# Patient Record
Sex: Male | Born: 1950 | Race: Asian | Hispanic: No | Marital: Married | Smoking: Current some day smoker
Health system: Southern US, Community
[De-identification: ages and names within clinical notes are randomized; demographics above are authoritative.]

## PROBLEM LIST (undated history)

## (undated) DIAGNOSIS — I10 Essential (primary) hypertension: Secondary | ICD-10-CM

## (undated) DIAGNOSIS — M5416 Radiculopathy, lumbar region: Secondary | ICD-10-CM

## (undated) DIAGNOSIS — M4800 Spinal stenosis, site unspecified: Secondary | ICD-10-CM

## (undated) DIAGNOSIS — R269 Unspecified abnormalities of gait and mobility: Secondary | ICD-10-CM

## (undated) DIAGNOSIS — E1140 Type 2 diabetes mellitus with diabetic neuropathy, unspecified: Secondary | ICD-10-CM

## (undated) DIAGNOSIS — E119 Type 2 diabetes mellitus without complications: Secondary | ICD-10-CM

## (undated) HISTORY — DX: Essential (primary) hypertension: I10

## (undated) HISTORY — DX: Type 2 diabetes mellitus with diabetic neuropathy, unspecified: E11.40

## (undated) HISTORY — DX: Spinal stenosis, site unspecified: M48.00

## (undated) HISTORY — DX: Type 2 diabetes mellitus without complications: E11.9

## (undated) HISTORY — DX: Unspecified abnormalities of gait and mobility: R26.9

## (undated) HISTORY — DX: Radiculopathy, lumbar region: M54.16

## (undated) HISTORY — PX: NO PAST SURGERIES: SHX2092

---

## 2012-11-29 ENCOUNTER — Ambulatory Visit: Payer: Self-pay

## 2012-12-06 ENCOUNTER — Ambulatory Visit: Payer: No Typology Code available for payment source | Attending: Family Medicine | Admitting: Internal Medicine

## 2012-12-06 ENCOUNTER — Ambulatory Visit: Payer: Self-pay | Attending: Family Medicine

## 2012-12-06 VITALS — BP 125/81 | HR 69 | Temp 98.0°F | Resp 17 | Wt 236.4 lb

## 2012-12-06 DIAGNOSIS — E1110 Type 2 diabetes mellitus with ketoacidosis without coma: Secondary | ICD-10-CM

## 2012-12-06 LAB — CBC WITH DIFFERENTIAL/PLATELET
Basophils Absolute: 0 10*3/uL (ref 0.0–0.1)
Basophils Relative: 0 % (ref 0–1)
Eosinophils Absolute: 0.3 10*3/uL (ref 0.0–0.7)
Eosinophils Relative: 5 % (ref 0–5)
HCT: 39.6 % (ref 39.0–52.0)
Hemoglobin: 13.5 g/dL (ref 13.0–17.0)
Lymphocytes Relative: 26 % (ref 12–46)
Lymphs Abs: 1.6 10*3/uL (ref 0.7–4.0)
MCH: 29.9 pg (ref 26.0–34.0)
MCHC: 34.1 g/dL (ref 30.0–36.0)
MCV: 87.6 fL (ref 78.0–100.0)
Monocytes Absolute: 0.4 10*3/uL (ref 0.1–1.0)
Monocytes Relative: 7 % (ref 3–12)
Neutro Abs: 3.9 10*3/uL (ref 1.7–7.7)
Neutrophils Relative %: 62 % (ref 43–77)
Platelets: 243 10*3/uL (ref 150–400)
RBC: 4.52 MIL/uL (ref 4.22–5.81)
RDW: 14.7 % (ref 11.5–15.5)
WBC: 6.2 10*3/uL (ref 4.0–10.5)

## 2012-12-06 LAB — LIPID PANEL
Cholesterol: 128 mg/dL (ref 0–200)
HDL: 37 mg/dL — ABNORMAL LOW (ref >39)
LDL Cholesterol: 68 mg/dL (ref 0–99)
Total CHOL/HDL Ratio: 3.5 Ratio
Triglycerides: 115 mg/dL (ref <150)
VLDL: 23 mg/dL (ref 0–40)

## 2012-12-06 LAB — POCT GLYCOSYLATED HEMOGLOBIN (HGB A1C): Hemoglobin A1C: 5.9

## 2012-12-06 LAB — HEMOGLOBIN A1C
Hgb A1c MFr Bld: 6.1 % — ABNORMAL HIGH (ref <5.7)
Mean Plasma Glucose: 128 mg/dL — ABNORMAL HIGH (ref <117)

## 2012-12-06 LAB — GLUCOSE, POCT (MANUAL RESULT ENTRY): POC Glucose: 81 mg/dl (ref 70–99)

## 2012-12-06 MED ORDER — LISINOPRIL 10 MG PO TABS: 10.0000 mg | ORAL_TABLET | Freq: Every day | ORAL | Status: DC

## 2012-12-06 MED ORDER — METFORMIN HCL 1000 MG PO TABS: 1000.0000 mg | ORAL_TABLET | Freq: Two times a day (BID) | ORAL | Status: DC

## 2012-12-06 MED ORDER — GLIPIZIDE ER 10 MG PO TB24: 10.0000 mg | ORAL_TABLET | Freq: Two times a day (BID) | ORAL | Status: DC

## 2012-12-06 NOTE — Progress Notes (Unsigned)
Patient has history of HTN and DM Needs medication refill

## 2012-12-06 NOTE — Progress Notes (Unsigned)
Patient ID: Michael Ashley, male   DOB: 1950/07/28, 62 y.o.   MRN: [id number]  CC:  HPI: 62 year old male who is here for establishing care, history of hypertension and diabetes, he needs medication refill. He states that he has been seeing a neurosurgeon in [facility], and was diagnosed with lumbar spinal stenosis. He has pain in his back when he walks, which is improved when he is bending forward. He denies any chest pain any shortness of breath. He wants to try physical therapy, also requesting referral to see a neurosurgeon in [place]  No Known Allergies Past Medical History  Diagnosis Date  . Diabetes mellitus without complication   . Hypertension    No current outpatient prescriptions on file prior to visit.   No current facility-administered medications on file prior to visit.   History reviewed. No pertinent family history. History   Social History  . Marital Status: Married    Spouse Name: N/A    Number of Children: N/A  . Years of Education: N/A   Occupational History  . Not on file.   Social History Main Topics  . Smoking status: Not on file  . Smokeless tobacco: Not on file  . Alcohol Use: Not on file  . Drug Use: Not on file  . Sexually Active: Not on file   Other Topics Concern  . Not on file   Social History Narrative  . No narrative on file    Review of Systems  Constitutional: Negative for fever, chills, diaphoresis, activity change, appetite change and fatigue.  HENT: Negative for ear pain, nosebleeds, congestion, facial swelling, rhinorrhea, neck pain, neck stiffness and ear discharge.   Eyes: Negative for pain, discharge, redness, itching and visual disturbance.  Respiratory: Negative for cough, choking, chest tightness, shortness of breath, wheezing and stridor.   Cardiovascular: Negative for chest pain, palpitations and leg swelling.  Gastrointestinal: Negative for abdominal distention.  Genitourinary: Negative for dysuria, urgency, frequency,  hematuria, flank pain, decreased urine volume, difficulty urinating and dyspareunia.  Musculoskeletal: Negative for back pain, joint swelling, arthralgias and gait problem.  Neurological: Negative for dizziness, tremors, seizures, syncope, facial asymmetry, speech difficulty, weakness, light-headedness, numbness and headaches.  Hematological: Negative for adenopathy. Does not bruise/bleed easily.  Psychiatric/Behavioral: Negative for hallucinations, behavioral problems, confusion, dysphoric mood, decreased concentration and agitation.    Objective:   Filed Vitals:   12/06/12 1047  BP: 125/81  Pulse: 69  Temp: 98 F (36.7 C)  Resp: 17    Physical Exam  Constitutional: Appears well-developed and well-nourished. No distress.  HENT: Normocephalic. External right and left ear normal. Oropharynx is clear and moist.  Eyes: Conjunctivae and EOM are normal. PERRLA, no scleral icterus.  Neck: Normal ROM. Neck supple. No JVD. No tracheal deviation. No thyromegaly.  CVS: RRR, S1/S2 +, no murmurs, no gallops, no carotid bruit.  Pulmonary: Effort and breath sounds normal, no stridor, rhonchi, wheezes, rales.  Abdominal: Soft. BS +,  no distension, tenderness, rebound or guarding.  Musculoskeletal: Normal range of motion. No edema and no tenderness.  Lymphadenopathy: No lymphadenopathy noted, cervical, inguinal. Neuro: Alert. Normal reflexes, muscle tone coordination. No cranial nerve deficit. Skin: Skin is warm and dry. No rash noted. Not diaphoretic. No erythema. No pallor.  Psychiatric: Normal mood and affect. Behavior, judgment, thought content normal.   No results found for this basename: WBC, HGB, HCT, MCV, PLT   No results found for this basename: CREATININE, BUN, NA, K, CL, CO2    Lab Results  Component  Value Date   HGBA1C 5.9% 12/06/2012   Lipid Panel  No results found for this basename: chol, trig, hdl, cholhdl, vldl, ldlcalc       Assessment and plan:   There are no active  problems to display for this patient.      Lumbar spinal stenosis MRI done in [place] Will obtain records Neurosurgery referral Outpatient physical therapy referral  Hypertension Continue lisinopril  Diabetes continue metformin and Glucotrol

## 2012-12-09 LAB — AMBIG ABBREV CMP14 DEFAULT

## 2012-12-10 LAB — COMPREHENSIVE METABOLIC PANEL
ALT: 36 IU/L (ref 0–44)
AST: 38 IU/L (ref 0–40)
Albumin/Globulin Ratio: 1.2 (ref 1.1–2.5)
Albumin: 4.1 g/dL (ref 3.6–4.8)
Alkaline Phosphatase: 64 IU/L (ref 39–117)
BUN/Creatinine Ratio: 22 (ref 10–22)
BUN: 20 mg/dL (ref 8–27)
CO2: 24 mmol/L (ref 18–29)
Calcium: 9.3 mg/dL (ref 8.6–10.2)
Chloride: 103 mmol/L (ref 97–108)
Creatinine, Ser: 0.89 mg/dL (ref 0.76–1.27)
GFR calc Af Amer: 104 mL/min/{1.73_m2} (ref >59)
GFR calc non Af Amer: 90 mL/min/{1.73_m2} (ref >59)
Globulin, Total: 3.4 g/dL (ref 1.5–4.5)
Glucose: 80 mg/dL (ref 65–99)
Potassium: 4.1 mmol/L (ref 3.5–5.2)
Sodium: 140 mmol/L (ref 134–144)
Total Bilirubin: 0.4 mg/dL (ref 0.0–1.2)
Total Protein: 7.5 g/dL (ref 6.0–8.5)

## 2012-12-10 LAB — SPECIMEN STATUS REPORT

## 2012-12-13 ENCOUNTER — Ambulatory Visit: Payer: No Typology Code available for payment source | Attending: Internal Medicine | Admitting: Physical Therapy

## 2012-12-13 DIAGNOSIS — R269 Unspecified abnormalities of gait and mobility: Secondary | ICD-10-CM | POA: Insufficient documentation

## 2012-12-13 DIAGNOSIS — IMO0001 Reserved for inherently not codable concepts without codable children: Secondary | ICD-10-CM | POA: Insufficient documentation

## 2012-12-15 ENCOUNTER — Ambulatory Visit: Payer: No Typology Code available for payment source

## 2012-12-20 ENCOUNTER — Ambulatory Visit: Payer: No Typology Code available for payment source | Attending: Internal Medicine | Admitting: Internal Medicine

## 2012-12-20 VITALS — BP 117/73 | HR 75 | Temp 98.0°F | Resp 16 | Wt 235.2 lb

## 2012-12-20 DIAGNOSIS — E1310 Other specified diabetes mellitus with ketoacidosis without coma: Secondary | ICD-10-CM

## 2012-12-20 DIAGNOSIS — I10 Essential (primary) hypertension: Secondary | ICD-10-CM | POA: Insufficient documentation

## 2012-12-20 DIAGNOSIS — E1110 Type 2 diabetes mellitus with ketoacidosis without coma: Secondary | ICD-10-CM | POA: Insufficient documentation

## 2012-12-20 LAB — GLUCOSE, POCT (MANUAL RESULT ENTRY): POC Glucose: 116 mg/dl — AB (ref 70–99)

## 2012-12-20 NOTE — Progress Notes (Signed)
Patient here for follow up on his Diabetes Blood sugar today is 116

## 2012-12-20 NOTE — Progress Notes (Signed)
Patient ID: Michael Ashley, male   DOB: 11-10-1950, 62 y.o.   MRN: [id number]  CC: Followup  HPI: Patient is a 62 years of pleasant man who came today for followup. He was instructed to call for blood sugar check, on metformin, recent hemoglobin A1c is 6.1. Patient has no complaint today. His back pain is better, he has a neurosurgery appointment in 2 weeks. 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 He does not smoke cigarette, he does not drink alcohol. Minimal exercise due to pain. No Known Allergies Past Medical History  Diagnosis Date  . Diabetes mellitus without complication   . Hypertension    Current Outpatient Prescriptions on File Prior to Visit  Medication Sig Dispense Refill  . glipiZIDE (GLUCOTROL XL) 10 MG 24 hr tablet Take 1 tablet (10 mg total) by mouth 2 (two) times daily.  60 tablet  3  . lisinopril (PRINIVIL,ZESTRIL) 10 MG tablet Take 1 tablet (10 mg total) by mouth daily.  60 tablet  3  . metFORMIN (GLUCOPHAGE) 1000 MG tablet Take 1 tablet (1,000 mg total) by mouth 2 (two) times daily with a meal.  60 tablet  3   No current facility-administered medications on file prior to visit.   History reviewed. No pertinent family history. History   Social History  . Marital Status: Married    Spouse Name: N/A    Number of Children: N/A  . Years of Education: N/A   Occupational History  . Not on file.   Social History Main Topics  . Smoking status: Never Smoker   . Smokeless tobacco: Not on file  . Alcohol Use: Not on file  . Drug Use: Not on file  . Sexual Activity: Not on file   Other Topics Concern  . Not on file   Social History Narrative  . No narrative on file    Review of Systems: Constitutional: Negative for fever, chills, diaphoresis, activity change, appetite change and fatigue. HENT: Negative for ear pain, nosebleeds, congestion, facial swelling, rhinorrhea, neck pain, neck stiffness and ear discharge.  Eyes: Negative for pain, discharge, redness,  itching and visual disturbance. Respiratory: Negative for cough, choking, chest tightness, shortness of breath, wheezing and stridor.  Cardiovascular: Negative for chest pain, palpitations and leg swelling. Gastrointestinal: Negative for abdominal distention. Genitourinary: Negative for dysuria, urgency, frequency, hematuria, flank pain, decreased urine volume, difficulty urinating and dyspareunia.  Musculoskeletal: Negative for back pain, joint swelling, arthralgias and gait problem. Neurological: Negative for dizziness, tremors, seizures, syncope, facial asymmetry, speech difficulty, weakness, light-headedness, numbness and headaches.  Hematological: Negative for adenopathy. Does not bruise/bleed easily. Psychiatric/Behavioral: Negative for hallucinations, behavioral problems, confusion, dysphoric mood, decreased concentration and agitation.    Objective:   Filed Vitals:   12/20/12 1030  BP: 117/73  Pulse: 75  Temp: 98 F (36.7 C)  Resp: 16    Physical Exam: Constitutional: Patient appears well-developed and well-nourished. No distress. HENT: Normocephalic, atraumatic, External right and left ear normal. Oropharynx is clear and moist.  Eyes: Conjunctivae and EOM are normal. PERRLA, no scleral icterus. Neck: Normal ROM. Neck supple. No JVD. No tracheal deviation. No thyromegaly. CVS: RRR, S1/S2 +, no murmurs, no gallops, no carotid bruit.  Pulmonary: Effort and breath sounds normal, no stridor, rhonchi, wheezes, rales.  Abdominal: Soft. BS +,  no distension, tenderness, rebound or guarding.  Musculoskeletal: Normal range of motion. No edema and no tenderness.  Lymphadenopathy: No lymphadenopathy noted, cervical, inguinal or axillary Neuro: Alert. Normal reflexes, muscle tone  coordination. No cranial nerve deficit. Skin: Skin is warm and dry. No rash noted. Not diaphoretic. No erythema. No pallor. Psychiatric: Normal mood and affect. Behavior, judgment, thought content  normal.  Lab Results  Component Value Date   WBC 6.2 12/06/2012   HGB 13.5 12/06/2012   HCT 39.6 12/06/2012   MCV 87.6 12/06/2012   PLT 243 12/06/2012   Lab Results  Component Value Date   CREATININE 0.89 12/06/2012   BUN 20 12/06/2012   NA 140 12/06/2012   K 4.1 12/06/2012   CL 103 12/06/2012   CO2 24 12/06/2012    Lab Results  Component Value Date   HGBA1C 6.1* 12/06/2012   Lipid Panel     Component Value Date/Time   CHOL 128 12/06/2012 1119   TRIG 115 12/06/2012 1119   HDL 37* 12/06/2012 1119   CHOLHDL 3.5 12/06/2012 1119   VLDL 23 12/06/2012 1119   LDLCALC 68 12/06/2012 1119       Assessment and plan:   Patient Active Problem List   Diagnosis Date Noted  . DM (diabetes mellitus) type 2, uncontrolled, with ketoacidosis 12/20/2012  . Essential hypertension, benign 12/20/2012   Patient doing well, encouraged to continue her present regimen of medication Glipizide XL10 mg by mouth daily Lisinopril 10 mg tablet by mouth daily Metformin 1000 mg tablet by mouth twice a day     Patient was counseled on nutrition and exercise  Michael Ashley was given clear instructions to go to ER or return to the clinic if symptoms don't improve, worsen or new problems develop.  Michael Ashley verbalized understanding.  Michael Ashley was told to call to get lab results if hasn't heard anything in the next week.    Jeanann Lewandowsky, MD [facility] [place], [place] [phone]   12/20/2012, 10:59 AM

## 2012-12-22 ENCOUNTER — Ambulatory Visit: Payer: No Typology Code available for payment source

## 2012-12-27 ENCOUNTER — Ambulatory Visit: Payer: No Typology Code available for payment source | Admitting: Rehabilitation

## 2012-12-29 ENCOUNTER — Ambulatory Visit: Payer: No Typology Code available for payment source | Admitting: Rehabilitation

## 2013-01-03 ENCOUNTER — Ambulatory Visit: Payer: No Typology Code available for payment source | Attending: Internal Medicine

## 2013-01-03 DIAGNOSIS — IMO0001 Reserved for inherently not codable concepts without codable children: Secondary | ICD-10-CM | POA: Insufficient documentation

## 2013-01-03 DIAGNOSIS — R269 Unspecified abnormalities of gait and mobility: Secondary | ICD-10-CM | POA: Insufficient documentation

## 2013-01-05 ENCOUNTER — Ambulatory Visit: Payer: No Typology Code available for payment source

## 2013-01-10 ENCOUNTER — Ambulatory Visit: Payer: No Typology Code available for payment source

## 2013-01-12 ENCOUNTER — Ambulatory Visit: Payer: No Typology Code available for payment source | Admitting: Rehabilitation

## 2013-01-19 ENCOUNTER — Ambulatory Visit: Payer: No Typology Code available for payment source

## 2013-03-21 ENCOUNTER — Ambulatory Visit: Payer: No Typology Code available for payment source | Attending: Internal Medicine | Admitting: Internal Medicine

## 2013-03-21 ENCOUNTER — Encounter: Payer: Self-pay | Admitting: Internal Medicine

## 2013-03-21 VITALS — BP 114/76 | HR 80 | Temp 98.3°F | Ht 70.0 in | Wt 240.4 lb

## 2013-03-21 DIAGNOSIS — E1110 Type 2 diabetes mellitus with ketoacidosis without coma: Secondary | ICD-10-CM

## 2013-03-21 DIAGNOSIS — E1310 Other specified diabetes mellitus with ketoacidosis without coma: Secondary | ICD-10-CM

## 2013-03-21 DIAGNOSIS — I10 Essential (primary) hypertension: Secondary | ICD-10-CM

## 2013-03-21 LAB — POCT GLYCOSYLATED HEMOGLOBIN (HGB A1C): Hemoglobin A1C: 6.5

## 2013-03-21 NOTE — Progress Notes (Signed)
Pt is here for office visit on a follow up from his last visit inquiring foot therapy. Pt wants to apply for a handicap sticker.

## 2013-03-21 NOTE — Progress Notes (Signed)
Patient ID: Michael Ashley, male   DOB: 1951/02/03, 62 y.o.   MRN: [id number] Patient Demographics  Michael Ashley, is a 62 y.o. male  [name]  [name]  DOB - 1950/10/23  Chief Complaint  Patient presents with  . office visit    follow up from last visit about foot therapy        Subjective:   Michael Ashley is a 62 y.o. male here today for a follow up visit. Patient has no complaints. Like to get a handicap sticker for packing. He finished physical therapy for his low back pain, doing much better. He walks is still present with [employer], [employer]. He does not smoke cigarette. He does not drink alcohol Patient has No headache, No chest pain, No abdominal pain - No Nausea, No new weakness tingling or numbness, No Cough - SOB.  ALLERGIES: No Known Allergies  PAST MEDICAL HISTORY: Past Medical History  Diagnosis Date  . Diabetes mellitus without complication   . Hypertension     MEDICATIONS AT HOME: Prior to Admission medications   Medication Sig Start Date End Date Taking? Authorizing Provider  glipiZIDE (GLUCOTROL XL) 10 MG 24 hr tablet Take 1 tablet (10 mg total) by mouth 2 (two) times daily. 12/06/12  Yes Richarda Overlie, MD  lisinopril (PRINIVIL,ZESTRIL) 10 MG tablet Take 1 tablet (10 mg total) by mouth daily. 12/06/12  Yes Richarda Overlie, MD  metFORMIN (GLUCOPHAGE) 1000 MG tablet Take 1 tablet (1,000 mg total) by mouth 2 (two) times daily with a meal. 12/06/12  Yes Richarda Overlie, MD     Objective:   Filed Vitals:   03/21/13 0958  BP: 114/76  Pulse: 80  Temp: 98.3 F (36.8 C)  TempSrc: Oral  Height: 5\' 10"  (1.778 m)  Weight: 240 lb 6.4 oz (109.045 kg)  SpO2: 96%    Exam General appearance : Awake, alert, not in any distress. Speech Clear. Not toxic looking, obese HEENT: Atraumatic and Normocephalic, pupils equally reactive to light and accomodation Neck: supple, no JVD. No cervical lymphadenopathy.  Chest:Good air entry bilaterally, no added sounds  CVS: S1 S2  regular, no murmurs.  Abdomen: Bowel sounds present, Non tender and not distended with no gaurding, rigidity or rebound. Extremities: B/L Lower Ext shows no edema, both legs are warm to touch Neurology: Awake alert, and oriented X 3, CN II-XII intact, Non focal Skin:No Rash Wounds:N/A   Data Review   CBC No results found for this basename: WBC, HGB, HCT, PLT, MCV, MCH, MCHC, RDW, NEUTRABS, LYMPHSABS, MONOABS, EOSABS, BASOSABS, BANDABS, BANDSABD,  in the last 168 hours  Chemistries   No results found for this basename: NA, K, CL, CO2, GLUCOSE, BUN, CREATININE, GFRCGP, CALCIUM, MG, AST, ALT, ALKPHOS, BILITOT,  in the last 168 hours ------------------------------------------------------------------------------------------------------------------ No results found for this basename: HGBA1C,  in the last 72 hours ------------------------------------------------------------------------------------------------------------------ No results found for this basename: CHOL, HDL, LDLCALC, TRIG, CHOLHDL, LDLDIRECT,  in the last 72 hours ------------------------------------------------------------------------------------------------------------------ No results found for this basename: TSH, T4TOTAL, FREET3, T3FREE, THYROIDAB,  in the last 72 hours ------------------------------------------------------------------------------------------------------------------ No results found for this basename: VITAMINB12, FOLATE, FERRITIN, TIBC, IRON, RETICCTPCT,  in the last 72 hours  Coagulation profile  No results found for this basename: INR, PROTIME,  in the last 168 hours    Assessment & Plan   Patient Active Problem List   Diagnosis Date Noted  . DM (diabetes mellitus) type 2, uncontrolled, with ketoacidosis 12/20/2012  . Essential hypertension, benign 12/20/2012   Hemoglobin A1c today  is 6.5 slightly higher than 3 months ago Patient claims blood sugar at home ranges between 90 and  130  Plan: Continue glipizide 10 mg tablet by mouth daily Continue metformin 1000 mg tablet by mouth twice a day  Current lisinopril 10 mg tablet by mouth daily  Patient again counseled extensively about nutrition and exercise  Health Maintenance -Colonoscopy: Patient declined today, saying he will try next appointment -Vaccinations:  -Influenza already given  Follow up in 3-6 months or when necessary  The patient was given clear instructions to go to ER or return to medical center if symptoms don't improve, worsen or new problems develop. The patient verbalized understanding. The patient was told to call to get lab results if they haven't heard anything in the next week.    Jeanann Lewandowsky, MD, MHA, FACP, FAAP [facility] [place], [place] [phone]   03/21/2013, 10:23 AM

## 2013-03-21 NOTE — Patient Instructions (Signed)
DASH Diet The DASH diet stands for "Dietary Approaches to Stop Hypertension." It is a healthy eating plan that has been shown to reduce high blood pressure (hypertension) in as little as 14 days, while also possibly providing other significant health benefits. These other health benefits include reducing the risk of breast cancer after menopause and reducing the risk of type 2 diabetes, heart disease, colon cancer, and stroke. Health benefits also include weight loss and slowing kidney failure in patients with chronic kidney disease.  DIET GUIDELINES  Limit salt (sodium). Your diet should contain less than 1500 mg of sodium daily.  Limit refined or processed carbohydrates. Your diet should include mostly whole grains. Desserts and added sugars should be used sparingly.  Include small amounts of heart-healthy fats. These types of fats include nuts, oils, and tub margarine. Limit saturated and trans fats. These fats have been shown to be harmful in the body. CHOOSING FOODS  The following food groups are based on a 2000 calorie diet. See your Registered Dietitian for individual calorie needs. Grains and Grain Products (6 to 8 servings daily)  Eat More Often: Whole-wheat bread, brown rice, whole-grain or wheat pasta, quinoa, popcorn without added fat or salt (air popped).  Eat Less Often: White bread, white pasta, white rice, cornbread. Vegetables (4 to 5 servings daily)  Eat More Often: Fresh, frozen, and canned vegetables. Vegetables may be raw, steamed, roasted, or grilled with a minimal amount of fat.  Eat Less Often/Avoid: Creamed or fried vegetables. Vegetables in a cheese sauce. Fruit (4 to 5 servings daily)  Eat More Often: All fresh, canned (in natural juice), or frozen fruits. Dried fruits without added sugar. One hundred percent fruit juice ( cup [237 mL] daily).  Eat Less Often: Dried fruits with added sugar. Canned fruit in light or heavy syrup. Lean Meats, Fish, and Poultry (2  servings or less daily. One serving is 3 to 4 oz [85-114 g]).  Eat More Often: Ninety percent or leaner ground beef, tenderloin, sirloin. Round cuts of beef, chicken breast, turkey breast. All fish. Grill, bake, or broil your meat. Nothing should be fried.  Eat Less Often/Avoid: Fatty cuts of meat, turkey, or chicken leg, thigh, or wing. Fried cuts of meat or fish. Dairy (2 to 3 servings)  Eat More Often: Low-fat or fat-free milk, low-fat plain or light yogurt, reduced-fat or part-skim cheese.  Eat Less Often/Avoid: Milk (whole, 2%).Whole milk yogurt. Full-fat cheeses. Nuts, Seeds, and Legumes (4 to 5 servings per week)  Eat More Often: All without added salt.  Eat Less Often/Avoid: Salted nuts and seeds, canned beans with added salt. Fats and Sweets (limited)  Eat More Often: Vegetable oils, tub margarines without trans fats, sugar-free gelatin. Mayonnaise and salad dressings.  Eat Less Often/Avoid: Coconut oils, palm oils, butter, stick margarine, cream, half and half, cookies, candy, pie. FOR MORE INFORMATION The Dash Diet Eating Plan: www.dashdiet.org Document Released: 04/09/2011 Document Revised: 07/13/2011 Document Reviewed: 04/09/2011 ExitCare Patient Information 2014 [employer]. Diabetes and Exercise Exercising regularly is important. It is not just about losing weight. It has many health benefits, such as:  Improving your overall fitness, flexibility, and endurance.  Increasing your bone density.  Helping with weight control.  Decreasing your body fat.  Increasing your muscle strength.  Reducing stress and tension.  Improving your overall health. People with diabetes who exercise gain additional benefits because exercise:  Reduces appetite.  Improves the body's use of blood sugar (glucose).  Helps lower or control blood glucose.    Decreases blood pressure.  Helps control blood lipids (such as cholesterol and triglycerides).  Improves the body's use  of the hormone insulin by:  Increasing the body's insulin sensitivity.  Reducing the body's insulin needs.  Decreases the risk for heart disease because exercising:  Lowers cholesterol and triglycerides levels.  Increases the levels of good cholesterol (such as high-density lipoproteins [HDL]) in the body.  Lowers blood glucose levels. YOUR ACTIVITY PLAN  Choose an activity that you enjoy and set realistic goals. Your health care provider or diabetes educator can help you make an activity plan that works for you. You can break activities into 2 or 3 sessions throughout the day. Doing so is as good as one long session. Exercise ideas include:  Taking the dog for a walk.  Taking the stairs instead of the elevator.  Dancing to your favorite song.  Doing your favorite exercise with a friend. RECOMMENDATIONS FOR EXERCISING WITH TYPE 1 OR TYPE 2 DIABETES   Check your blood glucose before exercising. If blood glucose levels are greater than 240 mg/dL, check for urine ketones. Do not exercise if ketones are present.  Avoid injecting insulin into areas of the body that are going to be exercised. For example, avoid injecting insulin into:  The arms when playing tennis.  The legs when jogging.  Keep a record of:  Food intake before and after you exercise.  Expected peak times of insulin action.  Blood glucose levels before and after you exercise.  The type and amount of exercise you have done.  Review your records with your health care provider. Your health care provider will help you to develop guidelines for adjusting food intake and insulin amounts before and after exercising.  If you take insulin or oral hypoglycemic agents, watch for signs and symptoms of hypoglycemia. They include:  Dizziness.  Shaking.  Sweating.  Chills.  Confusion.  Drink plenty of water while you exercise to prevent dehydration or heat stroke. Body water is lost during exercise and must be  replaced.  Talk to your health care provider before starting an exercise program to make sure it is safe for you. Remember, almost any type of activity is better than none. Document Released: 07/11/2003 Document Revised: 12/21/2012 Document Reviewed: 09/27/2012 ExitCare Patient Information 2014 [employer].  

## 2013-03-22 ENCOUNTER — Ambulatory Visit: Payer: No Typology Code available for payment source

## 2013-05-25 ENCOUNTER — Ambulatory Visit: Payer: No Typology Code available for payment source | Attending: Internal Medicine

## 2013-08-22 ENCOUNTER — Ambulatory Visit: Payer: Self-pay | Admitting: Physician Assistant

## 2013-08-22 ENCOUNTER — Telehealth: Payer: Self-pay | Admitting: *Deleted

## 2013-08-22 ENCOUNTER — Other Ambulatory Visit (INDEPENDENT_AMBULATORY_CARE_PROVIDER_SITE_OTHER): Payer: No Typology Code available for payment source

## 2013-08-22 ENCOUNTER — Ambulatory Visit (INDEPENDENT_AMBULATORY_CARE_PROVIDER_SITE_OTHER): Payer: No Typology Code available for payment source | Admitting: Internal Medicine

## 2013-08-22 ENCOUNTER — Encounter: Payer: Self-pay | Admitting: Internal Medicine

## 2013-08-22 VITALS — BP 128/70 | HR 70 | Temp 97.3°F | Resp 16 | Ht 70.0 in | Wt 246.0 lb

## 2013-08-22 DIAGNOSIS — I10 Essential (primary) hypertension: Secondary | ICD-10-CM

## 2013-08-22 DIAGNOSIS — M48062 Spinal stenosis, lumbar region with neurogenic claudication: Secondary | ICD-10-CM | POA: Insufficient documentation

## 2013-08-22 DIAGNOSIS — E1165 Type 2 diabetes mellitus with hyperglycemia: Principal | ICD-10-CM

## 2013-08-22 DIAGNOSIS — E785 Hyperlipidemia, unspecified: Secondary | ICD-10-CM

## 2013-08-22 DIAGNOSIS — E118 Type 2 diabetes mellitus with unspecified complications: Secondary | ICD-10-CM | POA: Insufficient documentation

## 2013-08-22 DIAGNOSIS — M48061 Spinal stenosis, lumbar region without neurogenic claudication: Secondary | ICD-10-CM

## 2013-08-22 DIAGNOSIS — IMO0001 Reserved for inherently not codable concepts without codable children: Secondary | ICD-10-CM

## 2013-08-22 DIAGNOSIS — Z23 Encounter for immunization: Secondary | ICD-10-CM

## 2013-08-22 DIAGNOSIS — G4700 Insomnia, unspecified: Secondary | ICD-10-CM

## 2013-08-22 LAB — BASIC METABOLIC PANEL
BUN: 17 mg/dL (ref 6–23)
CO2: 28 mEq/L (ref 19–32)
Calcium: 9.5 mg/dL (ref 8.4–10.5)
Chloride: 102 mEq/L (ref 96–112)
Creatinine, Ser: 1.1 mg/dL (ref 0.4–1.5)
GFR: 75.4 mL/min (ref >60.00)
Glucose, Bld: 96 mg/dL (ref 70–99)
Potassium: 4.2 mEq/L (ref 3.5–5.1)
Sodium: 139 mEq/L (ref 135–145)

## 2013-08-22 LAB — URINALYSIS, ROUTINE W REFLEX MICROSCOPIC
Bilirubin Urine: NEGATIVE
Hgb urine dipstick: NEGATIVE
Ketones, ur: NEGATIVE
Leukocytes, UA: NEGATIVE
Nitrite: NEGATIVE
Specific Gravity, Urine: 1.025 (ref 1.000–1.030)
Total Protein, Urine: NEGATIVE
Urine Glucose: NEGATIVE
Urobilinogen, UA: 0.2 (ref 0.0–1.0)
pH: 5 (ref 5.0–8.0)

## 2013-08-22 LAB — TSH: TSH: 2.25 u[IU]/mL (ref 0.35–5.50)

## 2013-08-22 LAB — HEMOGLOBIN A1C: Hgb A1c MFr Bld: 7.1 % — ABNORMAL HIGH (ref 4.6–6.5)

## 2013-08-22 NOTE — Telephone Encounter (Signed)
Which pharmacy?

## 2013-08-22 NOTE — Progress Notes (Signed)
Subjective:    Patient ID: Michael Ashley, male    DOB: [birth year], 63 y.o.   MRN: [id number]  HPI Comments: New to me. He has a congenital lumbar spinal cord disorder that is causing him to have weakness and incoordination in his legs and feet. He saw a neurosurgeon in [place] nearly a year ago and was told that he should have surgery to repair this. He asks me to see him to someone for a second opinion.  Diabetes He presents for his follow-up diabetic visit. He has type 2 diabetes mellitus. His disease course has been stable. There are no hypoglycemic associated symptoms. Associated symptoms include weakness (in his legs and feet). Pertinent negatives for diabetes include no blurred vision, no chest pain, no fatigue, no foot paresthesias, no foot ulcerations, no polydipsia, no polyphagia, no polyuria, no visual change and no weight loss. There are no hypoglycemic complications. There are no diabetic complications. Current diabetic treatment includes oral agent (dual therapy). He is compliant with treatment all of the time. He is following a generally healthy diet. Meal planning includes avoidance of concentrated sweets. He participates in exercise intermittently. There is no change in his home blood glucose trend. An ACE inhibitor/angiotensin II receptor blocker is being taken. He does not see a podiatrist.Eye exam is not current.      Review of Systems  Constitutional: Negative for fever, chills, weight loss, diaphoresis, appetite change and fatigue.  HENT: Negative.   Eyes: Negative.  Negative for blurred vision.  Respiratory: Negative.  Negative for cough, choking, chest tightness, shortness of breath and stridor.   Cardiovascular: Negative.  Negative for chest pain, palpitations and leg swelling.  Gastrointestinal: Negative.  Negative for nausea, vomiting, abdominal pain, diarrhea, constipation and blood in stool.  Endocrine: Negative.  Negative for polydipsia, polyphagia and polyuria.    Genitourinary: Negative.   Musculoskeletal: Negative.  Negative for arthralgias, back pain, gait problem, joint swelling, myalgias, neck pain and neck stiffness.  Skin: Negative.   Allergic/Immunologic: Negative.   Neurological: Positive for weakness (in his legs and feet).  Hematological: Negative.  Negative for adenopathy. Does not bruise/bleed easily.  Psychiatric/Behavioral: Negative.        Objective:   Physical Exam  Vitals reviewed. Constitutional: He is oriented to person, place, and time. He appears well-developed and well-nourished. No distress.  HENT:  Head: Normocephalic and atraumatic.  Mouth/Throat: Oropharynx is clear and moist. No oropharyngeal exudate.  Eyes: Conjunctivae are normal. Right eye exhibits no discharge. Left eye exhibits no discharge. No scleral icterus.  Neck: Normal range of motion. Neck supple. No JVD present. No tracheal deviation present. No thyromegaly present.  Cardiovascular: Normal rate, regular rhythm, normal heart sounds and intact distal pulses.  Exam reveals no gallop and no friction rub.   No murmur heard. Pulmonary/Chest: Effort normal and breath sounds normal. No stridor. No respiratory distress. He has no wheezes. He has no rales. He exhibits no tenderness.  Abdominal: Soft. Bowel sounds are normal. He exhibits no distension and no mass. There is no tenderness. There is no rebound and no guarding.  Musculoskeletal: Normal range of motion. He exhibits no edema and no tenderness.  Lymphadenopathy:    He has no cervical adenopathy.  Neurological: He is alert and oriented to person, place, and time. He displays atrophy. He displays no tremor and normal reflexes. No cranial nerve deficit or sensory deficit. He exhibits abnormal muscle tone. He displays a negative Romberg sign. He displays no seizure activity. Gait abnormal. Coordination  normal.  Reflex Scores:      Tricep reflexes are 1+ on the right side and 1+ on the left side.      Bicep  reflexes are 1+ on the right side and 1+ on the left side.      Brachioradialis reflexes are 1+ on the right side and 1+ on the left side.      Patellar reflexes are 1+ on the right side and 1+ on the left side.      Achilles reflexes are 1+ on the right side and 1+ on the left side. He walks in his heels. There is mild weakness in both legs and feet.  Skin: Skin is warm and dry. No rash noted. He is not diaphoretic. No erythema. No pallor.     Lab Results  Component Value Date   WBC 6.2 12/06/2012   HGB 13.5 12/06/2012   HCT 39.6 12/06/2012   PLT 243 12/06/2012   GLUCOSE 80 12/06/2012   CHOL 128 12/06/2012   TRIG 115 12/06/2012   HDL 37* 12/06/2012   LDLCALC 68 12/06/2012   ALT 36 12/06/2012   AST 38 12/06/2012   NA 140 12/06/2012   K 4.1 12/06/2012   CL 103 12/06/2012   CREATININE 0.89 12/06/2012   BUN 20 12/06/2012   CO2 24 12/06/2012   HGBA1C 6.5 03/21/2013       Assessment & Plan:

## 2013-08-22 NOTE — Telephone Encounter (Signed)
Pt called states he was to believe he was going to have a Rx for a sleep aide after OV.  Please advise

## 2013-08-22 NOTE — Patient Instructions (Signed)
Type 2 Diabetes Mellitus, Adult Type 2 diabetes mellitus, often simply referred to as type 2 diabetes, is a long-lasting (chronic) disease. In type 2 diabetes, the pancreas does not make enough insulin (a hormone), the cells are less responsive to the insulin that is made (insulin resistance), or both. Normally, insulin moves sugars from food into the tissue cells. The tissue cells use the sugars for energy. The lack of insulin or the lack of normal response to insulin causes excess sugars to build up in the blood instead of going into the tissue cells. As a result, high blood sugar (hyperglycemia) develops. The effect of high sugar (glucose) levels can cause many complications. Type 2 diabetes was also previously called adult-onset diabetes but it can occur at any age.  RISK FACTORS  A person is predisposed to developing type 2 diabetes if someone in the family has the disease and also has one or more of the following primary risk factors:  Overweight.  An inactive lifestyle.  A history of consistently eating high-calorie foods. Maintaining a normal weight and regular physical activity can reduce the chance of developing type 2 diabetes. SYMPTOMS  A person with type 2 diabetes may not show symptoms initially. The symptoms of type 2 diabetes appear slowly. The symptoms include:  Increased thirst (polydipsia).  Increased urination (polyuria).  Increased urination during the night (nocturia).  Weight loss. This weight loss may be rapid.  Frequent, recurring infections.  Tiredness (fatigue).  Weakness.  Vision changes, such as blurred vision.  Fruity smell to your breath.  Abdominal pain.  Nausea or vomiting.  Cuts or bruises which are slow to heal.  Tingling or numbness in the hands or feet. DIAGNOSIS Type 2 diabetes is frequently not diagnosed until complications of diabetes are present. Type 2 diabetes is diagnosed when symptoms or complications are present and when blood  glucose levels are increased. Your blood glucose level may be checked by one or more of the following blood tests:  A fasting blood glucose test. You will not be allowed to eat for at least 8 hours before a blood sample is taken.  A random blood glucose test. Your blood glucose is checked at any time of the day regardless of when you ate.  A hemoglobin A1c blood glucose test. A hemoglobin A1c test provides information about blood glucose control over the previous 3 months.  An oral glucose tolerance test (OGTT). Your blood glucose is measured after you have not eaten (fasted) for 2 hours and then after you drink a glucose-containing beverage. TREATMENT   You may need to take insulin or diabetes medicine daily to keep blood glucose levels in the desired range.  You will need to match insulin dosing with exercise and healthy food choices. The treatment goal is to maintain the before meal blood sugar (preprandial glucose) level at 70 130 mg/dL. HOME CARE INSTRUCTIONS   Have your hemoglobin A1c level checked twice a year.  Perform daily blood glucose monitoring as directed by your caregiver.  Monitor urine ketones when you are ill and as directed by your caregiver.  Take your diabetes medicine or insulin as directed by your caregiver to maintain your blood glucose levels in the desired range.  Never run out of diabetes medicine or insulin. It is needed every day.  Adjust insulin based on your intake of carbohydrates. Carbohydrates can raise blood glucose levels but need to be included in your diet. Carbohydrates provide vitamins, minerals, and fiber which are an essential part of   a healthy diet. Carbohydrates are found in fruits, vegetables, whole grains, dairy products, legumes, and foods containing added sugars.    Eat healthy foods. Alternate 3 meals with 3 snacks.  Lose weight if overweight.  Carry a medical alert card or wear your medical alert jewelry.  Carry a 15 gram  carbohydrate snack with you at all times to treat low blood glucose (hypoglycemia). Some examples of 15 gram carbohydrate snacks include:  Glucose tablets, 3 or 4   Glucose gel, 15 gram tube  Raisins, 2 tablespoons (24 grams)  Jelly beans, 6  Animal crackers, 8  Regular pop, 4 ounces (120 mL)  Gummy treats, 9  Recognize hypoglycemia. Hypoglycemia occurs with blood glucose levels of 70 mg/dL and below. The risk for hypoglycemia increases when fasting or skipping meals, during or after intense exercise, and during sleep. Hypoglycemia symptoms can include:  Tremors or shakes.  Decreased ability to concentrate.  Sweating.  Increased heart rate.  Headache.  Dry mouth.  Hunger.  Irritability.  Anxiety.  Restless sleep.  Altered speech or coordination.  Confusion.  Treat hypoglycemia promptly. If you are alert and able to safely swallow, follow the 15:15 rule:  Take 15 20 grams of rapid-acting glucose or carbohydrate. Rapid-acting options include glucose gel, glucose tablets, or 4 ounces (120 mL) of fruit juice, regular soda, or low fat milk.  Check your blood glucose level 15 minutes after taking the glucose.  Take 15 20 grams more of glucose if the repeat blood glucose level is still 70 mg/dL or below.  Eat a meal or snack within 1 hour once blood glucose levels return to normal.    Be alert to polyuria and polydipsia which are early signs of hyperglycemia. An early awareness of hyperglycemia allows for prompt treatment. Treat hyperglycemia as directed by your caregiver.  Engage in at least 150 minutes of moderate-intensity physical activity a week, spread over at least 3 days of the week or as directed by your caregiver. In addition, you should engage in resistance exercise at least 2 times a week or as directed by your caregiver.  Adjust your medicine and food intake as needed if you start a new exercise or sport.  Follow your sick day plan at any time you  are unable to eat or drink as usual.  Avoid tobacco use.  Limit alcohol intake to no more than 1 drink per day for nonpregnant women and 2 drinks per day for men. You should drink alcohol only when you are also eating food. Talk with your caregiver whether alcohol is safe for you. Tell your caregiver if you drink alcohol several times a week.  Follow up with your caregiver regularly.  Schedule an eye exam soon after the diagnosis of type 2 diabetes and then annually.  Perform daily skin and foot care. Examine your skin and feet daily for cuts, bruises, redness, nail problems, bleeding, blisters, or sores. A foot exam by a caregiver should be done annually.  Brush your teeth and gums at least twice a day and floss at least once a day. Follow up with your dentist regularly.  Share your diabetes management plan with your workplace or school.  Stay up-to-date with immunizations.  Learn to manage stress.  Obtain ongoing diabetes education and support as needed.  Participate in, or seek rehabilitation as needed to maintain or improve independence and quality of life. Request a physical or occupational therapy referral if you are having foot or hand numbness or difficulties with grooming,   dressing, eating, or physical activity. SEEK MEDICAL CARE IF:   You are unable to eat food or drink fluids for more than 6 hours.  You have nausea and vomiting for more than 6 hours.  Your blood glucose level is over 240 mg/dL.  There is a change in mental status.  You develop an additional serious illness.  You have diarrhea for more than 6 hours.  You have been sick or have had a fever for a couple of days and are not getting better.  You have pain during any physical activity.  SEEK IMMEDIATE MEDICAL CARE IF:  You have difficulty breathing.  You have moderate to large ketone levels. MAKE SURE YOU:  Understand these instructions.  Will watch your condition.  Will get help right away if  you are not doing well or get worse. Document Released: 04/20/2005 Document Revised: 01/13/2012 Document Reviewed: 11/17/2011 [employer] Patient Information 2014 [employer].  

## 2013-08-22 NOTE — Progress Notes (Signed)
Pre visit review using our clinic review tool, if applicable. No additional management support is needed unless otherwise documented below in the visit note. 

## 2013-08-22 NOTE — Assessment & Plan Note (Signed)
He wants to get a second opinion

## 2013-08-23 ENCOUNTER — Telehealth: Payer: Self-pay | Admitting: Internal Medicine

## 2013-08-23 DIAGNOSIS — G4700 Insomnia, unspecified: Secondary | ICD-10-CM | POA: Insufficient documentation

## 2013-08-23 MED ORDER — DOXEPIN HCL 10 MG PO CAPS: 10.0000 mg | ORAL_CAPSULE | Freq: Every day | ORAL | Status: DC

## 2013-08-23 NOTE — Assessment & Plan Note (Signed)
His BP is well controlled I will check his lytes and renal function 

## 2013-08-23 NOTE — Addendum Note (Signed)
Addended by: Etta GrandchildJONES, Giulia Hickey L on: 08/23/2013 04:06 PM   Modules accepted: Orders

## 2013-08-23 NOTE — Telephone Encounter (Signed)
Relevant patient education assigned to patient using Emmi. ° °

## 2013-08-23 NOTE — Addendum Note (Signed)
Addended by: Etta GrandchildJONES, Valeree Leidy L on: 08/23/2013 04:05 PM   Modules accepted: Orders

## 2013-08-23 NOTE — Assessment & Plan Note (Signed)
He has achieved his LDL goal 

## 2013-08-23 NOTE — Assessment & Plan Note (Signed)
I will check his A1C today and will advise further

## 2013-08-23 NOTE — Telephone Encounter (Signed)
Spoke with pt he says he uses [facility].

## 2013-09-12 ENCOUNTER — Other Ambulatory Visit: Payer: Self-pay | Admitting: Neurosurgery

## 2013-09-12 DIAGNOSIS — M5136 Other intervertebral disc degeneration, lumbar region: Secondary | ICD-10-CM

## 2013-09-14 ENCOUNTER — Other Ambulatory Visit (INDEPENDENT_AMBULATORY_CARE_PROVIDER_SITE_OTHER): Payer: No Typology Code available for payment source

## 2013-09-14 ENCOUNTER — Ambulatory Visit (INDEPENDENT_AMBULATORY_CARE_PROVIDER_SITE_OTHER): Payer: No Typology Code available for payment source | Admitting: Family Medicine

## 2013-09-14 VITALS — BP 120/72 | HR 74

## 2013-09-14 DIAGNOSIS — M65311 Trigger thumb, right thumb: Secondary | ICD-10-CM | POA: Insufficient documentation

## 2013-09-14 DIAGNOSIS — M79609 Pain in unspecified limb: Secondary | ICD-10-CM

## 2013-09-14 DIAGNOSIS — M6530 Trigger finger, unspecified finger: Secondary | ICD-10-CM

## 2013-09-14 DIAGNOSIS — M79644 Pain in right finger(s): Secondary | ICD-10-CM

## 2013-09-14 NOTE — Assessment & Plan Note (Signed)
Patient was given an injection today. Patient was put in a splint to wear for the next 72 hours and nightly for 2 weeks. We discussed icing protocol as well as massage. Patient was given a topical anti-inflammatory to try as well for any breakthrough pain. Patient will follow up in 2 weeks for further evaluation. Patient may need a repeat injection would like to wait 6 weeks in between injections.

## 2013-09-14 NOTE — Patient Instructions (Signed)
Very nice to meet you Injection today to help Wear brace day and night for next 3 days then nightly for next 2 weeks Ice 20 minutes 1-2 times a day can help.  Message it during the day.  If pain put topical on 2 times daily Come back in 2 weeks.

## 2013-09-16 ENCOUNTER — Encounter: Payer: Self-pay | Admitting: Family Medicine

## 2013-09-16 NOTE — Progress Notes (Signed)
  Tawana ScaleZach Suraya Vidrine D.O. [facility] [address] [place], [place] [address] Phone: [phone] Subjective:    I'm seeing this patient by the request  of:  Sanda Lingerhomas Jones, MD   CC: right thumb pain  BJY:NWGNFAOZHYHPI:Subjective Michael Ashley is a 63 y.o. male coming in with complaint of right thumb pain. Patient states he has had this for multiple months and seems to be getting worse. Patient states that it seems to get stuck in certain positions and does cause a lot of pain to straighten out. Patient sometimes has to use his other hand to straighten him out. Patient does not do any repetitive movement but does work on a mouse for long extended period of time. Patient denies any numbness or any weakness. Denies any injury to the area. Patient describes it as more of a sharp pain. Denies any discoloration of the skin. Denies any nighttime awakening the severity of 7/10.    Past medical history, social, surgical and family history all reviewed in electronic medical record.   Review of Systems: No headache, visual changes, nausea, vomiting, diarrhea, constipation, dizziness, abdominal pain, skin rash, fevers, chills, night sweats, weight loss, swollen lymph nodes, body aches, joint swelling, muscle aches, chest pain, shortness of breath, mood changes.   Objective Blood pressure 120/72, pulse 74, SpO2 96.00%.  General: No apparent distress alert and oriented x3 mood and affect normal, dressed appropriately.  HEENT: Pupils equal, extraocular movements intact  Respiratory: Patient's speak in full sentences and does not appear short of breath  Cardiovascular: No lower extremity edema, non tender, no erythema  Skin: Warm dry intact with no signs of infection or rash on extremities or on axial skeleton.  Abdomen: Soft nontender  Neuro: Cranial nerves II through XII are intact, neurovascularly intact in all extremities with 2+ DTRs and 2+ pulses.  Lymph: No lymphadenopathy of posterior or anterior cervical chain  or axillae bilaterally.  Gait normal with good balance and coordination.  MSK:  Non tender with full range of motion and good stability and symmetric strength and tone of shoulders, elbows, wrist, hip, knee and ankles bilaterally.  Hand exam shows the patient does have a nodule within the tendon sheath of the abductor pollicis longus tendon going over the A1 pulley. This is moderately tender to palpation. Neurovascularly intact distally with good capillary refill.   Limited musculoskeletal ultrasound was performed and interpreted by Judi SaaZachary M Orvis Stann Limited ultrasound shows the patient does have a nodule within the tendon sheath corresponds well with trigger finger. Impression: Trigger finger of the A1 pulley on the abductor pollicis longus tendon.  Procedure note After verbal consent patient was prepped with alcohol swabs and with a 25-gauge 5 inch needle under ultrasound guidance was injected with her 0.5 cc of 0.5% Marcaine and 0.5 cc of Kenalog 40 mg/dL. Patient tolerated the procedure well without any significant difficulty. Patient still had triggering the pain had resolved. Patient was splinted and post injection instructions were given.   Impression and Recommendations:     This case required medical decision making of moderate complexity.

## 2013-09-28 ENCOUNTER — Ambulatory Visit: Payer: No Typology Code available for payment source | Admitting: Family Medicine

## 2013-10-03 ENCOUNTER — Ambulatory Visit (INDEPENDENT_AMBULATORY_CARE_PROVIDER_SITE_OTHER): Payer: No Typology Code available for payment source | Admitting: Family Medicine

## 2013-10-03 ENCOUNTER — Encounter: Payer: Self-pay | Admitting: Family Medicine

## 2013-10-03 VITALS — BP 108/64 | HR 68 | Ht 69.5 in | Wt 242.0 lb

## 2013-10-03 DIAGNOSIS — M65311 Trigger thumb, right thumb: Secondary | ICD-10-CM

## 2013-10-03 DIAGNOSIS — M6530 Trigger finger, unspecified finger: Secondary | ICD-10-CM

## 2013-10-03 NOTE — Patient Instructions (Signed)
Use brace when you need it Message during the day can help See me when you need me.

## 2013-10-03 NOTE — Assessment & Plan Note (Signed)
Patient is doing extremely better after injection could in 3 weeks ago. Patient will do manual massage we discussed bracing as needed as well as icing. Patient has long is doing well can followup on an as-needed basis.

## 2013-10-03 NOTE — Progress Notes (Signed)
  [name] [address] [place], [place] [address] Phone: [phone] Subjective:     CC: right thumb pain follow up.   HYQ:MVHQIONGEX Michael Ashley is a 63 y.o. male coming in with complaint of right thumb pain. Patient was seen previously and did have a trigger thumb. Patient does have injection within the A1 pulley. Patient states since that time he has not had any pain. Patient states he still triggers somewhat but he is able to move it on its own. States that everyday it seems to be getting better. Does not affect any of his daily activities. He is resting comfortably. Wears the brace when he can.    Past medical history, social, surgical and family history all reviewed in electronic medical record.   Review of Systems: No headache, visual changes, nausea, vomiting, diarrhea, constipation, dizziness, abdominal pain, skin rash, fevers, chills, night sweats, weight loss, swollen lymph nodes, body aches, joint swelling, muscle aches, chest pain, shortness of breath, mood changes.   Objective Blood pressure 108/64, pulse 68, height 5' 9.5" (1.765 m), weight 242 lb (109.77 kg), SpO2 98.00%.  General: No apparent distress alert and oriented x3 mood and affect normal, dressed appropriately.  HEENT: Pupils equal, extraocular movements intact  Respiratory: Patient's speak in full sentences and does not appear short of breath  Cardiovascular: No lower extremity edema, non tender, no erythema  Skin: Warm dry intact with no signs of infection or rash on extremities or on axial skeleton.  Abdomen: Soft nontender  Neuro: Cranial nerves II through XII are intact, neurovascularly intact in all extremities with 2+ DTRs and 2+ pulses.  Lymph: No lymphadenopathy of posterior or anterior cervical chain or axillae bilaterally.  Gait normal with good balance and coordination.  MSK:  Non tender with full range of motion and good stability and symmetric strength and tone of  shoulders, elbows, wrist, hip, knee and ankles bilaterally.  Hand exam shows the patient does have a nodule within the tendon sheath of the abductor pollicis longus tendon significantly smaller than previous exam and nontender on exam.     Impression and Recommendations:     This case required medical decision making of moderate complexity.

## 2013-10-05 ENCOUNTER — Other Ambulatory Visit: Payer: Self-pay

## 2013-10-05 DIAGNOSIS — G4700 Insomnia, unspecified: Secondary | ICD-10-CM

## 2013-10-05 MED ORDER — LISINOPRIL 10 MG PO TABS: 10.0000 mg | ORAL_TABLET | Freq: Every day | ORAL | Status: DC

## 2013-10-05 MED ORDER — METFORMIN HCL 1000 MG PO TABS: 1000.0000 mg | ORAL_TABLET | Freq: Two times a day (BID) | ORAL | Status: DC

## 2013-10-05 MED ORDER — DOXEPIN HCL 10 MG PO CAPS: 10.0000 mg | ORAL_CAPSULE | Freq: Every day | ORAL | Status: DC

## 2013-10-05 MED ORDER — GLIPIZIDE ER 10 MG PO TB24: 10.0000 mg | ORAL_TABLET | Freq: Two times a day (BID) | ORAL | Status: DC

## 2013-10-05 NOTE — Telephone Encounter (Signed)
Corrected rx.

## 2013-11-13 ENCOUNTER — Other Ambulatory Visit: Payer: No Typology Code available for payment source

## 2013-11-14 ENCOUNTER — Ambulatory Visit
Admission: RE | Admit: 2013-11-14 | Discharge: 2013-11-14 | Disposition: A | Discharge status: Home / Self Care | Payer: No Typology Code available for payment source | Source: Ambulatory Visit | Attending: Neurosurgery | Admitting: Neurosurgery

## 2013-11-14 VITALS — BP 105/67 | HR 64

## 2013-11-14 DIAGNOSIS — M48061 Spinal stenosis, lumbar region without neurogenic claudication: Secondary | ICD-10-CM

## 2013-11-14 DIAGNOSIS — M5136 Other intervertebral disc degeneration, lumbar region: Secondary | ICD-10-CM

## 2013-11-14 MED ORDER — DIAZEPAM 5 MG PO TABS
10.0000 mg | ORAL_TABLET | Freq: Once | ORAL | Status: AC
  Administered 2013-11-14: 10 mg via ORAL

## 2013-11-14 MED ORDER — IOHEXOL 180 MG/ML  SOLN
15.0000 mL | Freq: Once | INTRAMUSCULAR | Status: AC | PRN
  Administered 2013-11-14: 15 mL via INTRATHECAL

## 2013-11-14 NOTE — Discharge Instructions (Signed)

## 2014-04-13 ENCOUNTER — Telehealth: Payer: Self-pay | Admitting: Internal Medicine

## 2014-04-13 MED ORDER — METFORMIN HCL 1000 MG PO TABS: 1000.0000 mg | ORAL_TABLET | Freq: Two times a day (BID) | ORAL | Status: DC

## 2014-04-13 NOTE — Telephone Encounter (Signed)
Pt called needs medication sent to  [facility], Metformin. [id number] (pt gave this #, not sure if needed).

## 2014-07-12 ENCOUNTER — Ambulatory Visit (INDEPENDENT_AMBULATORY_CARE_PROVIDER_SITE_OTHER): Payer: No Typology Code available for payment source | Admitting: Internal Medicine

## 2014-07-12 ENCOUNTER — Encounter: Payer: Self-pay | Admitting: Internal Medicine

## 2014-07-12 ENCOUNTER — Other Ambulatory Visit (INDEPENDENT_AMBULATORY_CARE_PROVIDER_SITE_OTHER): Payer: No Typology Code available for payment source

## 2014-07-12 VITALS — BP 120/78 | HR 70 | Temp 98.3°F | Resp 16 | Ht 69.5 in | Wt 239.0 lb

## 2014-07-12 DIAGNOSIS — Z0000 Encounter for general adult medical examination without abnormal findings: Secondary | ICD-10-CM

## 2014-07-12 DIAGNOSIS — N400 Enlarged prostate without lower urinary tract symptoms: Secondary | ICD-10-CM

## 2014-07-12 DIAGNOSIS — I10 Essential (primary) hypertension: Secondary | ICD-10-CM

## 2014-07-12 DIAGNOSIS — M48061 Spinal stenosis, lumbar region without neurogenic claudication: Secondary | ICD-10-CM

## 2014-07-12 DIAGNOSIS — E785 Hyperlipidemia, unspecified: Secondary | ICD-10-CM

## 2014-07-12 DIAGNOSIS — E118 Type 2 diabetes mellitus with unspecified complications: Secondary | ICD-10-CM

## 2014-07-12 DIAGNOSIS — Z1159 Encounter for screening for other viral diseases: Secondary | ICD-10-CM | POA: Insufficient documentation

## 2014-07-12 DIAGNOSIS — Z1211 Encounter for screening for malignant neoplasm of colon: Secondary | ICD-10-CM

## 2014-07-12 LAB — CBC WITH DIFFERENTIAL/PLATELET
Basophils Absolute: 0 10*3/uL (ref 0.0–0.1)
Basophils Relative: 0.4 % (ref 0.0–3.0)
Eosinophils Absolute: 0.4 10*3/uL (ref 0.0–0.7)
Eosinophils Relative: 5.4 % — ABNORMAL HIGH (ref 0.0–5.0)
HCT: 42.4 % (ref 39.0–52.0)
Hemoglobin: 14.6 g/dL (ref 13.0–17.0)
Lymphocytes Relative: 22.1 % (ref 12.0–46.0)
Lymphs Abs: 1.6 10*3/uL (ref 0.7–4.0)
MCHC: 34.4 g/dL (ref 30.0–36.0)
MCV: 88.4 fl (ref 78.0–100.0)
Monocytes Absolute: 0.5 10*3/uL (ref 0.1–1.0)
Monocytes Relative: 6.6 % (ref 3.0–12.0)
Neutro Abs: 4.7 10*3/uL (ref 1.4–7.7)
Neutrophils Relative %: 65.5 % (ref 43.0–77.0)
Platelets: 259 10*3/uL (ref 150.0–400.0)
RBC: 4.79 Mil/uL (ref 4.22–5.81)
RDW: 13.9 % (ref 11.5–15.5)
WBC: 7.1 10*3/uL (ref 4.0–10.5)

## 2014-07-12 LAB — COMPREHENSIVE METABOLIC PANEL
ALT: 22 U/L (ref 0–53)
AST: 21 U/L (ref 0–37)
Albumin: 4.4 g/dL (ref 3.5–5.2)
Alkaline Phosphatase: 75 U/L (ref 39–117)
BUN: 18 mg/dL (ref 6–23)
CO2: 27 mEq/L (ref 19–32)
Calcium: 9.6 mg/dL (ref 8.4–10.5)
Chloride: 103 mEq/L (ref 96–112)
Creatinine, Ser: 1.04 mg/dL (ref 0.40–1.50)
GFR: 76.03 mL/min (ref >60.00)
Glucose, Bld: 124 mg/dL — ABNORMAL HIGH (ref 70–99)
Potassium: 4.1 mEq/L (ref 3.5–5.1)
Sodium: 136 mEq/L (ref 135–145)
Total Bilirubin: 0.6 mg/dL (ref 0.2–1.2)
Total Protein: 7.8 g/dL (ref 6.0–8.3)

## 2014-07-12 LAB — LIPID PANEL
Cholesterol: 145 mg/dL (ref 0–200)
HDL: 39.1 mg/dL (ref >39.00)
LDL Cholesterol: 83 mg/dL (ref 0–99)
NonHDL: 105.9
Total CHOL/HDL Ratio: 4
Triglycerides: 116 mg/dL (ref 0.0–149.0)
VLDL: 23.2 mg/dL (ref 0.0–40.0)

## 2014-07-12 LAB — MICROALBUMIN / CREATININE URINE RATIO
Creatinine,U: 78.7 mg/dL
Microalb Creat Ratio: 1.1 mg/g (ref 0.0–30.0)
Microalb, Ur: 0.9 mg/dL (ref 0.0–1.9)

## 2014-07-12 LAB — URINALYSIS, ROUTINE W REFLEX MICROSCOPIC
Bilirubin Urine: NEGATIVE
Hgb urine dipstick: NEGATIVE
Ketones, ur: NEGATIVE
Leukocytes, UA: NEGATIVE
Nitrite: NEGATIVE
RBC / HPF: NONE SEEN (ref 0–?)
Specific Gravity, Urine: 1.025 (ref 1.000–1.030)
Total Protein, Urine: NEGATIVE
Urine Glucose: NEGATIVE
Urobilinogen, UA: 0.2 (ref 0.0–1.0)
WBC, UA: NONE SEEN (ref 0–?)
pH: 5.5 (ref 5.0–8.0)

## 2014-07-12 LAB — HEMOGLOBIN A1C: Hgb A1c MFr Bld: 7.4 % — ABNORMAL HIGH (ref 4.6–6.5)

## 2014-07-12 LAB — PSA: PSA: 1.21 ng/mL (ref 0.10–4.00)

## 2014-07-12 LAB — TSH: TSH: 3.03 u[IU]/mL (ref 0.35–4.50)

## 2014-07-12 MED ORDER — GLIPIZIDE ER 10 MG PO TB24: 10.0000 mg | ORAL_TABLET | Freq: Two times a day (BID) | ORAL | Status: DC

## 2014-07-12 MED ORDER — LISINOPRIL 10 MG PO TABS: 10.0000 mg | ORAL_TABLET | Freq: Every day | ORAL | Status: DC

## 2014-07-12 MED ORDER — METFORMIN HCL 1000 MG PO TABS: 1000.0000 mg | ORAL_TABLET | Freq: Two times a day (BID) | ORAL | Status: DC

## 2014-07-12 NOTE — Assessment & Plan Note (Signed)
He has no s.s that need to be treated Will check a PSA to screen for cancer

## 2014-07-12 NOTE — Progress Notes (Signed)
Pre visit review using our clinic review tool, if applicable. No additional management support is needed unless otherwise documented below in the visit note. 

## 2014-07-12 NOTE — Assessment & Plan Note (Signed)
I have asked him to f/up with Dr. Jeral FruitBotero about this

## 2014-07-12 NOTE — Assessment & Plan Note (Signed)
His BP is well controlled Will monitor his lytes and renal function 

## 2014-07-12 NOTE — Patient Instructions (Signed)

## 2014-07-12 NOTE — Assessment & Plan Note (Signed)
He was referred for an eye exam Will recheck his A1C and will monitor his renal function

## 2014-07-12 NOTE — Assessment & Plan Note (Signed)

## 2014-07-12 NOTE — Progress Notes (Signed)
Subjective:    Patient ID: Michael Ashley, male    DOB: 06/08/1950, 64 y.o.   MRN: [id number]  Diabetes He presents for his follow-up diabetic visit. He has type 2 diabetes mellitus. His disease course has been stable. There are no hypoglycemic associated symptoms. Pertinent negatives for hypoglycemia include no dizziness. There are no diabetic associated symptoms. Pertinent negatives for diabetes include no blurred vision, no chest pain, no fatigue, no foot paresthesias, no foot ulcerations, no polydipsia, no polyphagia, no polyuria, no visual change and no weight loss. There are no hypoglycemic complications. Symptoms are stable. There are no diabetic complications. Current diabetic treatment includes oral agent (dual therapy). His weight is stable. He is following a generally unhealthy diet. When asked about meal planning, he reported none. He has not had a previous visit with a dietitian. He participates in exercise intermittently. There is no change in his home blood glucose trend. An ACE inhibitor/angiotensin II receptor blocker is being taken. He does not see a podiatrist.Eye exam is not current.      Review of Systems  Constitutional: Negative.  Negative for fever, weight loss, diaphoresis, appetite change and fatigue.  HENT: Negative.   Eyes: Negative.  Negative for blurred vision.  Respiratory: Negative.  Negative for cough, choking, chest tightness, shortness of breath and stridor.   Cardiovascular: Negative.  Negative for chest pain, palpitations and leg swelling.  Gastrointestinal: Negative.  Negative for nausea, vomiting, abdominal pain, diarrhea, constipation and blood in stool.  Endocrine: Negative.  Negative for polydipsia, polyphagia and polyuria.  Genitourinary: Negative.   Musculoskeletal: Positive for back pain. Negative for myalgias, joint swelling, arthralgias, gait problem, neck pain and neck stiffness.  Skin: Negative.  Negative for rash.  Allergic/Immunologic: Negative.     Neurological: Negative.  Negative for dizziness.  Hematological: Negative.  Negative for adenopathy. Does not bruise/bleed easily.  Psychiatric/Behavioral: Negative.        Objective:   Physical Exam  Constitutional: He is oriented to person, place, and time. He appears well-developed and well-nourished. No distress.  HENT:  Head: Normocephalic and atraumatic.  Mouth/Throat: Oropharynx is clear and moist. No oropharyngeal exudate.  Eyes: Conjunctivae are normal. Right eye exhibits no discharge. Left eye exhibits no discharge. No scleral icterus.  Neck: Normal range of motion. Neck supple. No JVD present. No tracheal deviation present. No thyromegaly present.  Cardiovascular: Normal rate, regular rhythm, normal heart sounds and intact distal pulses.  Exam reveals no gallop and no friction rub.   No murmur heard. Pulmonary/Chest: Effort normal and breath sounds normal. No stridor. No respiratory distress. He has no wheezes. He has no rales. He exhibits no tenderness.  Abdominal: Soft. Bowel sounds are normal. He exhibits no distension and no mass. There is no tenderness. There is no rebound and no guarding. Hernia confirmed negative in the right inguinal area and confirmed negative in the left inguinal area.  Genitourinary: Rectum normal, testes normal and penis normal. Rectal exam shows no external hemorrhoid, no internal hemorrhoid, no fissure, no mass, no tenderness and anal tone normal. Guaiac negative stool. Prostate is enlarged (1+ smooth symm BPH). Prostate is not tender. Right testis shows no mass, no swelling and no tenderness. Right testis is descended. Left testis shows no mass, no swelling and no tenderness. Left testis is descended. Circumcised. No penile erythema or penile tenderness. No discharge found.  Musculoskeletal: Normal range of motion. He exhibits no edema or tenderness.  Lymphadenopathy:    He has no cervical adenopathy.  Right: No inguinal adenopathy present.        Left: No inguinal adenopathy present.  Neurological: He is oriented to person, place, and time.  Skin: Skin is warm and dry. No rash noted. He is not diaphoretic. No erythema. No pallor.  Psychiatric: He has a normal mood and affect. His behavior is normal. Judgment and thought content normal.  Vitals reviewed.    Lab Results  Component Value Date   WBC 6.2 12/06/2012   HGB 13.5 12/06/2012   HCT 39.6 12/06/2012   PLT 243 12/06/2012   GLUCOSE 96 08/22/2013   CHOL 128 12/06/2012   TRIG 115 12/06/2012   HDL 37* 12/06/2012   LDLCALC 68 12/06/2012   ALT 36 12/06/2012   AST 38 12/06/2012   NA 139 08/22/2013   K 4.2 08/22/2013   CL 102 08/22/2013   CREATININE 1.1 08/22/2013   BUN 17 08/22/2013   CO2 28 08/22/2013   TSH 2.25 08/22/2013   HGBA1C 7.1* 08/22/2013       Assessment & Plan:

## 2014-08-14 ENCOUNTER — Encounter: Payer: Self-pay | Admitting: Internal Medicine

## 2014-08-16 ENCOUNTER — Encounter: Payer: Self-pay | Admitting: Internal Medicine

## 2014-09-08 IMAGING — CT CT L SPINE W/ CM
4 of 10 series · 13 of 33 positions shown, 14 images · non-contrast
Comparison: MRI lumbar spine 10/18/2012.

CLINICAL DATA: Lower extremity weakness. Difficulty and extending
at the ankle joints.
TECHNIQUE: Contiguous axial images were obtained through the Lumbar spine after
the intrathecal infusion of infusion. Coronal and sagittal
reconstructions were obtained of the axial image sets.

[Series 2: l spine bone · axial · 0.27mm/px · z∈[-274,-186]mm · 2 of 105 slices shown, 3 images]
[im 35/105  soft-tissue]
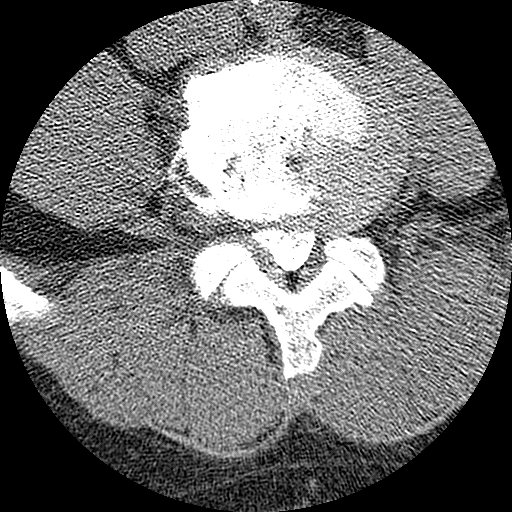
[im 35/105  bone]
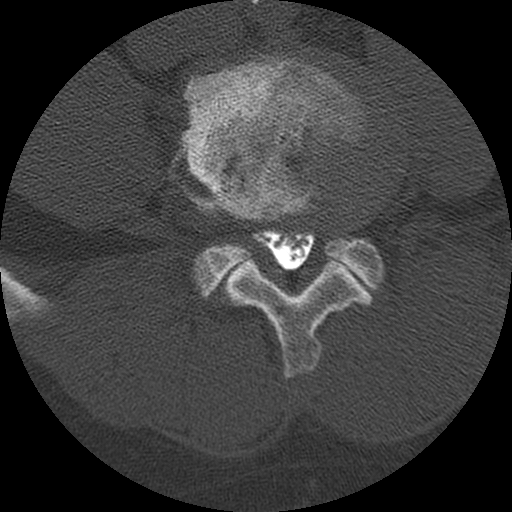
[im 70/105  bone]
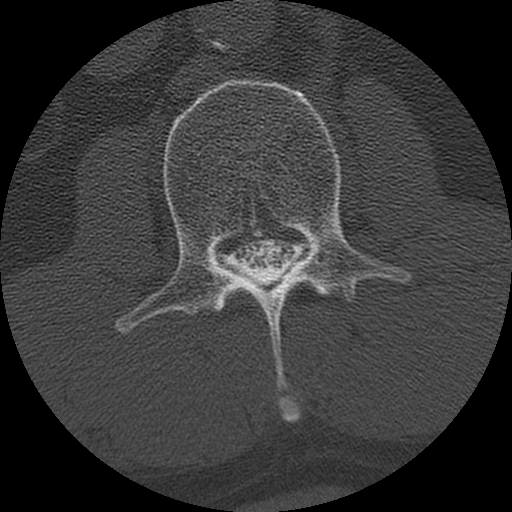

[Series 3: l spine soft · axial · 0.27mm/px · z∈[-294,-164]mm · 3 of 105 slices shown]
[im 27/105  soft-tissue]
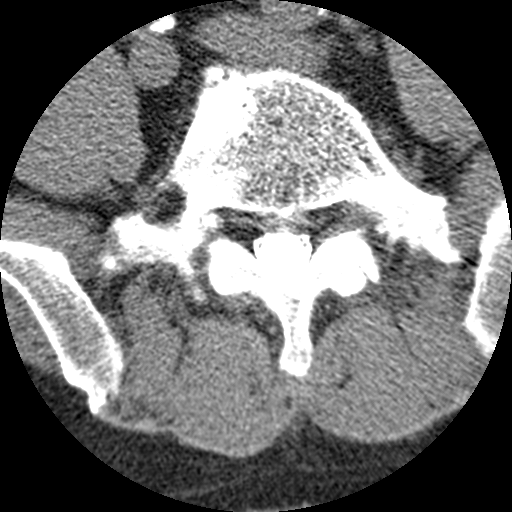
[im 53/105  soft-tissue]
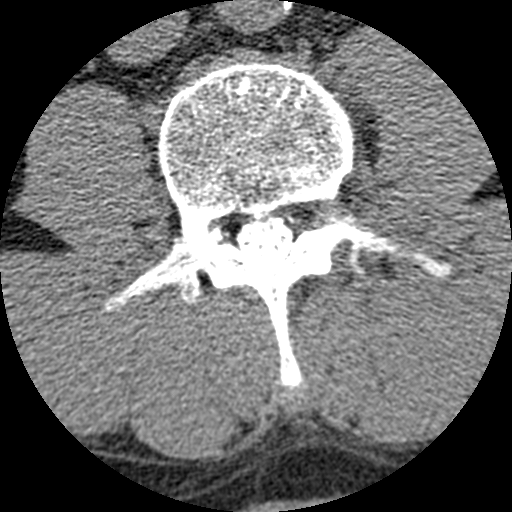
[im 79/105  soft-tissue]
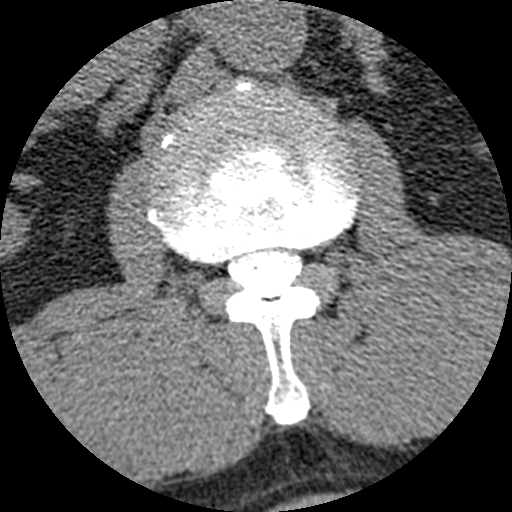

[Series 104: cor/lower · coronal · 0.31mm/px · 3 of 50 slices shown]
[im 10/50  bone]
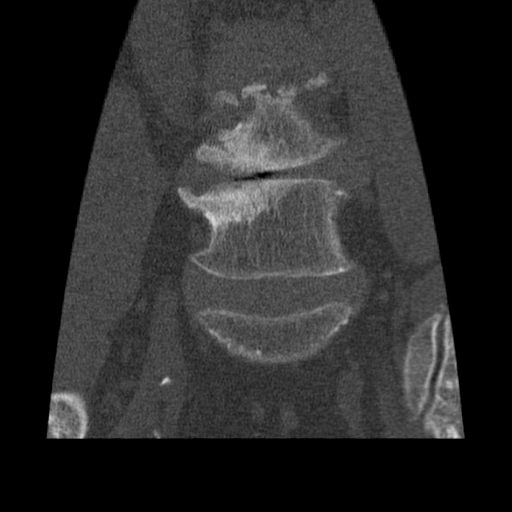
[im 20/50  bone]
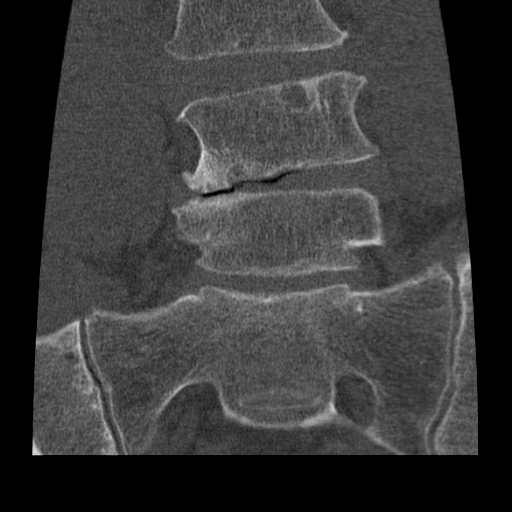
[im 30/50  bone]
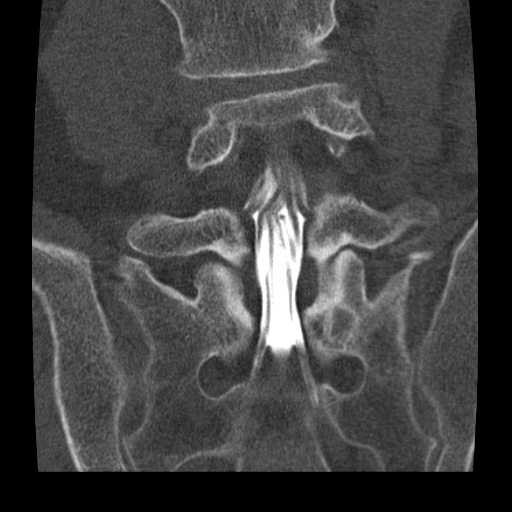

[Series 105: sag · sagittal · 0.52mm/px · 5 of 57 slices shown]
[im 10/57  bone]
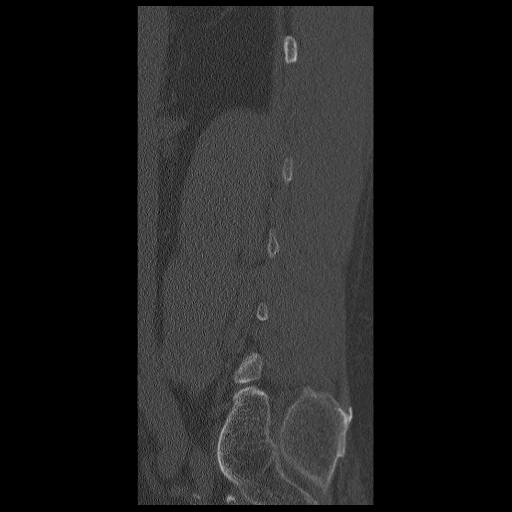
[im 19/57  bone]
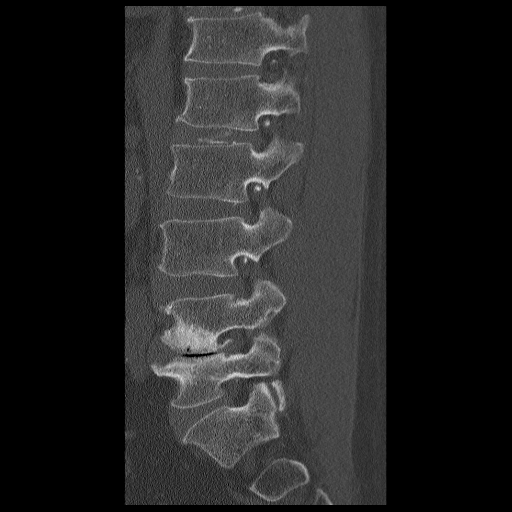
[im 29/57  bone]
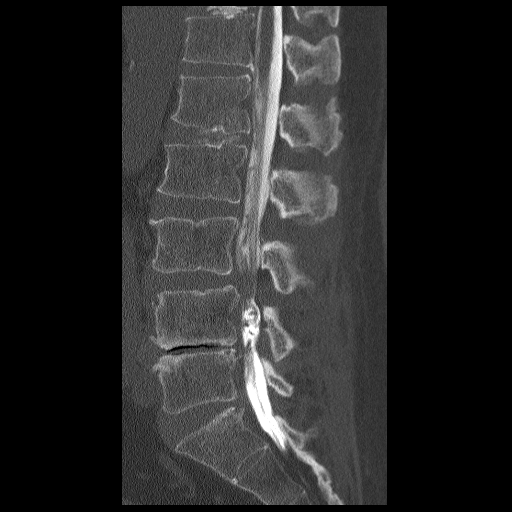
[im 38/57  bone]
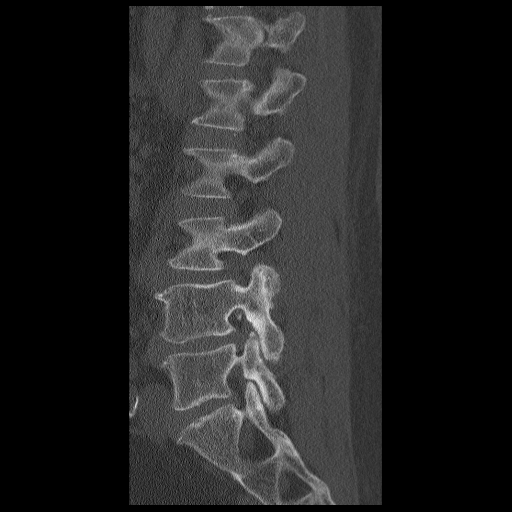
[im 47/57  bone]
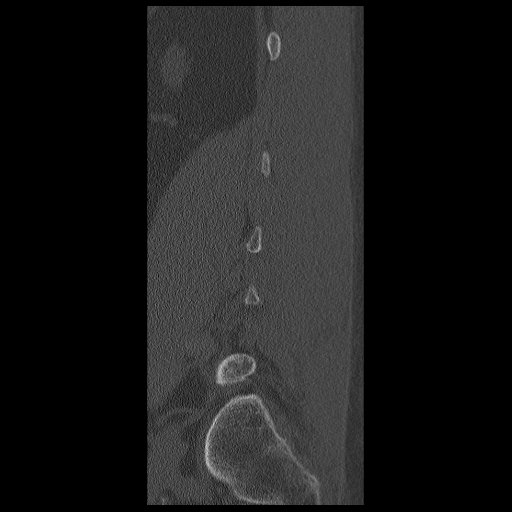

[13 of 33 positions shown; findings below may reference images not displayed]

EXAM:
LUMBAR MYELOGRAM

FLUOROSCOPY TIME:  1 min 45 seconds

PROCEDURE:
After thorough discussion of risks and benefits of the procedure
including bleeding, infection, injury to nerves, blood vessels,
adjacent structures as well as headache and CSF leak, written and
oral informed consent was obtained. Consent was obtained by Dr.
Heynner Sando. Time out form was completed.

Patient was positioned prone on the fluoroscopy table. Local
anesthesia was provided with 1% lidocaine without epinephrine after
prepped and draped in the usual sterile fashion. Puncture was
performed at L2-3 using a 3 1/2 inch 22-gauge spinal needle via
right paramedian approach. Using a single pass through the dura, the
needle was placed within the thecal sac, with return of clear CSF.
15 mL of Kmnipaque-[id number] was injected into the thecal sac, with normal
opacification of the nerve roots and cauda equina consistent with
free flow within the subarachnoid space.

I personally performed the lumbar puncture and administered the
intrathecal contrast. I also personally supervised acquisition of
the myelogram images.
FINDINGS: LUMBAR MYELOGRAM FINDINGS:

Five non rib-bearing lumbar type vertebral bodies are present.
Moderate central canal stenosis is present at L3-4 with lateral
recess narrowing bilaterally, right greater than left. There is mild
leftward curvature of the lumbar spine at L4-5. Mild lateral recess
narrowing is present at L4-5 is well with a broad-based disc
protrusion. Mild disc bulging is present at L5-S1 without
significant stenosis.

Mild disc bulging is present at L2-3 with minimal left lateral
recess narrowing.

Endplate marrow changes are present on the right at L4-5.

The flexion images demonstrate exacerbation of slight
anterolisthesis at L3-4. This is reduced in extension.

CT LUMBAR MYELOGRAM FINDINGS:

The lumbar spine is imaged from T11-12 through S3. The conus
medullaris terminates at L1, within normal limits. There is partial
calcification of the L1-2 disc. A vacuum disc is present at L4-5
with chronic endplate sclerotic changes on the right. Focal leftward
curvature is present in the lumbar spine at L4-5.

Limited imaging of the abdomen demonstrates atherosclerotic
calcifications in the aorta without aneurysm.

L1-2: A mild broad-based disc bulge is present. There is no
significant focal protrusion or stenosis.

L2-3: A broad-based disc bulge is present. Asymmetric left-sided
facet hypertrophy and ligamentum flavum thickening results in
minimal left lateral recess narrowing. The foramina are patent.

L3-4: Moderate facet degenerative changes are present. Ligamentum
flavum thickening is noted. There is slight anterolisthesis with
uncovering of a broad-based disc protrusion. This results in
moderate lateral recess narrowing bilaterally. Moderate left and
mild right foraminal stenosis is present.

L4-5: A leftward disc protrusion is associated with the vocal
scoliosis. Facet hypertrophy and ligamentum flavum thickening is
present. Mild to moderate left lateral recess and bilateral
foraminal stenosis is present.

L5-S1: A shallow central disc protrusion is present without
significant stenosis.
IMPRESSION: LUMBAR MYELOGRAM IMPRESSION:

1. Moderate central canal stenosis at L3-4 with slight
anterolisthesis exaggerated by flexion.
2. Mild lateral recess narrowing at L4-5 secondary to a broad-based
disc protrusion
3. Mild left lateral recess narrowing at L2-3.

CT LUMBAR MYELOGRAM IMPRESSION:

1. Minimal left lateral recess narrowing at L2-3 secondary to a
broad-based disc bulge and left-sided and ligamentum flavum
thickening.
2. Moderate lateral recess narrowing bilaterally with moderate left
and mild right foraminal stenosis at L3-4.
3. Mild to moderate left lateral recess and bilateral foraminal
stenosis at L4-5.
4. Shallow central disc protrusion without significant stenosis at
L5-S1.

## 2014-11-15 ENCOUNTER — Telehealth: Payer: Self-pay | Admitting: Internal Medicine

## 2014-11-15 DIAGNOSIS — I10 Essential (primary) hypertension: Secondary | ICD-10-CM

## 2014-11-15 DIAGNOSIS — E118 Type 2 diabetes mellitus with unspecified complications: Secondary | ICD-10-CM

## 2014-11-15 MED ORDER — METFORMIN HCL 1000 MG PO TABS: 1000.0000 mg | ORAL_TABLET | Freq: Two times a day (BID) | ORAL | Status: DC

## 2014-11-15 MED ORDER — LISINOPRIL 10 MG PO TABS: 10.0000 mg | ORAL_TABLET | Freq: Every day | ORAL | Status: DC

## 2014-11-15 MED ORDER — GLIPIZIDE ER 10 MG PO TB24
10.0000 mg | ORAL_TABLET | Freq: Two times a day (BID) | ORAL | Status: DC
Start: 2014-11-15 — End: 2015-10-10

## 2014-11-15 NOTE — Telephone Encounter (Signed)
Patient is requesting refill on lisinopril, glipizide and metformin to be sent to [facility].

## 2015-05-06 MED FILL — ?LISINOPRIL 10 MG TABLET: 10 | 30 days supply | Qty: 30 | Fill #4

## 2015-06-06 MED FILL — metFORMIN HCL 1000 MG TABS: 1000 | 30 days supply | Qty: 60 | Fill #3

## 2015-06-10 MED FILL — ?LISINOPRIL 10 MG TABLET: 10 | 30 days supply | Qty: 30 | Fill #5

## 2015-07-01 MED FILL — ?LISINOPRIL 10 MG TABLET: 10 | 30 days supply | Qty: 30 | Fill #6

## 2015-07-01 MED FILL — ?METFORMIN HCL 1,000 MG TAB: 1000 | 30 days supply | Qty: 60 | Fill #4

## 2015-07-18 ENCOUNTER — Other Ambulatory Visit: Payer: Self-pay | Admitting: *Deleted

## 2015-07-18 DIAGNOSIS — E118 Type 2 diabetes mellitus with unspecified complications: Secondary | ICD-10-CM

## 2015-07-18 MED ORDER — METFORMIN HCL 1000 MG PO TABS: 1000.0000 mg | ORAL_TABLET | Freq: Two times a day (BID) | ORAL | Status: DC

## 2015-07-18 NOTE — Telephone Encounter (Signed)
Left msg on triage requesting refill on his metformin. Pt is due for yearly CPX sent 30 day until appt....Raechel Chute/lmb

## 2015-07-30 ENCOUNTER — Other Ambulatory Visit: Payer: Self-pay | Admitting: Internal Medicine

## 2015-07-30 MED FILL — glipiZIDE ER 10 MG TB24: 10 | 30 days supply | Qty: 60 | Fill #3

## 2015-07-30 MED FILL — LISINOPRIL 10 MG TABLET: 10 | 30 days supply | Qty: 30 | Fill #7

## 2015-07-31 MED FILL — metFORMIN HCL 1000 MG TABS: 1000 | 30 days supply | Qty: 60 | Fill #0

## 2015-10-09 MED FILL — LISINOPRIL 10 MG TABLET: 10 | 30 days supply | Qty: 30 | Fill #8

## 2015-10-10 ENCOUNTER — Ambulatory Visit (INDEPENDENT_AMBULATORY_CARE_PROVIDER_SITE_OTHER): Payer: BLUE CROSS/BLUE SHIELD | Admitting: Internal Medicine

## 2015-10-10 ENCOUNTER — Encounter: Payer: Self-pay | Admitting: Internal Medicine

## 2015-10-10 ENCOUNTER — Other Ambulatory Visit (INDEPENDENT_AMBULATORY_CARE_PROVIDER_SITE_OTHER): Payer: BLUE CROSS/BLUE SHIELD

## 2015-10-10 VITALS — BP 110/70 | HR 81 | Temp 97.8°F | Resp 16 | Ht 69.5 in | Wt 240.0 lb

## 2015-10-10 DIAGNOSIS — N400 Enlarged prostate without lower urinary tract symptoms: Secondary | ICD-10-CM

## 2015-10-10 DIAGNOSIS — Z0000 Encounter for general adult medical examination without abnormal findings: Secondary | ICD-10-CM | POA: Diagnosis not present

## 2015-10-10 DIAGNOSIS — E785 Hyperlipidemia, unspecified: Secondary | ICD-10-CM

## 2015-10-10 DIAGNOSIS — I10 Essential (primary) hypertension: Secondary | ICD-10-CM

## 2015-10-10 DIAGNOSIS — Z1159 Encounter for screening for other viral diseases: Secondary | ICD-10-CM

## 2015-10-10 DIAGNOSIS — M4806 Spinal stenosis, lumbar region: Secondary | ICD-10-CM

## 2015-10-10 DIAGNOSIS — E118 Type 2 diabetes mellitus with unspecified complications: Secondary | ICD-10-CM | POA: Diagnosis not present

## 2015-10-10 DIAGNOSIS — M48061 Spinal stenosis, lumbar region without neurogenic claudication: Secondary | ICD-10-CM

## 2015-10-10 LAB — COMPREHENSIVE METABOLIC PANEL
ALT: 27 U/L (ref 0–53)
AST: 26 U/L (ref 0–37)
Albumin: 4.1 g/dL (ref 3.5–5.2)
Alkaline Phosphatase: 73 U/L (ref 39–117)
BUN: 18 mg/dL (ref 6–23)
CO2: 27 mEq/L (ref 19–32)
Calcium: 9.2 mg/dL (ref 8.4–10.5)
Chloride: 104 mEq/L (ref 96–112)
Creatinine, Ser: 1.03 mg/dL (ref 0.40–1.50)
GFR: 76.59 mL/min (ref >60.00)
Glucose, Bld: 146 mg/dL — ABNORMAL HIGH (ref 70–99)
Potassium: 4.1 mEq/L (ref 3.5–5.1)
Sodium: 139 mEq/L (ref 135–145)
Total Bilirubin: 0.6 mg/dL (ref 0.2–1.2)
Total Protein: 7.3 g/dL (ref 6.0–8.3)

## 2015-10-10 LAB — LIPID PANEL
Cholesterol: 152 mg/dL (ref 0–200)
HDL: 34.7 mg/dL — ABNORMAL LOW (ref >39.00)
NonHDL: 116.98
Total CHOL/HDL Ratio: 4
Triglycerides: 207 mg/dL — ABNORMAL HIGH (ref 0.0–149.0)
VLDL: 41.4 mg/dL — ABNORMAL HIGH (ref 0.0–40.0)

## 2015-10-10 LAB — URINALYSIS, ROUTINE W REFLEX MICROSCOPIC
Bilirubin Urine: NEGATIVE
Ketones, ur: NEGATIVE
Leukocytes, UA: NEGATIVE
Nitrite: NEGATIVE
Specific Gravity, Urine: 1.025 (ref 1.000–1.030)
Total Protein, Urine: NEGATIVE
Urine Glucose: >=1000 — AB
Urobilinogen, UA: 0.2 (ref 0.0–1.0)
pH: 5.5 (ref 5.0–8.0)

## 2015-10-10 LAB — CBC WITH DIFFERENTIAL/PLATELET
Basophils Absolute: 0 10*3/uL (ref 0.0–0.1)
Basophils Relative: 0.4 % (ref 0.0–3.0)
Eosinophils Absolute: 0.3 10*3/uL (ref 0.0–0.7)
Eosinophils Relative: 4.2 % (ref 0.0–5.0)
HCT: 41.7 % (ref 39.0–52.0)
Hemoglobin: 14.1 g/dL (ref 13.0–17.0)
Lymphocytes Relative: 21.6 % (ref 12.0–46.0)
Lymphs Abs: 1.3 10*3/uL (ref 0.7–4.0)
MCHC: 33.7 g/dL (ref 30.0–36.0)
MCV: 89 fl (ref 78.0–100.0)
Monocytes Absolute: 0.5 10*3/uL (ref 0.1–1.0)
Monocytes Relative: 8.3 % (ref 3.0–12.0)
Neutro Abs: 4 10*3/uL (ref 1.4–7.7)
Neutrophils Relative %: 65.5 % (ref 43.0–77.0)
Platelets: 203 10*3/uL (ref 150.0–400.0)
RBC: 4.69 Mil/uL (ref 4.22–5.81)
RDW: 13.9 % (ref 11.5–15.5)
WBC: 6.1 10*3/uL (ref 4.0–10.5)

## 2015-10-10 LAB — LDL CHOLESTEROL, DIRECT: Direct LDL: 84 mg/dL

## 2015-10-10 LAB — PSA: PSA: 1.36 ng/mL (ref 0.10–4.00)

## 2015-10-10 LAB — TSH: TSH: 2.18 u[IU]/mL (ref 0.35–4.50)

## 2015-10-10 LAB — FECAL OCCULT BLOOD, GUAIAC: Fecal Occult Blood: NEGATIVE

## 2015-10-10 LAB — MICROALBUMIN / CREATININE URINE RATIO
Creatinine,U: 106.7 mg/dL
Microalb Creat Ratio: 0.7 mg/g (ref 0.0–30.0)
Microalb, Ur: <0.7 mg/dL (ref 0.0–1.9)

## 2015-10-10 LAB — HEMOGLOBIN A1C: Hgb A1c MFr Bld: 7.1 % — ABNORMAL HIGH (ref 4.6–6.5)

## 2015-10-10 MED ORDER — METFORMIN HCL 1000 MG PO TABS: 1000.0000 mg | ORAL_TABLET | Freq: Two times a day (BID) | ORAL | Status: DC

## 2015-10-10 MED ORDER — ASPIRIN EC 81 MG PO TBEC
81.0000 mg | DELAYED_RELEASE_TABLET | Freq: Every day | ORAL | Status: DC
Start: 2015-10-10 — End: 2020-12-18

## 2015-10-10 MED ORDER — LISINOPRIL 10 MG PO TABS: 10.0000 mg | ORAL_TABLET | Freq: Every day | ORAL | Status: DC

## 2015-10-10 MED ORDER — ATORVASTATIN CALCIUM 20 MG PO TABS: 20.0000 mg | ORAL_TABLET | Freq: Every day | ORAL | Status: DC

## 2015-10-10 MED ORDER — GLIPIZIDE ER 10 MG PO TB24: 10.0000 mg | ORAL_TABLET | Freq: Every day | ORAL | Status: DC

## 2015-10-10 MED FILL — ATORVASTATIN 20 MG TABLET: 20 | 30 days supply | Qty: 30 | Fill #0

## 2015-10-10 MED FILL — ?METFORMIN HCL 1,000 MG TAB: 1000 | 30 days supply | Qty: 60 | Fill #0

## 2015-10-10 NOTE — Patient Instructions (Signed)

## 2015-10-10 NOTE — Progress Notes (Signed)
Pre visit review using our clinic review tool, if applicable. No additional management support is needed unless otherwise documented below in the visit note. 

## 2015-10-10 NOTE — Progress Notes (Signed)
Subjective:  Patient ID: Michael Ashley, male    DOB: 08-13-1950  Age: 65 y.o. MRN: [id number]  CC: Annual Exam; Hyperlipidemia; and Diabetes   HPI [place] Koller presents for a CPX.   He needs a follow-up on diabetes, he thinks his blood sugars are well-controlled since he has had no symptoms such as polyuria/polydipsia/polyphagia. He tells me he has never had an eye examination but denies any recent visual disturbance.  He tells me his blood pressure is well-controlled on lisinopril and he denies cough, headache, blurred vision, chest pain, shortness of breath, edema, palpitations, dyspnea on exertion, or fatigue.   Marland Kitchen Past Medical History  Diagnosis Date  . Diabetes mellitus without complication (HCC)   . Hypertension    No past surgical history on file.  reports that he has been smoking.  He does not have any smokeless tobacco history on file. He reports that he does not drink alcohol or use illicit drugs. family history includes Kidney disease in his brother. No Known Allergies  Outpatient Prescriptions Prior to Visit  Medication Sig Dispense Refill  . glipiZIDE (GLUCOTROL XL) 10 MG 24 hr tablet Take 1 tablet (10 mg total) by mouth 2 (two) times daily. 180 tablet 1  . lisinopril (PRINIVIL,ZESTRIL) 10 MG tablet Take 1 tablet (10 mg total) by mouth daily. 90 tablet 3  . metFORMIN (GLUCOPHAGE) 1000 MG tablet Take 1 tablet (1,000 mg total) by mouth 2 (two) times daily with a meal. Overdue for yearly physical w/labs must see md for refills 60 tablet 0  . doxepin (SINEQUAN) 10 MG capsule Take 1 capsule (10 mg total) by mouth at bedtime. 90 capsule 3   No facility-administered medications prior to visit.    ROS Review of Systems  Constitutional: Negative.  Negative for fever, chills, diaphoresis, appetite change and fatigue.  HENT: Negative.  Negative for trouble swallowing.   Eyes: Negative.  Negative for visual disturbance.  Respiratory: Negative.  Negative for cough, choking, chest  tightness, shortness of breath and stridor.   Cardiovascular: Negative.  Negative for chest pain, palpitations and leg swelling.  Gastrointestinal: Negative.  Negative for nausea, vomiting, abdominal pain, diarrhea and constipation.  Endocrine: Negative.  Negative for polydipsia, polyphagia and polyuria.  Genitourinary: Negative.  Negative for dysuria, urgency, frequency, hematuria, flank pain, penile swelling, scrotal swelling, difficulty urinating and penile pain.  Musculoskeletal: Positive for back pain. Negative for myalgias and neck pain.       He complains of worsening low back pain over the last few years. He was previously seen by neurosurgery and was told that he would benefit from a surgical intervention for spinal stenosis. The pain sometimes radiates into his lower extremities and he states that he is having trouble with incoordination in his lower extremities.  Skin: Negative.  Negative for rash.  Allergic/Immunologic: Negative.   Neurological: Negative.  Negative for dizziness, tremors, weakness, light-headedness, numbness and headaches.  Hematological: Negative.  Negative for adenopathy. Does not bruise/bleed easily.  Psychiatric/Behavioral: Negative.     Objective:  BP 110/70 mmHg  Pulse 81  Temp(Src) 97.8 F (36.6 C) (Oral)  Resp 16  Ht 5' 9.5" (1.765 m)  Wt 240 lb (108.863 kg)  BMI 34.95 kg/m2  SpO2 96%  BP Readings from Last 3 Encounters:  10/10/15 110/70  07/12/14 120/78  11/14/13 105/67    Wt Readings from Last 3 Encounters:  10/10/15 240 lb (108.863 kg)  07/12/14 239 lb (108.41 kg)  10/03/13 242 lb (109.77 kg)  Physical Exam  Constitutional: He is oriented to person, place, and time. No distress.  HENT:  Head: Normocephalic and atraumatic.  Mouth/Throat: Oropharynx is clear and moist. No oropharyngeal exudate.  Eyes: Conjunctivae are normal. Right eye exhibits no discharge. Left eye exhibits no discharge. No scleral icterus.  Neck: Normal range of  motion. Neck supple. No JVD present. No tracheal deviation present. No thyromegaly present.  Cardiovascular: Normal rate, regular rhythm, normal heart sounds and intact distal pulses.  Exam reveals no gallop and no friction rub.   No murmur heard. EKG ---  Sinus  Rhythm  WITHIN NORMAL LIMITS  Pulmonary/Chest: Effort normal and breath sounds normal. No stridor. No respiratory distress. He has no wheezes. He has no rales. He exhibits no tenderness.  Abdominal: Soft. Bowel sounds are normal. He exhibits no distension and no mass. There is no tenderness. There is no rebound and no guarding. Hernia confirmed negative in the right inguinal area and confirmed negative in the left inguinal area.  Genitourinary: Rectum normal, testes normal and penis normal. Rectal exam shows no external hemorrhoid, no internal hemorrhoid, no fissure, no mass, no tenderness and anal tone normal. Guaiac negative stool. Prostate is enlarged (1+ smooth symm BPH). Prostate is not tender. Right testis shows no mass, no swelling and no tenderness. Right testis is descended. Left testis shows no mass, no swelling and no tenderness. Left testis is descended. Uncircumcised. No phimosis, paraphimosis, hypospadias, penile erythema or penile tenderness. No discharge found.  Musculoskeletal: Normal range of motion. He exhibits no edema or tenderness.  Lymphadenopathy:    He has no cervical adenopathy.       Right: No inguinal adenopathy present.       Left: No inguinal adenopathy present.  Neurological: He is oriented to person, place, and time.  Skin: Skin is warm and dry. No rash noted. He is not diaphoretic. No erythema. No pallor.  Psychiatric: He has a normal mood and affect. His behavior is normal. Judgment and thought content normal.  Vitals reviewed.   Lab Results  Component Value Date   WBC 6.1 10/10/2015   HGB 14.1 10/10/2015   HCT 41.7 10/10/2015   PLT 203.0 10/10/2015   GLUCOSE 146* 10/10/2015   CHOL 152 10/10/2015    TRIG 207.0* 10/10/2015   HDL 34.70* 10/10/2015   LDLDIRECT 84.0 10/10/2015   LDLCALC 83 07/12/2014   ALT 27 10/10/2015   AST 26 10/10/2015   NA 139 10/10/2015   K 4.1 10/10/2015   CL 104 10/10/2015   CREATININE 1.03 10/10/2015   BUN 18 10/10/2015   CO2 27 10/10/2015   TSH 2.18 10/10/2015   PSA 1.36 10/10/2015   HGBA1C 7.1* 10/10/2015   MICROALBUR <0.7 10/10/2015    Ct Lumbar Spine W Contrast  11/14/2013  CLINICAL DATA:  Lower extremity weakness. Difficulty and extending at the ankle joints. EXAM: LUMBAR MYELOGRAM FLUOROSCOPY TIME:  1 min 45 seconds PROCEDURE: After thorough discussion of risks and benefits of the procedure including bleeding, infection, injury to nerves, blood vessels, adjacent structures as well as headache and CSF leak, written and oral informed consent was obtained. Consent was obtained by Dr. Gennette Pac. Time out form was completed. Patient was positioned prone on the fluoroscopy table. Local anesthesia was provided with 1% lidocaine without epinephrine after prepped and draped in the usual sterile fashion. Puncture was performed at L2-3 using a 3 1/2 inch 22-gauge spinal needle via right paramedian approach. Using a single pass through the dura, the needle was  placed within the thecal sac, with return of clear CSF. 15 mL of Omnipaque-180 was injected into the thecal sac, with normal opacification of the nerve roots and cauda equina consistent with free flow within the subarachnoid space. I personally performed the lumbar puncture and administered the intrathecal contrast. I also personally supervised acquisition of the myelogram images. TECHNIQUE: Contiguous axial images were obtained through the Lumbar spine after the intrathecal infusion of infusion. Coronal and sagittal reconstructions were obtained of the axial image sets. COMPARISON:  MRI lumbar spine 10/18/2012. FINDINGS: LUMBAR MYELOGRAM FINDINGS: Five non rib-bearing lumbar type vertebral bodies are present.  Moderate central canal stenosis is present at L3-4 with lateral recess narrowing bilaterally, right greater than left. There is mild leftward curvature of the lumbar spine at L4-5. Mild lateral recess narrowing is present at L4-5 is well with a broad-based disc protrusion. Mild disc bulging is present at L5-S1 without significant stenosis. Mild disc bulging is present at L2-3 with minimal left lateral recess narrowing. Endplate marrow changes are present on the right at L4-5. The flexion images demonstrate exacerbation of slight anterolisthesis at L3-4. This is reduced in extension. CT LUMBAR MYELOGRAM FINDINGS: The lumbar spine is imaged from T11-12 through S3. The conus medullaris terminates at L1, within normal limits. There is partial calcification of the L1-2 disc. A vacuum disc is present at L4-5 with chronic endplate sclerotic changes on the right. Focal leftward curvature is present in the lumbar spine at L4-5. Limited imaging of the abdomen demonstrates atherosclerotic calcifications in the aorta without aneurysm. L1-2: A mild broad-based disc bulge is present. There is no significant focal protrusion or stenosis. L2-3: A broad-based disc bulge is present. Asymmetric left-sided facet hypertrophy and ligamentum flavum thickening results in minimal left lateral recess narrowing. The foramina are patent. L3-4: Moderate facet degenerative changes are present. Ligamentum flavum thickening is noted. There is slight anterolisthesis with uncovering of a broad-based disc protrusion. This results in moderate lateral recess narrowing bilaterally. Moderate left and mild right foraminal stenosis is present. L4-5: A leftward disc protrusion is associated with the vocal scoliosis. Facet hypertrophy and ligamentum flavum thickening is present. Mild to moderate left lateral recess and bilateral foraminal stenosis is present. L5-S1: A shallow central disc protrusion is present without significant stenosis. IMPRESSION: LUMBAR  MYELOGRAM IMPRESSION: 1. Moderate central canal stenosis at L3-4 with slight anterolisthesis exaggerated by flexion. 2. Mild lateral recess narrowing at L4-5 secondary to a broad-based disc protrusion 3. Mild left lateral recess narrowing at L2-3. CT LUMBAR MYELOGRAM IMPRESSION: 1. Minimal left lateral recess narrowing at L2-3 secondary to a broad-based disc bulge and left-sided and ligamentum flavum thickening. 2. Moderate lateral recess narrowing bilaterally with moderate left and mild right foraminal stenosis at L3-4. 3. Mild to moderate left lateral recess and bilateral foraminal stenosis at L4-5. 4. Shallow central disc protrusion without significant stenosis at L5-S1. Electronically Signed   By: Gennette Pachris  Mattern M.D.   On: 11/14/2013 14:11   Dg Myelography Lumbar Inj Lumbosacral  11/14/2013  CLINICAL DATA:  Lower extremity weakness. Difficulty and extending at the ankle joints. EXAM: LUMBAR MYELOGRAM FLUOROSCOPY TIME:  1 min 45 seconds PROCEDURE: After thorough discussion of risks and benefits of the procedure including bleeding, infection, injury to nerves, blood vessels, adjacent structures as well as headache and CSF leak, written and oral informed consent was obtained. Consent was obtained by Dr. Gennette Pachris Mattern. Time out form was completed. Patient was positioned prone on the fluoroscopy table. Local anesthesia was provided with 1% lidocaine  without epinephrine after prepped and draped in the usual sterile fashion. Puncture was performed at L2-3 using a 3 1/2 inch 22-gauge spinal needle via right paramedian approach. Using a single pass through the dura, the needle was placed within the thecal sac, with return of clear CSF. 15 mL of Omnipaque-180 was injected into the thecal sac, with normal opacification of the nerve roots and cauda equina consistent with free flow within the subarachnoid space. I personally performed the lumbar puncture and administered the intrathecal contrast. I also personally  supervised acquisition of the myelogram images. TECHNIQUE: Contiguous axial images were obtained through the Lumbar spine after the intrathecal infusion of infusion. Coronal and sagittal reconstructions were obtained of the axial image sets. COMPARISON:  MRI lumbar spine 10/18/2012. FINDINGS: LUMBAR MYELOGRAM FINDINGS: Five non rib-bearing lumbar type vertebral bodies are present. Moderate central canal stenosis is present at L3-4 with lateral recess narrowing bilaterally, right greater than left. There is mild leftward curvature of the lumbar spine at L4-5. Mild lateral recess narrowing is present at L4-5 is well with a broad-based disc protrusion. Mild disc bulging is present at L5-S1 without significant stenosis. Mild disc bulging is present at L2-3 with minimal left lateral recess narrowing. Endplate marrow changes are present on the right at L4-5. The flexion images demonstrate exacerbation of slight anterolisthesis at L3-4. This is reduced in extension. CT LUMBAR MYELOGRAM FINDINGS: The lumbar spine is imaged from T11-12 through S3. The conus medullaris terminates at L1, within normal limits. There is partial calcification of the L1-2 disc. A vacuum disc is present at L4-5 with chronic endplate sclerotic changes on the right. Focal leftward curvature is present in the lumbar spine at L4-5. Limited imaging of the abdomen demonstrates atherosclerotic calcifications in the aorta without aneurysm. L1-2: A mild broad-based disc bulge is present. There is no significant focal protrusion or stenosis. L2-3: A broad-based disc bulge is present. Asymmetric left-sided facet hypertrophy and ligamentum flavum thickening results in minimal left lateral recess narrowing. The foramina are patent. L3-4: Moderate facet degenerative changes are present. Ligamentum flavum thickening is noted. There is slight anterolisthesis with uncovering of a broad-based disc protrusion. This results in moderate lateral recess narrowing  bilaterally. Moderate left and mild right foraminal stenosis is present. L4-5: A leftward disc protrusion is associated with the vocal scoliosis. Facet hypertrophy and ligamentum flavum thickening is present. Mild to moderate left lateral recess and bilateral foraminal stenosis is present. L5-S1: A shallow central disc protrusion is present without significant stenosis. IMPRESSION: LUMBAR MYELOGRAM IMPRESSION: 1. Moderate central canal stenosis at L3-4 with slight anterolisthesis exaggerated by flexion. 2. Mild lateral recess narrowing at L4-5 secondary to a broad-based disc protrusion 3. Mild left lateral recess narrowing at L2-3. CT LUMBAR MYELOGRAM IMPRESSION: 1. Minimal left lateral recess narrowing at L2-3 secondary to a broad-based disc bulge and left-sided and ligamentum flavum thickening. 2. Moderate lateral recess narrowing bilaterally with moderate left and mild right foraminal stenosis at L3-4. 3. Mild to moderate left lateral recess and bilateral foraminal stenosis at L4-5. 4. Shallow central disc protrusion without significant stenosis at L5-S1. Electronically Signed   By: Gennette Pac M.D.   On: 11/14/2013 14:11    Assessment & Plan:   Nikan was seen today for annual exam, hyperlipidemia and diabetes.  Diagnoses and all orders for this visit:  Type 2 diabetes mellitus with complication, without long-term current use of insulin (HCC)- His A1c is 7.1%, his blood sugars are well-controlled, renal function is normal, I've asked him to  have a diabetic eye exam, will start risk factor reduction with statin and aspirin therapy -     lisinopril (PRINIVIL,ZESTRIL) 10 MG tablet; Take 1 tablet (10 mg total) by mouth daily. -     glipiZIDE (GLUCOTROL XL) 10 MG 24 hr tablet; Take 1 tablet (10 mg total) by mouth daily with breakfast. -     metFORMIN (GLUCOPHAGE) 1000 MG tablet; Take 1 tablet (1,000 mg total) by mouth 2 (two) times daily with a meal. -     Comprehensive metabolic panel; Future -      Hemoglobin A1c; Future -     Microalbumin / creatinine urine ratio; Future -     Ambulatory referral to Ophthalmology -     atorvastatin (LIPITOR) 20 MG tablet; Take 1 tablet (20 mg total) by mouth daily. -     aspirin EC 81 MG tablet; Take 1 tablet (81 mg total) by mouth daily.  Essential hypertension, benign- his blood pressure is well-controlled, electrolytes and renal function are stable. -     lisinopril (PRINIVIL,ZESTRIL) 10 MG tablet; Take 1 tablet (10 mg total) by mouth daily. -     Comprehensive metabolic panel; Future -     CBC with Differential/Platelet; Future -     Urinalysis, Routine w reflex microscopic (not at [facility]); Future -     aspirin EC 81 MG tablet; Take 1 tablet (81 mg total) by mouth daily. -     EKG 12-Lead  Hyperlipidemia with target LDL less than 70- he has not achieved his LDL goal of 70 and he needs primary risk reduction so I've asked him to start taking a statin as well as an aspirin a day -     Lipid panel; Future -     Comprehensive metabolic panel; Future -     TSH; Future -     atorvastatin (LIPITOR) 20 MG tablet; Take 1 tablet (20 mg total) by mouth daily. -     aspirin EC 81 MG tablet; Take 1 tablet (81 mg total) by mouth daily.  BPH (benign prostatic hyperplasia)- he has no suspicious signs or symptoms and his PSA is normal, will continue to follow this without treatment. -     Urinalysis, Routine w reflex microscopic (not at [facility]); Future -     PSA; Future  Need for hepatitis C screening test  Spinal stenosis, lumbar region, without neurogenic claudication -     Ambulatory referral to Neurosurgery  Routine general medical examination at a health care facility   I have discontinued Mr. Provence's doxepin. I have also changed his glipiZIDE and metFORMIN. Additionally, I am having him start on atorvastatin and aspirin EC. Lastly, I am having him maintain his lisinopril.  Meds ordered this encounter  Medications  . lisinopril (PRINIVIL,ZESTRIL) 10 MG  tablet    Sig: Take 1 tablet (10 mg total) by mouth daily.    Dispense:  90 tablet    Refill:  3  . glipiZIDE (GLUCOTROL XL) 10 MG 24 hr tablet    Sig: Take 1 tablet (10 mg total) by mouth daily with breakfast.    Dispense:  90 tablet    Refill:  1  . metFORMIN (GLUCOPHAGE) 1000 MG tablet    Sig: Take 1 tablet (1,000 mg total) by mouth 2 (two) times daily with a meal.    Dispense:  180 tablet    Refill:  1  . atorvastatin (LIPITOR) 20 MG tablet    Sig: Take 1 tablet (20 mg  total) by mouth daily.    Dispense:  90 tablet    Refill:  3  . aspirin EC 81 MG tablet    Sig: Take 1 tablet (81 mg total) by mouth daily.    Dispense:  90 tablet    Refill:  3   See AVS for instructions about healthy living and anticipatory guidance.  Follow-up: Return in about 6 months (around 04/10/2016).  Sanda Linger, MD

## 2015-10-11 ENCOUNTER — Telehealth: Payer: Self-pay

## 2015-10-11 NOTE — Telephone Encounter (Signed)
Axed cologuard form

## 2015-10-13 ENCOUNTER — Encounter: Payer: Self-pay | Admitting: Internal Medicine

## 2015-10-13 NOTE — Assessment & Plan Note (Signed)

## 2015-11-06 MED FILL — metFORMIN HCL 1000 MG TABS: 1000 | 30 days supply | Qty: 60 | Fill #1

## 2015-11-06 MED FILL — ?LISINOPRIL 10 MG TABLET: 10 | 30 days supply | Qty: 30 | Fill #9

## 2015-11-06 MED FILL — ?ATORVASTATIN 20 MG TABLET: 20 | 30 days supply | Qty: 30 | Fill #1

## 2015-11-06 MED FILL — glipiZIDE ER 10 MG TB24: 10 | 30 days supply | Qty: 60 | Fill #4

## 2015-12-09 MED FILL — ?LISINOPRIL 10 MG TABLET: 10 | 30 days supply | Qty: 30 | Fill #0

## 2015-12-09 MED FILL — metFORMIN HCL 1000 MG TABS: 1000 | 30 days supply | Qty: 60 | Fill #2

## 2015-12-09 MED FILL — ?ATORVASTATIN 20 MG TABLET: 20 | 30 days supply | Qty: 30 | Fill #2

## 2015-12-30 MED FILL — ?LISINOPRIL 10 MG TABLET: 10 | 30 days supply | Qty: 30 | Fill #1

## 2015-12-30 MED FILL — ?ATORVASTATIN 20 MG TABLET: 20 | 30 days supply | Qty: 30 | Fill #3

## 2015-12-30 MED FILL — glipiZIDE ER 10 MG TB24: 10 | 30 days supply | Qty: 30 | Fill #0

## 2015-12-30 MED FILL — metFORMIN HCL 1000 MG TABS: 1000 | 30 days supply | Qty: 60 | Fill #3

## 2016-02-18 MED FILL — metFORMIN HCL 1000 MG TABS: 1000 | 30 days supply | Qty: 60 | Fill #4

## 2016-02-18 MED FILL — ATORVASTATIN 20 MG TABLET: 20 | 30 days supply | Qty: 30 | Fill #4

## 2016-02-18 MED FILL — LISINOPRIL 10 MG TABLET: 10 | 30 days supply | Qty: 30 | Fill #2

## 2016-02-20 MED FILL — ACETAMINOPHEN/COD #3 TABLET: 300-30 | 6 days supply | Qty: 18 | Fill #0

## 2016-02-20 MED FILL — AMOXICILLIN 500 MG CAPSULE: 500 | 7 days supply | Qty: 28 | Fill #0

## 2016-03-16 MED FILL — glipiZIDE XL 10 MG TB24: 10 | 30 days supply | Qty: 30 | Fill #1

## 2016-03-16 MED FILL — metFORMIN HCL 1000 MG TABS: 1000 | 30 days supply | Qty: 60 | Fill #5

## 2016-03-16 MED FILL — ATORVASTATIN 20 MG TABLET: 20 | 30 days supply | Qty: 30 | Fill #5

## 2016-03-16 MED FILL — LISINOPRIL 10 MG TABLET: 10 | 30 days supply | Qty: 30 | Fill #3

## 2016-04-15 MED FILL — glipiZIDE XL 10 MG TB24: 10 | 30 days supply | Qty: 30 | Fill #2

## 2016-04-15 MED FILL — ?LISINOPRIL 10 MG TABLET: 10 | 30 days supply | Qty: 30 | Fill #4

## 2016-04-15 MED FILL — ?METFORMIN HCL 1,000 MG TAB: 1000 | 30 days supply | Qty: 60 | Fill #0

## 2016-04-15 MED FILL — ATORVASTATIN 20 MG TABLET: 20 | 30 days supply | Qty: 30 | Fill #6

## 2016-05-14 ENCOUNTER — Other Ambulatory Visit: Payer: Self-pay | Admitting: Internal Medicine

## 2016-05-14 DIAGNOSIS — E118 Type 2 diabetes mellitus with unspecified complications: Secondary | ICD-10-CM

## 2016-05-14 MED FILL — glipiZIDE XL 10 MG TB24: 10 | 30 days supply | Qty: 30 | Fill #3

## 2016-05-14 MED FILL — ?LISINOPRIL 10 MG TABLET: 10 | 30 days supply | Qty: 30 | Fill #5

## 2016-05-14 MED FILL — ATORVASTATIN 20 MG TABLET: 20 | 30 days supply | Qty: 30 | Fill #7

## 2016-05-19 ENCOUNTER — Other Ambulatory Visit: Payer: Self-pay | Admitting: Internal Medicine

## 2016-05-19 DIAGNOSIS — E118 Type 2 diabetes mellitus with unspecified complications: Secondary | ICD-10-CM

## 2016-05-25 ENCOUNTER — Other Ambulatory Visit (INDEPENDENT_AMBULATORY_CARE_PROVIDER_SITE_OTHER): Payer: BLUE CROSS/BLUE SHIELD

## 2016-05-25 ENCOUNTER — Ambulatory Visit (INDEPENDENT_AMBULATORY_CARE_PROVIDER_SITE_OTHER): Payer: BLUE CROSS/BLUE SHIELD | Admitting: Internal Medicine

## 2016-05-25 ENCOUNTER — Ambulatory Visit: Payer: BLUE CROSS/BLUE SHIELD | Admitting: Internal Medicine

## 2016-05-25 VITALS — BP 104/70 | HR 77 | Temp 97.7°F | Ht 69.5 in | Wt 238.2 lb

## 2016-05-25 DIAGNOSIS — E0849 Diabetes mellitus due to underlying condition with other diabetic neurological complication: Secondary | ICD-10-CM | POA: Diagnosis not present

## 2016-05-25 DIAGNOSIS — L989 Disorder of the skin and subcutaneous tissue, unspecified: Secondary | ICD-10-CM

## 2016-05-25 DIAGNOSIS — E781 Pure hyperglyceridemia: Secondary | ICD-10-CM | POA: Insufficient documentation

## 2016-05-25 DIAGNOSIS — E118 Type 2 diabetes mellitus with unspecified complications: Secondary | ICD-10-CM | POA: Diagnosis not present

## 2016-05-25 DIAGNOSIS — Z23 Encounter for immunization: Secondary | ICD-10-CM

## 2016-05-25 DIAGNOSIS — I10 Essential (primary) hypertension: Secondary | ICD-10-CM | POA: Diagnosis not present

## 2016-05-25 DIAGNOSIS — E1140 Type 2 diabetes mellitus with diabetic neuropathy, unspecified: Secondary | ICD-10-CM | POA: Insufficient documentation

## 2016-05-25 DIAGNOSIS — M48061 Spinal stenosis, lumbar region without neurogenic claudication: Secondary | ICD-10-CM

## 2016-05-25 LAB — BASIC METABOLIC PANEL
BUN: 20 mg/dL (ref 6–23)
CO2: 30 mEq/L (ref 19–32)
Calcium: 9.3 mg/dL (ref 8.4–10.5)
Chloride: 100 mEq/L (ref 96–112)
Creatinine, Ser: 1.08 mg/dL (ref 0.40–1.50)
GFR: 72.38 mL/min (ref >60.00)
Glucose, Bld: 154 mg/dL — ABNORMAL HIGH (ref 70–99)
Potassium: 4.4 mEq/L (ref 3.5–5.1)
Sodium: 136 mEq/L (ref 135–145)

## 2016-05-25 LAB — TRIGLYCERIDES: Triglycerides: 146 mg/dL (ref 0.0–149.0)

## 2016-05-25 LAB — HEMOGLOBIN A1C: Hgb A1c MFr Bld: 7 % — ABNORMAL HIGH (ref 4.6–6.5)

## 2016-05-25 MED ORDER — GABAPENTIN 100 MG PO CAPS: 100.0000 mg | ORAL_CAPSULE | Freq: Three times a day (TID) | ORAL | 11 refills | Status: DC

## 2016-05-25 MED FILL — GABAPENTIN 100 MG CAPSULE: 100 | 30 days supply | Qty: 90 | Fill #0

## 2016-05-25 NOTE — Progress Notes (Signed)
Subjective:  Patient ID: Michael Ashley, male    DOB: [birth year]  Age: 66 y.o. MRN: [id number]  CC: Hypertension; Diabetes; and Hyperlipidemia   HPI Michael Ashley presents for follow-up on the above medical problems. He complains of persistent and somewhat worsening low back pain. He has a history of spinal stenosis and was previously told by neurosurgery and that surgery would benefit him. He is ready to reconsider that option at this time. He also complains of numbness in both feet and wants to try gabapentin to see if this will help control his symptoms. He complains of lesions on the dorsum of his right forearm and wants to see a dermatologist to see if they can be removed.  Outpatient Medications Prior to Visit  Medication Sig Dispense Refill  . aspirin EC 81 MG tablet Take 1 tablet (81 mg total) by mouth daily. 90 tablet 3  . atorvastatin (LIPITOR) 20 MG tablet Take 1 tablet (20 mg total) by mouth daily. 90 tablet 3  . glipiZIDE (GLUCOTROL XL) 10 MG 24 hr tablet Take 1 tablet (10 mg total) by mouth daily with breakfast. 90 tablet 1  . lisinopril (PRINIVIL,ZESTRIL) 10 MG tablet Take 1 tablet (10 mg total) by mouth daily. 90 tablet 3  . metFORMIN (GLUCOPHAGE) 1000 MG tablet Take 1 tablet (1,000 mg total) by mouth 2 (two) times daily with a meal. 180 tablet 1   No facility-administered medications prior to visit.     ROS Review of Systems  Constitutional: Negative.  Negative for appetite change, diaphoresis, fatigue and unexpected weight change.  HENT: Negative.  Negative for trouble swallowing.   Eyes: Negative for visual disturbance.  Respiratory: Negative.  Negative for chest tightness, shortness of breath, wheezing and stridor.   Cardiovascular: Negative for chest pain, palpitations and leg swelling.  Gastrointestinal: Negative.  Negative for abdominal pain, constipation, diarrhea, nausea and vomiting.  Endocrine: Negative.  Negative for cold intolerance, heat intolerance, polydipsia,  polyphagia and polyuria.  Genitourinary: Negative.   Musculoskeletal: Positive for back pain. Negative for joint swelling and myalgias.  Skin: Negative.   Allergic/Immunologic: Negative.   Neurological: Positive for numbness. Negative for dizziness, weakness and headaches.  Hematological: Negative for adenopathy. Does not bruise/bleed easily.  Psychiatric/Behavioral: Negative.     Objective:  BP 104/70 (BP Location: Left Arm, Patient Position: Sitting, Cuff Size: Large)   Pulse 77   Temp 97.7 F (36.5 C) (Oral)   Ht 5' 9.5" (1.765 m)   Wt 238 lb 4 oz (108.1 kg)   SpO2 96%   BMI 34.68 kg/m   BP Readings from Last 3 Encounters:  05/25/16 104/70  10/10/15 110/70  07/12/14 120/78    Wt Readings from Last 3 Encounters:  05/25/16 238 lb 4 oz (108.1 kg)  10/10/15 240 lb (108.9 kg)  07/12/14 239 lb (108.4 kg)    Physical Exam  Constitutional: He is oriented to person, place, and time. No distress.  HENT:  Mouth/Throat: Oropharynx is clear and moist. No oropharyngeal exudate.  Eyes: Conjunctivae are normal. Right eye exhibits no discharge. Left eye exhibits no discharge. No scleral icterus.  Neck: Normal range of motion. Neck supple. No JVD present. No tracheal deviation present. No thyromegaly present.  Cardiovascular: Normal rate, regular rhythm, normal heart sounds and intact distal pulses.  Exam reveals no gallop and no friction rub.   No murmur heard. Pulmonary/Chest: Effort normal and breath sounds normal. No stridor. No respiratory distress. He has no wheezes. He has no rales. He exhibits  no tenderness.  Abdominal: Soft. Bowel sounds are normal. He exhibits no distension and no mass. There is no tenderness. There is no rebound and no guarding.  Musculoskeletal: Normal range of motion. He exhibits deformity. He exhibits no edema or tenderness.  Lymphadenopathy:    He has no cervical adenopathy.  Neurological: He is oriented to person, place, and time.  Skin: Skin is warm  and dry. No rash noted. He is not diaphoretic. No erythema. No pallor.  Cressy lesions noted on the dorsum of his right forearm consistent with either seborrheic keratoses, actinic keratosis, or basal cell carcinoma  Vitals reviewed.   Lab Results  Component Value Date   WBC 6.1 10/10/2015   HGB 14.1 10/10/2015   HCT 41.7 10/10/2015   PLT 203.0 10/10/2015   GLUCOSE 154 (H) 05/25/2016   CHOL 152 10/10/2015   TRIG 146.0 05/25/2016   HDL 34.70 (L) 10/10/2015   LDLDIRECT 84.0 10/10/2015   LDLCALC 83 07/12/2014   ALT 27 10/10/2015   AST 26 10/10/2015   NA 136 05/25/2016   K 4.4 05/25/2016   CL 100 05/25/2016   CREATININE 1.08 05/25/2016   BUN 20 05/25/2016   CO2 30 05/25/2016   TSH 2.18 10/10/2015   PSA 1.36 10/10/2015   HGBA1C 7.0 (H) 05/25/2016   MICROALBUR <0.7 10/10/2015    Ct Lumbar Spine W Contrast  Result Date: 11/14/2013 CLINICAL DATA:  Lower extremity weakness. Difficulty and extending at the ankle joints. EXAM: LUMBAR MYELOGRAM FLUOROSCOPY TIME:  1 min 45 seconds PROCEDURE: After thorough discussion of risks and benefits of the procedure including bleeding, infection, injury to nerves, blood vessels, adjacent structures as well as headache and CSF leak, written and oral informed consent was obtained. Consent was obtained by Dr. Gennette Pac. Time out form was completed. Patient was positioned prone on the fluoroscopy table. Local anesthesia was provided with 1% lidocaine without epinephrine after prepped and draped in the usual sterile fashion. Puncture was performed at L2-3 using a 3 1/2 inch 22-gauge spinal needle via right paramedian approach. Using a single pass through the dura, the needle was placed within the thecal sac, with return of clear CSF. 15 mL of Omnipaque-180 was injected into the thecal sac, with normal opacification of the nerve roots and cauda equina consistent with free flow within the subarachnoid space. I personally performed the lumbar puncture and  administered the intrathecal contrast. I also personally supervised acquisition of the myelogram images. TECHNIQUE: Contiguous axial images were obtained through the Lumbar spine after the intrathecal infusion of infusion. Coronal and sagittal reconstructions were obtained of the axial image sets. COMPARISON:  MRI lumbar spine 10/18/2012. FINDINGS: LUMBAR MYELOGRAM FINDINGS: Five non rib-bearing lumbar type vertebral bodies are present. Moderate central canal stenosis is present at L3-4 with lateral recess narrowing bilaterally, right greater than left. There is mild leftward curvature of the lumbar spine at L4-5. Mild lateral recess narrowing is present at L4-5 is well with a broad-based disc protrusion. Mild disc bulging is present at L5-S1 without significant stenosis. Mild disc bulging is present at L2-3 with minimal left lateral recess narrowing. Endplate marrow changes are present on the right at L4-5. The flexion images demonstrate exacerbation of slight anterolisthesis at L3-4. This is reduced in extension. CT LUMBAR MYELOGRAM FINDINGS: The lumbar spine is imaged from T11-12 through S3. The conus medullaris terminates at L1, within normal limits. There is partial calcification of the L1-2 disc. A vacuum disc is present at L4-5 with chronic endplate sclerotic changes  on the right. Focal leftward curvature is present in the lumbar spine at L4-5. Limited imaging of the abdomen demonstrates atherosclerotic calcifications in the aorta without aneurysm. L1-2: A mild broad-based disc bulge is present. There is no significant focal protrusion or stenosis. L2-3: A broad-based disc bulge is present. Asymmetric left-sided facet hypertrophy and ligamentum flavum thickening results in minimal left lateral recess narrowing. The foramina are patent. L3-4: Moderate facet degenerative changes are present. Ligamentum flavum thickening is noted. There is slight anterolisthesis with uncovering of a broad-based disc protrusion.  This results in moderate lateral recess narrowing bilaterally. Moderate left and mild right foraminal stenosis is present. L4-5: A leftward disc protrusion is associated with the vocal scoliosis. Facet hypertrophy and ligamentum flavum thickening is present. Mild to moderate left lateral recess and bilateral foraminal stenosis is present. L5-S1: A shallow central disc protrusion is present without significant stenosis. IMPRESSION: LUMBAR MYELOGRAM IMPRESSION: 1. Moderate central canal stenosis at L3-4 with slight anterolisthesis exaggerated by flexion. 2. Mild lateral recess narrowing at L4-5 secondary to a broad-based disc protrusion 3. Mild left lateral recess narrowing at L2-3. CT LUMBAR MYELOGRAM IMPRESSION: 1. Minimal left lateral recess narrowing at L2-3 secondary to a broad-based disc bulge and left-sided and ligamentum flavum thickening. 2. Moderate lateral recess narrowing bilaterally with moderate left and mild right foraminal stenosis at L3-4. 3. Mild to moderate left lateral recess and bilateral foraminal stenosis at L4-5. 4. Shallow central disc protrusion without significant stenosis at L5-S1. Electronically Signed   By: Gennette Pachris  Mattern M.D.   On: 11/14/2013 14:11   Dg Myelography Lumbar Inj Lumbosacral  Result Date: 11/14/2013 CLINICAL DATA:  Lower extremity weakness. Difficulty and extending at the ankle joints. EXAM: LUMBAR MYELOGRAM FLUOROSCOPY TIME:  1 min 45 seconds PROCEDURE: After thorough discussion of risks and benefits of the procedure including bleeding, infection, injury to nerves, blood vessels, adjacent structures as well as headache and CSF leak, written and oral informed consent was obtained. Consent was obtained by Dr. Gennette Pachris Mattern. Time out form was completed. Patient was positioned prone on the fluoroscopy table. Local anesthesia was provided with 1% lidocaine without epinephrine after prepped and draped in the usual sterile fashion. Puncture was performed at L2-3 using a 3 1/2  inch 22-gauge spinal needle via right paramedian approach. Using a single pass through the dura, the needle was placed within the thecal sac, with return of clear CSF. 15 mL of Omnipaque-180 was injected into the thecal sac, with normal opacification of the nerve roots and cauda equina consistent with free flow within the subarachnoid space. I personally performed the lumbar puncture and administered the intrathecal contrast. I also personally supervised acquisition of the myelogram images. TECHNIQUE: Contiguous axial images were obtained through the Lumbar spine after the intrathecal infusion of infusion. Coronal and sagittal reconstructions were obtained of the axial image sets. COMPARISON:  MRI lumbar spine 10/18/2012. FINDINGS: LUMBAR MYELOGRAM FINDINGS: Five non rib-bearing lumbar type vertebral bodies are present. Moderate central canal stenosis is present at L3-4 with lateral recess narrowing bilaterally, right greater than left. There is mild leftward curvature of the lumbar spine at L4-5. Mild lateral recess narrowing is present at L4-5 is well with a broad-based disc protrusion. Mild disc bulging is present at L5-S1 without significant stenosis. Mild disc bulging is present at L2-3 with minimal left lateral recess narrowing. Endplate marrow changes are present on the right at L4-5. The flexion images demonstrate exacerbation of slight anterolisthesis at L3-4. This is reduced in extension. CT LUMBAR MYELOGRAM  FINDINGS: The lumbar spine is imaged from T11-12 through S3. The conus medullaris terminates at L1, within normal limits. There is partial calcification of the L1-2 disc. A vacuum disc is present at L4-5 with chronic endplate sclerotic changes on the right. Focal leftward curvature is present in the lumbar spine at L4-5. Limited imaging of the abdomen demonstrates atherosclerotic calcifications in the aorta without aneurysm. L1-2: A mild broad-based disc bulge is present. There is no significant focal  protrusion or stenosis. L2-3: A broad-based disc bulge is present. Asymmetric left-sided facet hypertrophy and ligamentum flavum thickening results in minimal left lateral recess narrowing. The foramina are patent. L3-4: Moderate facet degenerative changes are present. Ligamentum flavum thickening is noted. There is slight anterolisthesis with uncovering of a broad-based disc protrusion. This results in moderate lateral recess narrowing bilaterally. Moderate left and mild right foraminal stenosis is present. L4-5: A leftward disc protrusion is associated with the vocal scoliosis. Facet hypertrophy and ligamentum flavum thickening is present. Mild to moderate left lateral recess and bilateral foraminal stenosis is present. L5-S1: A shallow central disc protrusion is present without significant stenosis. IMPRESSION: LUMBAR MYELOGRAM IMPRESSION: 1. Moderate central canal stenosis at L3-4 with slight anterolisthesis exaggerated by flexion. 2. Mild lateral recess narrowing at L4-5 secondary to a broad-based disc protrusion 3. Mild left lateral recess narrowing at L2-3. CT LUMBAR MYELOGRAM IMPRESSION: 1. Minimal left lateral recess narrowing at L2-3 secondary to a broad-based disc bulge and left-sided and ligamentum flavum thickening. 2. Moderate lateral recess narrowing bilaterally with moderate left and mild right foraminal stenosis at L3-4. 3. Mild to moderate left lateral recess and bilateral foraminal stenosis at L4-5. 4. Shallow central disc protrusion without significant stenosis at L5-S1. Electronically Signed   By: Gennette Pac M.D.   On: 11/14/2013 14:11    Assessment & Plan:   Nabeel was seen today for hypertension, diabetes and hyperlipidemia.  Diagnoses and all orders for this visit:  Type 2 diabetes mellitus with complication, without long-term current use of insulin (HCC)- his A1c is 7.0%, his blood sugars are adequately well controlled. -     Basic metabolic panel; Future -     Hemoglobin  A1c; Future -     Ambulatory referral to Ophthalmology -     metFORMIN (GLUCOPHAGE) 1000 MG tablet; Take 1 tablet (1,000 mg total) by mouth 2 (two) times daily with a meal.  Pure hyperglyceridemia- his triglyceride levels are normal now, I praised him for his lifestyle modifications. -     Triglycerides; Future  Essential hypertension, benign- his blood pressure is adequately well controlled, electrolytes and renal function are normal. 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  Skin lesion of right arm -     Ambulatory referral to Dermatology  Need for prophylactic vaccination and inoculation against influenza -     Flu vaccine HIGH DOSE PF (Fluzone High dose)  Spinal stenosis, lumbar region, without neurogenic claudication -     Ambulatory referral to Neurosurgery   I am having Michael Ashley start on gabapentin. I am also having him maintain his lisinopril, glipiZIDE, atorvastatin, aspirin EC, and metFORMIN.  Meds ordered this encounter  Medications  . gabapentin (NEURONTIN) 100 MG capsule    Sig: Take 1 capsule (100 mg total) by mouth 3 (three) times  daily.    Dispense:  90 capsule    Refill:  11  . metFORMIN (GLUCOPHAGE) 1000 MG tablet    Sig: Take 1 tablet (1,000 mg total) by mouth 2 (two) times daily with a meal.    Dispense:  180 tablet    Refill:  1     Follow-up: Return in about 6 months (around 11/22/2016).  Sanda Linger, MD

## 2016-05-25 NOTE — Progress Notes (Signed)
Pre visit review using our clinic review tool, if applicable. No additional management support is needed unless otherwise documented below in the visit note. 

## 2016-05-25 NOTE — Patient Instructions (Signed)

## 2016-05-26 ENCOUNTER — Encounter: Payer: Self-pay | Admitting: Internal Medicine

## 2016-05-26 ENCOUNTER — Other Ambulatory Visit: Payer: Self-pay | Admitting: Internal Medicine

## 2016-05-26 DIAGNOSIS — E118 Type 2 diabetes mellitus with unspecified complications: Secondary | ICD-10-CM

## 2016-05-26 MED ORDER — METFORMIN HCL 1000 MG PO TABS: 1000.0000 mg | ORAL_TABLET | Freq: Two times a day (BID) | ORAL | 1 refills | Status: DC

## 2016-05-26 MED FILL — metFORMIN HCL 1000 MG TABS: 1000 | 30 days supply | Qty: 60 | Fill #0

## 2016-06-23 ENCOUNTER — Ambulatory Visit (INDEPENDENT_AMBULATORY_CARE_PROVIDER_SITE_OTHER): Payer: BLUE CROSS/BLUE SHIELD | Admitting: Internal Medicine

## 2016-06-23 ENCOUNTER — Encounter: Payer: Self-pay | Admitting: Internal Medicine

## 2016-06-23 VITALS — BP 110/70 | HR 83 | Temp 98.3°F | Resp 16 | Ht 69.5 in | Wt 234.0 lb

## 2016-06-23 DIAGNOSIS — J040 Acute laryngitis: Secondary | ICD-10-CM | POA: Diagnosis not present

## 2016-06-23 DIAGNOSIS — B9789 Other viral agents as the cause of diseases classified elsewhere: Secondary | ICD-10-CM

## 2016-06-23 DIAGNOSIS — J069 Acute upper respiratory infection, unspecified: Secondary | ICD-10-CM

## 2016-06-23 MED ORDER — PROMETHAZINE-DM 6.25-15 MG/5ML PO SYRP: 5.0000 mL | ORAL_SOLUTION | Freq: Four times a day (QID) | ORAL | 0 refills | Status: DC | PRN

## 2016-06-23 MED ORDER — METHYLPREDNISOLONE 4 MG PO TBPK: ORAL_TABLET | ORAL | 0 refills | Status: DC

## 2016-06-23 NOTE — Progress Notes (Signed)
Pre visit review using our clinic review tool, if applicable. No additional management support is needed unless otherwise documented below in the visit note. 

## 2016-06-23 NOTE — Patient Instructions (Signed)
Upper Respiratory Infection, Adult Most upper respiratory infections (URIs) are caused by a virus. A URI affects the nose, throat, and upper air passages. The most common type of URI is often called "the common cold." Follow these instructions at home:  Take medicines only as told by your doctor.  Gargle warm saltwater or take cough drops to comfort your throat as told by your doctor.  Use a warm mist humidifier or inhale steam from a shower to increase air moisture. This may make it easier to breathe.  Drink enough fluid to keep your pee (urine) clear or pale yellow.  Eat soups and other clear broths.  Have a healthy diet.  Rest as needed.  Go back to work when your fever is gone or your doctor says it is okay.  You may need to stay home longer to avoid giving your URI to others.  You can also wear a face mask and wash your hands often to prevent spread of the virus.  Use your inhaler more if you have asthma.  Do not use any tobacco products, including cigarettes, chewing tobacco, or electronic cigarettes. If you need help quitting, ask your doctor. Contact a doctor if:  You are getting worse, not better.  Your symptoms are not helped by medicine.  You have chills.  You are getting more short of breath.  You have brown or red mucus.  You have yellow or brown discharge from your nose.  You have pain in your face, especially when you bend forward.  You have a fever.  You have puffy (swollen) neck glands.  You have pain while swallowing.  You have white areas in the back of your throat. Get help right away if:  You have very bad or constant:  Headache.  Ear pain.  Pain in your forehead, behind your eyes, and over your cheekbones (sinus pain).  Chest pain.  You have long-lasting (chronic) lung disease and any of the following:  Wheezing.  Long-lasting cough.  Coughing up blood.  A change in your usual mucus.  You have a stiff neck.  You have  changes in your:  Vision.  Hearing.  Thinking.  Mood. This information is not intended to replace advice given to you by your health care provider. Make sure you discuss any questions you have with your health care provider. Document Released: 10/07/2007 Document Revised: 12/22/2015 Document Reviewed: 07/26/2013 [employer] Interactive Patient Education  2017 [employer]  

## 2016-06-23 NOTE — Progress Notes (Signed)
Subjective:  Patient ID: Michael Ashley, male    DOB: [birth year]  Age: 66 y.o. MRN: [id number]  CC: URI   HPI Michael Ashley presents for a 5 day hx of NP cough and laryngitis.  Outpatient Medications Prior to Visit  Medication Sig Dispense Refill  . aspirin EC 81 MG tablet Take 1 tablet (81 mg total) by mouth daily. 90 tablet 3  . atorvastatin (LIPITOR) 20 MG tablet Take 1 tablet (20 mg total) by mouth daily. 90 tablet 3  . gabapentin (NEURONTIN) 100 MG capsule Take 1 capsule (100 mg total) by mouth 3 (three) times daily. 90 capsule 11  . glipiZIDE (GLUCOTROL XL) 10 MG 24 hr tablet Take 1 tablet (10 mg total) by mouth daily with breakfast. 90 tablet 1  . lisinopril (PRINIVIL,ZESTRIL) 10 MG tablet Take 1 tablet (10 mg total) by mouth daily. 90 tablet 3  . metFORMIN (GLUCOPHAGE) 1000 MG tablet Take 1 tablet (1,000 mg total) by mouth 2 (two) times daily with a meal. 180 tablet 1   No facility-administered medications prior to visit.     ROS Review of Systems  Constitutional: Negative for activity change, chills, diaphoresis, fatigue and fever.  HENT: Positive for voice change. Negative for facial swelling, sinus pressure, sore throat and trouble swallowing.   Respiratory: Positive for cough. Negative for chest tightness, shortness of breath and wheezing.   Cardiovascular: Negative for chest pain and leg swelling.  Gastrointestinal: Negative.  Negative for abdominal pain, diarrhea, nausea and vomiting.  Endocrine: Negative.   Genitourinary: Negative.  Negative for difficulty urinating.  Musculoskeletal: Negative for arthralgias and myalgias.  Skin: Negative.  Negative for color change and rash.  Allergic/Immunologic: Negative.   Neurological: Negative.   Hematological: Negative.  Negative for adenopathy. Does not bruise/bleed easily.  Psychiatric/Behavioral: Negative.     Objective:  BP 110/70 (BP Location: Left Arm, Patient Position: Sitting, Cuff Size: Normal)   Pulse 83   Temp 98.3  F (36.8 C) (Oral)   Resp 16   Ht 5' 9.5" (1.765 m)   Wt 234 lb (106.1 kg)   SpO2 97%   BMI 34.06 kg/m   BP Readings from Last 3 Encounters:  06/23/16 110/70  05/25/16 104/70  10/10/15 110/70    Wt Readings from Last 3 Encounters:  06/23/16 234 lb (106.1 kg)  05/25/16 238 lb 4 oz (108.1 kg)  10/10/15 240 lb (108.9 kg)    Physical Exam  Constitutional: He is oriented to person, place, and time.  Non-toxic appearance. He does not have a sickly appearance. He does not appear ill. No distress.  He speaks in a whispered voice  HENT:  Mouth/Throat: Oropharynx is clear and moist and mucous membranes are normal. Mucous membranes are not pale, not dry and not cyanotic. No trismus in the jaw. No oropharyngeal exudate, posterior oropharyngeal edema, posterior oropharyngeal erythema or tonsillar abscesses.  Eyes: Conjunctivae are normal. Right eye exhibits no discharge. Left eye exhibits no discharge. No scleral icterus.  Neck: Normal range of motion. Neck supple. No JVD present. No tracheal deviation present. No thyromegaly present.  Cardiovascular: Normal rate, regular rhythm, normal heart sounds and intact distal pulses.  Exam reveals no gallop and no friction rub.   No murmur heard. Pulmonary/Chest: Effort normal and breath sounds normal. No stridor. No respiratory distress. He has no wheezes. He has no rales. He exhibits no tenderness.  Abdominal: Soft. Bowel sounds are normal. He exhibits no distension and no mass. There is no tenderness. There is  no rebound and no guarding.  Musculoskeletal: Normal range of motion. He exhibits no edema, tenderness or deformity.  Lymphadenopathy:       Head (right side): No submandibular and no occipital adenopathy present.       Head (left side): No submandibular adenopathy present.    He has no cervical adenopathy.    He has no axillary adenopathy.  Neurological: He is oriented to person, place, and time.  Skin: Skin is warm and dry. No rash noted.  He is not diaphoretic. No erythema. No pallor.  Vitals reviewed.   Lab Results  Component Value Date   WBC 6.1 10/10/2015   HGB 14.1 10/10/2015   HCT 41.7 10/10/2015   PLT 203.0 10/10/2015   GLUCOSE 154 (H) 05/25/2016   CHOL 152 10/10/2015   TRIG 146.0 05/25/2016   HDL 34.70 (L) 10/10/2015   LDLDIRECT 84.0 10/10/2015   LDLCALC 83 07/12/2014   ALT 27 10/10/2015   AST 26 10/10/2015   NA 136 05/25/2016   K 4.4 05/25/2016   CL 100 05/25/2016   CREATININE 1.08 05/25/2016   BUN 20 05/25/2016   CO2 30 05/25/2016   TSH 2.18 10/10/2015   PSA 1.36 10/10/2015   HGBA1C 7.0 (H) 05/25/2016   MICROALBUR <0.7 10/10/2015    Ct Lumbar Spine W Contrast  Result Date: 11/14/2013 CLINICAL DATA:  Lower extremity weakness. Difficulty and extending at the ankle joints. EXAM: LUMBAR MYELOGRAM FLUOROSCOPY TIME:  1 min 45 seconds PROCEDURE: After thorough discussion of risks and benefits of the procedure including bleeding, infection, injury to nerves, blood vessels, adjacent structures as well as headache and CSF leak, written and oral informed consent was obtained. Consent was obtained by Dr. Gennette Pac. Time out form was completed. Patient was positioned prone on the fluoroscopy table. Local anesthesia was provided with 1% lidocaine without epinephrine after prepped and draped in the usual sterile fashion. Puncture was performed at L2-3 using a 3 1/2 inch 22-gauge spinal needle via right paramedian approach. Using a single pass through the dura, the needle was placed within the thecal sac, with return of clear CSF. 15 mL of Omnipaque-180 was injected into the thecal sac, with normal opacification of the nerve roots and cauda equina consistent with free flow within the subarachnoid space. I personally performed the lumbar puncture and administered the intrathecal contrast. I also personally supervised acquisition of the myelogram images. TECHNIQUE: Contiguous axial images were obtained through the Lumbar  spine after the intrathecal infusion of infusion. Coronal and sagittal reconstructions were obtained of the axial image sets. COMPARISON:  MRI lumbar spine 10/18/2012. FINDINGS: LUMBAR MYELOGRAM FINDINGS: Five non rib-bearing lumbar type vertebral bodies are present. Moderate central canal stenosis is present at L3-4 with lateral recess narrowing bilaterally, right greater than left. There is mild leftward curvature of the lumbar spine at L4-5. Mild lateral recess narrowing is present at L4-5 is well with a broad-based disc protrusion. Mild disc bulging is present at L5-S1 without significant stenosis. Mild disc bulging is present at L2-3 with minimal left lateral recess narrowing. Endplate marrow changes are present on the right at L4-5. The flexion images demonstrate exacerbation of slight anterolisthesis at L3-4. This is reduced in extension. CT LUMBAR MYELOGRAM FINDINGS: The lumbar spine is imaged from T11-12 through S3. The conus medullaris terminates at L1, within normal limits. There is partial calcification of the L1-2 disc. A vacuum disc is present at L4-5 with chronic endplate sclerotic changes on the right. Focal leftward curvature is present in  the lumbar spine at L4-5. Limited imaging of the abdomen demonstrates atherosclerotic calcifications in the aorta without aneurysm. L1-2: A mild broad-based disc bulge is present. There is no significant focal protrusion or stenosis. L2-3: A broad-based disc bulge is present. Asymmetric left-sided facet hypertrophy and ligamentum flavum thickening results in minimal left lateral recess narrowing. The foramina are patent. L3-4: Moderate facet degenerative changes are present. Ligamentum flavum thickening is noted. There is slight anterolisthesis with uncovering of a broad-based disc protrusion. This results in moderate lateral recess narrowing bilaterally. Moderate left and mild right foraminal stenosis is present. L4-5: A leftward disc protrusion is associated  with the vocal scoliosis. Facet hypertrophy and ligamentum flavum thickening is present. Mild to moderate left lateral recess and bilateral foraminal stenosis is present. L5-S1: A shallow central disc protrusion is present without significant stenosis. IMPRESSION: LUMBAR MYELOGRAM IMPRESSION: 1. Moderate central canal stenosis at L3-4 with slight anterolisthesis exaggerated by flexion. 2. Mild lateral recess narrowing at L4-5 secondary to a broad-based disc protrusion 3. Mild left lateral recess narrowing at L2-3. CT LUMBAR MYELOGRAM IMPRESSION: 1. Minimal left lateral recess narrowing at L2-3 secondary to a broad-based disc bulge and left-sided and ligamentum flavum thickening. 2. Moderate lateral recess narrowing bilaterally with moderate left and mild right foraminal stenosis at L3-4. 3. Mild to moderate left lateral recess and bilateral foraminal stenosis at L4-5. 4. Shallow central disc protrusion without significant stenosis at L5-S1. Electronically Signed   By: Gennette Pac M.D.   On: 11/14/2013 14:11   Dg Myelography Lumbar Inj Lumbosacral  Result Date: 11/14/2013 CLINICAL DATA:  Lower extremity weakness. Difficulty and extending at the ankle joints. EXAM: LUMBAR MYELOGRAM FLUOROSCOPY TIME:  1 min 45 seconds PROCEDURE: After thorough discussion of risks and benefits of the procedure including bleeding, infection, injury to nerves, blood vessels, adjacent structures as well as headache and CSF leak, written and oral informed consent was obtained. Consent was obtained by Dr. Gennette Pac. Time out form was completed. Patient was positioned prone on the fluoroscopy table. Local anesthesia was provided with 1% lidocaine without epinephrine after prepped and draped in the usual sterile fashion. Puncture was performed at L2-3 using a 3 1/2 inch 22-gauge spinal needle via right paramedian approach. Using a single pass through the dura, the needle was placed within the thecal sac, with return of clear CSF. 15  mL of Omnipaque-180 was injected into the thecal sac, with normal opacification of the nerve roots and cauda equina consistent with free flow within the subarachnoid space. I personally performed the lumbar puncture and administered the intrathecal contrast. I also personally supervised acquisition of the myelogram images. TECHNIQUE: Contiguous axial images were obtained through the Lumbar spine after the intrathecal infusion of infusion. Coronal and sagittal reconstructions were obtained of the axial image sets. COMPARISON:  MRI lumbar spine 10/18/2012. FINDINGS: LUMBAR MYELOGRAM FINDINGS: Five non rib-bearing lumbar type vertebral bodies are present. Moderate central canal stenosis is present at L3-4 with lateral recess narrowing bilaterally, right greater than left. There is mild leftward curvature of the lumbar spine at L4-5. Mild lateral recess narrowing is present at L4-5 is well with a broad-based disc protrusion. Mild disc bulging is present at L5-S1 without significant stenosis. Mild disc bulging is present at L2-3 with minimal left lateral recess narrowing. Endplate marrow changes are present on the right at L4-5. The flexion images demonstrate exacerbation of slight anterolisthesis at L3-4. This is reduced in extension. CT LUMBAR MYELOGRAM FINDINGS: The lumbar spine is imaged from T11-12 through  S3. The conus medullaris terminates at L1, within normal limits. There is partial calcification of the L1-2 disc. A vacuum disc is present at L4-5 with chronic endplate sclerotic changes on the right. Focal leftward curvature is present in the lumbar spine at L4-5. Limited imaging of the abdomen demonstrates atherosclerotic calcifications in the aorta without aneurysm. L1-2: A mild broad-based disc bulge is present. There is no significant focal protrusion or stenosis. L2-3: A broad-based disc bulge is present. Asymmetric left-sided facet hypertrophy and ligamentum flavum thickening results in minimal left lateral  recess narrowing. The foramina are patent. L3-4: Moderate facet degenerative changes are present. Ligamentum flavum thickening is noted. There is slight anterolisthesis with uncovering of a broad-based disc protrusion. This results in moderate lateral recess narrowing bilaterally. Moderate left and mild right foraminal stenosis is present. L4-5: A leftward disc protrusion is associated with the vocal scoliosis. Facet hypertrophy and ligamentum flavum thickening is present. Mild to moderate left lateral recess and bilateral foraminal stenosis is present. L5-S1: A shallow central disc protrusion is present without significant stenosis. IMPRESSION: LUMBAR MYELOGRAM IMPRESSION: 1. Moderate central canal stenosis at L3-4 with slight anterolisthesis exaggerated by flexion. 2. Mild lateral recess narrowing at L4-5 secondary to a broad-based disc protrusion 3. Mild left lateral recess narrowing at L2-3. CT LUMBAR MYELOGRAM IMPRESSION: 1. Minimal left lateral recess narrowing at L2-3 secondary to a broad-based disc bulge and left-sided and ligamentum flavum thickening. 2. Moderate lateral recess narrowing bilaterally with moderate left and mild right foraminal stenosis at L3-4. 3. Mild to moderate left lateral recess and bilateral foraminal stenosis at L4-5. 4. Shallow central disc protrusion without significant stenosis at L5-S1. Electronically Signed   By: Gennette Pac M.D.   On: 11/14/2013 14:11    Assessment & Plan:   Michael Ashley was seen today for uri.  Diagnoses and all orders for this visit:  Laryngitis, acute- will try steroids for sx relief, voice rest encouraged  Viral URI with cough- will control the sx's with phenergan-dm  Other orders -     promethazine-dextromethorphan (PROMETHAZINE-DM) 6.25-15 MG/5ML syrup; Take 5 mLs by mouth 4 (four) times daily as needed for cough. -     methylPREDNISolone (MEDROL DOSEPAK) 4 MG TBPK tablet; TAKE AS DIRECTED   I am having Mr. Weigel start on  promethazine-dextromethorphan and methylPREDNISolone. I am also having him maintain his lisinopril, glipiZIDE, atorvastatin, aspirin EC, gabapentin, and metFORMIN.  Meds ordered this encounter  Medications  . promethazine-dextromethorphan (PROMETHAZINE-DM) 6.25-15 MG/5ML syrup    Sig: Take 5 mLs by mouth 4 (four) times daily as needed for cough.    Dispense:  118 mL    Refill:  0  . methylPREDNISolone (MEDROL DOSEPAK) 4 MG TBPK tablet    Sig: TAKE AS DIRECTED    Dispense:  21 tablet    Refill:  0     Follow-up: Return if symptoms worsen or fail to improve.  Sanda Linger, MD

## 2016-07-06 ENCOUNTER — Other Ambulatory Visit: Payer: Self-pay

## 2016-07-06 ENCOUNTER — Telehealth: Payer: Self-pay | Admitting: Internal Medicine

## 2016-07-06 DIAGNOSIS — E118 Type 2 diabetes mellitus with unspecified complications: Secondary | ICD-10-CM

## 2016-07-06 MED ORDER — METFORMIN HCL 1000 MG PO TABS: 1000.0000 mg | ORAL_TABLET | Freq: Two times a day (BID) | ORAL | 1 refills | Status: DC

## 2016-07-06 NOTE — Telephone Encounter (Signed)
Pt called and said that the pharmacy did not receive the prescription for Metfomin. He would like this to be resent to [facility] [place]. [facility]

## 2016-07-06 NOTE — Telephone Encounter (Signed)
Sent to [facility]

## 2016-10-11 ENCOUNTER — Encounter: Payer: Self-pay | Admitting: Internal Medicine

## 2016-10-11 LAB — COLOGUARD

## 2016-10-26 ENCOUNTER — Telehealth: Payer: Self-pay | Admitting: *Deleted

## 2016-10-26 ENCOUNTER — Other Ambulatory Visit: Payer: Self-pay | Admitting: Internal Medicine

## 2016-10-26 DIAGNOSIS — I10 Essential (primary) hypertension: Secondary | ICD-10-CM

## 2016-10-26 DIAGNOSIS — E785 Hyperlipidemia, unspecified: Secondary | ICD-10-CM

## 2016-10-26 DIAGNOSIS — E118 Type 2 diabetes mellitus with unspecified complications: Secondary | ICD-10-CM

## 2016-10-26 MED ORDER — ATORVASTATIN CALCIUM 20 MG PO TABS: 20.0000 mg | ORAL_TABLET | Freq: Every day | ORAL | 1 refills | Status: DC

## 2016-10-26 MED ORDER — LISINOPRIL 10 MG PO TABS: 10.0000 mg | ORAL_TABLET | Freq: Every day | ORAL | 1 refills | Status: DC

## 2016-10-26 NOTE — Telephone Encounter (Signed)
Which pharmacy?

## 2016-10-26 NOTE — Telephone Encounter (Signed)
Pt left msg on triage requesting refills on his atorvastatin and Lisinopril...Raechel Chute/lmb

## 2016-10-26 NOTE — Telephone Encounter (Signed)
Called pt back no answer & can't leave vm...Michael Ashley/lmb

## 2016-10-27 ENCOUNTER — Other Ambulatory Visit: Payer: Self-pay | Admitting: Internal Medicine

## 2016-10-27 MED ORDER — ATORVASTATIN CALCIUM 20 MG PO TABS: 20.0000 mg | ORAL_TABLET | Freq: Every day | ORAL | 1 refills | Status: DC

## 2016-10-27 MED ORDER — LISINOPRIL 10 MG PO TABS: 10.0000 mg | ORAL_TABLET | Freq: Every day | ORAL | 1 refills | Status: DC

## 2016-10-27 NOTE — Telephone Encounter (Signed)
Resent rx's to correct pharmacy MD sent rx's to [facility] pt is no longer using...Raechel Chute/lmb

## 2016-10-27 NOTE — Telephone Encounter (Signed)
Called pt verified which pharmacy he is currently using. Pt is using [facility]...Raechel Chute/LMB

## 2016-10-28 ENCOUNTER — Other Ambulatory Visit: Payer: Self-pay | Admitting: Internal Medicine

## 2016-10-28 DIAGNOSIS — E118 Type 2 diabetes mellitus with unspecified complications: Secondary | ICD-10-CM

## 2016-11-24 ENCOUNTER — Telehealth: Payer: Self-pay | Admitting: Internal Medicine

## 2016-11-24 DIAGNOSIS — E118 Type 2 diabetes mellitus with unspecified complications: Secondary | ICD-10-CM

## 2016-11-24 MED ORDER — GLIPIZIDE ER 10 MG PO TB24: 10.0000 mg | ORAL_TABLET | Freq: Every day | ORAL | 0 refills | Status: DC

## 2016-11-24 NOTE — Telephone Encounter (Signed)
Per office policy sent 30 day only since  cpx appt has been schedule...Raechel Chute/lmb

## 2016-11-24 NOTE — Telephone Encounter (Signed)
Pt requesting refill of GLIPIZIDE XL 10 MG 24 hr tablet  [facility] [address]   CPE set up for 12/15/16

## 2016-12-15 ENCOUNTER — Ambulatory Visit (INDEPENDENT_AMBULATORY_CARE_PROVIDER_SITE_OTHER): Payer: BLUE CROSS/BLUE SHIELD | Admitting: Internal Medicine

## 2016-12-15 ENCOUNTER — Encounter: Payer: Self-pay | Admitting: Internal Medicine

## 2016-12-15 ENCOUNTER — Other Ambulatory Visit (INDEPENDENT_AMBULATORY_CARE_PROVIDER_SITE_OTHER): Payer: BLUE CROSS/BLUE SHIELD

## 2016-12-15 VITALS — BP 138/90 | HR 68 | Temp 97.7°F | Ht 69.5 in | Wt 241.0 lb

## 2016-12-15 DIAGNOSIS — I10 Essential (primary) hypertension: Secondary | ICD-10-CM | POA: Diagnosis not present

## 2016-12-15 DIAGNOSIS — E781 Pure hyperglyceridemia: Secondary | ICD-10-CM | POA: Diagnosis not present

## 2016-12-15 DIAGNOSIS — N400 Enlarged prostate without lower urinary tract symptoms: Secondary | ICD-10-CM | POA: Diagnosis not present

## 2016-12-15 DIAGNOSIS — E118 Type 2 diabetes mellitus with unspecified complications: Secondary | ICD-10-CM

## 2016-12-15 DIAGNOSIS — Z0000 Encounter for general adult medical examination without abnormal findings: Secondary | ICD-10-CM

## 2016-12-15 DIAGNOSIS — E669 Obesity, unspecified: Secondary | ICD-10-CM

## 2016-12-15 DIAGNOSIS — Z1159 Encounter for screening for other viral diseases: Secondary | ICD-10-CM

## 2016-12-15 DIAGNOSIS — Z1211 Encounter for screening for malignant neoplasm of colon: Secondary | ICD-10-CM

## 2016-12-15 DIAGNOSIS — E785 Hyperlipidemia, unspecified: Secondary | ICD-10-CM | POA: Diagnosis not present

## 2016-12-15 DIAGNOSIS — E1342 Other specified diabetes mellitus with diabetic polyneuropathy: Secondary | ICD-10-CM

## 2016-12-15 DIAGNOSIS — L989 Disorder of the skin and subcutaneous tissue, unspecified: Secondary | ICD-10-CM | POA: Diagnosis not present

## 2016-12-15 DIAGNOSIS — E66811 Obesity, class 1: Secondary | ICD-10-CM

## 2016-12-15 LAB — URINALYSIS, ROUTINE W REFLEX MICROSCOPIC
Bilirubin Urine: NEGATIVE
Hgb urine dipstick: NEGATIVE
Ketones, ur: NEGATIVE
Leukocytes, UA: NEGATIVE
Nitrite: NEGATIVE
RBC / HPF: NONE SEEN (ref 0–?)
Specific Gravity, Urine: 1.025 (ref 1.000–1.030)
Total Protein, Urine: NEGATIVE
Urine Glucose: NEGATIVE
Urobilinogen, UA: 0.2 (ref 0.0–1.0)
WBC, UA: NONE SEEN (ref 0–?)
pH: 5.5 (ref 5.0–8.0)

## 2016-12-15 LAB — COMPREHENSIVE METABOLIC PANEL
ALT: 24 U/L (ref 0–53)
AST: 21 U/L (ref 0–37)
Albumin: 4.3 g/dL (ref 3.5–5.2)
Alkaline Phosphatase: 67 U/L (ref 39–117)
BUN: 17 mg/dL (ref 6–23)
CO2: 27 mEq/L (ref 19–32)
Calcium: 9.3 mg/dL (ref 8.4–10.5)
Chloride: 103 mEq/L (ref 96–112)
Creatinine, Ser: 0.97 mg/dL (ref 0.40–1.50)
GFR: 81.79 mL/min (ref >60.00)
Glucose, Bld: 157 mg/dL — ABNORMAL HIGH (ref 70–99)
Potassium: 4.2 mEq/L (ref 3.5–5.1)
Sodium: 137 mEq/L (ref 135–145)
Total Bilirubin: 0.6 mg/dL (ref 0.2–1.2)
Total Protein: 7 g/dL (ref 6.0–8.3)

## 2016-12-15 LAB — MICROALBUMIN / CREATININE URINE RATIO
Creatinine,U: 119.9 mg/dL
Microalb Creat Ratio: 1.3 mg/g (ref 0.0–30.0)
Microalb, Ur: 1.6 mg/dL (ref 0.0–1.9)

## 2016-12-15 LAB — CBC WITH DIFFERENTIAL/PLATELET
Basophils Absolute: 0 10*3/uL (ref 0.0–0.1)
Basophils Relative: 0.8 % (ref 0.0–3.0)
Eosinophils Absolute: 0.3 10*3/uL (ref 0.0–0.7)
Eosinophils Relative: 5.3 % — ABNORMAL HIGH (ref 0.0–5.0)
HCT: 43.1 % (ref 39.0–52.0)
Hemoglobin: 14.3 g/dL (ref 13.0–17.0)
Lymphocytes Relative: 22.6 % (ref 12.0–46.0)
Lymphs Abs: 1.4 10*3/uL (ref 0.7–4.0)
MCHC: 33.1 g/dL (ref 30.0–36.0)
MCV: 91.7 fl (ref 78.0–100.0)
Monocytes Absolute: 0.5 10*3/uL (ref 0.1–1.0)
Monocytes Relative: 8.5 % (ref 3.0–12.0)
Neutro Abs: 3.9 10*3/uL (ref 1.4–7.7)
Neutrophils Relative %: 62.8 % (ref 43.0–77.0)
Platelets: 210 10*3/uL (ref 150.0–400.0)
RBC: 4.7 Mil/uL (ref 4.22–5.81)
RDW: 13.8 % (ref 11.5–15.5)
WBC: 6.2 10*3/uL (ref 4.0–10.5)

## 2016-12-15 LAB — LIPID PANEL
Cholesterol: 99 mg/dL (ref 0–200)
HDL: 38.5 mg/dL — ABNORMAL LOW (ref >39.00)
LDL Cholesterol: 44 mg/dL (ref 0–99)
NonHDL: 60.56
Total CHOL/HDL Ratio: 3
Triglycerides: 85 mg/dL (ref 0.0–149.0)
VLDL: 17 mg/dL (ref 0.0–40.0)

## 2016-12-15 LAB — TSH: TSH: 3.5 u[IU]/mL (ref 0.35–4.50)

## 2016-12-15 LAB — HEMOGLOBIN A1C: Hgb A1c MFr Bld: 7.9 % — ABNORMAL HIGH (ref 4.6–6.5)

## 2016-12-15 LAB — PSA: PSA: 1.49 ng/mL (ref 0.10–4.00)

## 2016-12-15 MED ORDER — METFORMIN HCL 1000 MG PO TABS: 1000.0000 mg | ORAL_TABLET | Freq: Two times a day (BID) | ORAL | 1 refills | Status: DC

## 2016-12-15 MED ORDER — CANAGLIFLOZIN 100 MG PO TABS: 100.0000 mg | ORAL_TABLET | Freq: Every day | ORAL | 1 refills | Status: DC

## 2016-12-15 MED ORDER — GABAPENTIN 300 MG PO CAPS: 300.0000 mg | ORAL_CAPSULE | Freq: Three times a day (TID) | ORAL | 5 refills | Status: DC

## 2016-12-15 MED ORDER — GLIPIZIDE ER 10 MG PO TB24: 10.0000 mg | ORAL_TABLET | Freq: Every day | ORAL | 1 refills | Status: DC

## 2016-12-15 NOTE — Progress Notes (Signed)
Subjective:  Patient ID: [name], male    DOB: 01-10-1951  Age: 66 y.o. MRN: [id number]  CC: Annual Exam; Hypertension; Diabetes; and Hyperlipidemia   HPI [place] Fearn presents for a CPX.  He is concerned about lesions on his right forearm and wants to be referred to dermatologist. He also wants to increase the dose of gabapentin. He says the 100 mg dose is helping some but he still complains of painful, electrical current sensations in both feet. He has also had some concerns recently that his blood sugar has not been well controlled.  Past Medical History:  Diagnosis Date  . Diabetes mellitus without complication (HCC)   . Hypertension    No past surgical history on file.  reports that he has been smoking Cigarettes.  He has never used smokeless tobacco. He reports that he does not drink alcohol or use drugs. family history includes Kidney disease in his brother. No Known Allergies  Outpatient Medications Prior to Visit  Medication Sig Dispense Refill  . aspirin EC 81 MG tablet Take 1 tablet (81 mg total) by mouth daily. 90 tablet 3  . atorvastatin (LIPITOR) 20 MG tablet Take 1 tablet (20 mg total) by mouth daily. 90 tablet 1  . lisinopril (PRINIVIL,ZESTRIL) 10 MG tablet Take 1 tablet (10 mg total) by mouth daily. 90 tablet 1  . gabapentin (NEURONTIN) 100 MG capsule Take 1 capsule (100 mg total) by mouth 3 (three) times daily. 90 capsule 11  . glipiZIDE (GLIPIZIDE XL) 10 MG 24 hr tablet Take 1 tablet (10 mg total) by mouth daily with breakfast. Must keep 12/15/16 appt for future refills 30 tablet 0  . metFORMIN (GLUCOPHAGE) 1000 MG tablet Take 1 tablet (1,000 mg total) by mouth 2 (two) times daily with a meal. 180 tablet 1  . methylPREDNISolone (MEDROL DOSEPAK) 4 MG TBPK tablet TAKE AS DIRECTED 21 tablet 0  . promethazine-dextromethorphan (PROMETHAZINE-DM) 6.25-15 MG/5ML syrup Take 5 mLs by mouth 4 (four) times daily as needed for cough. 118 mL 0   No facility-administered  medications prior to visit.     ROS Review of Systems  Constitutional: Negative.  Negative for appetite change, diaphoresis, fatigue and unexpected weight change.  HENT: Negative.   Eyes: Negative for visual disturbance.  Respiratory: Negative for cough, chest tightness, shortness of breath and wheezing.   Cardiovascular: Negative for chest pain, palpitations and leg swelling.  Gastrointestinal: Negative for abdominal pain, constipation, diarrhea, nausea and vomiting.  Endocrine: Positive for polydipsia and polyphagia. Negative for polyuria.  Genitourinary: Negative.  Negative for difficulty urinating, discharge, dysuria, penile pain, penile swelling, scrotal swelling, testicular pain and urgency.  Musculoskeletal: Negative.  Negative for arthralgias, back pain, myalgias and neck pain.  Skin: Negative.  Negative for color change.  Allergic/Immunologic: Negative.   Neurological: Negative.   Hematological: Negative for adenopathy. Does not bruise/bleed easily.  Psychiatric/Behavioral: Negative.     Objective:  BP 138/90 (BP Location: Left Arm, Patient Position: Sitting, Cuff Size: Normal)   Pulse 68   Temp 97.7 F (36.5 C) (Oral)   Ht 5' 9.5" (1.765 m)   Wt 241 lb (109.3 kg)   SpO2 98%   BMI 35.08 kg/m   BP Readings from Last 3 Encounters:  12/15/16 138/90  06/23/16 110/70  05/25/16 104/70    Wt Readings from Last 3 Encounters:  12/15/16 241 lb (109.3 kg)  06/23/16 234 lb (106.1 kg)  05/25/16 238 lb 4 oz (108.1 kg)    Physical Exam  Constitutional: He  is oriented to person, place, and time. No distress.  HENT:  Mouth/Throat: Oropharynx is clear and moist. No oropharyngeal exudate.  Eyes: Conjunctivae are normal. Right eye exhibits no discharge. Left eye exhibits no discharge. No scleral icterus.  Neck: Normal range of motion. Neck supple. No JVD present. No thyromegaly present.  Cardiovascular: Normal rate, regular rhythm and intact distal pulses.  Exam reveals no  gallop and no friction rub.   No murmur heard. EKG- Sinus  Rhythm  WITHIN NORMAL LIMITS- no change from prior EKG   Pulmonary/Chest: Effort normal and breath sounds normal. No respiratory distress. He has no wheezes. He has no rales. He exhibits no tenderness.  Abdominal: Soft. Bowel sounds are normal. He exhibits no distension and no mass. There is no tenderness. There is no rebound and no guarding. Hernia confirmed negative in the right inguinal area and confirmed negative in the left inguinal area.  Genitourinary: Rectum normal, testes normal and penis normal. Rectal exam shows no external hemorrhoid, no internal hemorrhoid, no fissure, no mass, no tenderness, anal tone normal and guaiac negative stool. Prostate is enlarged (1+ smooth symm BPH). Prostate is not tender. Right testis shows no mass, no swelling and no tenderness. Right testis is descended. Left testis shows no mass, no swelling and no tenderness. Left testis is descended. Uncircumcised. No phimosis, paraphimosis, hypospadias, penile erythema or penile tenderness. No discharge found.  Musculoskeletal: Normal range of motion. He exhibits no edema, tenderness or deformity.  Lymphadenopathy:    He has no cervical adenopathy.       Right: No inguinal adenopathy present.       Left: No inguinal adenopathy present.  Neurological: He is alert and oriented to person, place, and time.  Skin: Skin is warm and dry. No rash noted. He is not diaphoretic. No erythema. No pallor.  Dorsum of right forearm reveals 2 distinct lesions. They are fleshy colored, raised, and have a barnacle appearance.  Psychiatric: He has a normal mood and affect. His behavior is normal. Judgment and thought content normal.  Vitals reviewed.   Lab Results  Component Value Date   WBC 6.2 12/15/2016   HGB 14.3 12/15/2016   HCT 43.1 12/15/2016   PLT 210.0 12/15/2016   GLUCOSE 157 (H) 12/15/2016   CHOL 99 12/15/2016   TRIG 85.0 12/15/2016   HDL 38.50 (L)  12/15/2016   LDLDIRECT 84.0 10/10/2015   LDLCALC 44 12/15/2016   ALT 24 12/15/2016   AST 21 12/15/2016   NA 137 12/15/2016   K 4.2 12/15/2016   CL 103 12/15/2016   CREATININE 0.97 12/15/2016   BUN 17 12/15/2016   CO2 27 12/15/2016   TSH 3.50 12/15/2016   PSA 1.49 12/15/2016   HGBA1C 7.9 (H) 12/15/2016   MICROALBUR 1.6 12/15/2016    Ct Lumbar Spine W Contrast  Result Date: 11/14/2013 CLINICAL DATA:  Lower extremity weakness. Difficulty and extending at the ankle joints. EXAM: LUMBAR MYELOGRAM FLUOROSCOPY TIME:  1 min 45 seconds PROCEDURE: After thorough discussion of risks and benefits of the procedure including bleeding, infection, injury to nerves, blood vessels, adjacent structures as well as headache and CSF leak, written and oral informed consent was obtained. Consent was obtained by Dr. Gennette Pac. Time out form was completed. Patient was positioned prone on the fluoroscopy table. Local anesthesia was provided with 1% lidocaine without epinephrine after prepped and draped in the usual sterile fashion. Puncture was performed at L2-3 using a 3 1/2 inch 22-gauge spinal needle via right paramedian  approach. Using a single pass through the dura, the needle was placed within the thecal sac, with return of clear CSF. 15 mL of Omnipaque-180 was injected into the thecal sac, with normal opacification of the nerve roots and cauda equina consistent with free flow within the subarachnoid space. I personally performed the lumbar puncture and administered the intrathecal contrast. I also personally supervised acquisition of the myelogram images. TECHNIQUE: Contiguous axial images were obtained through the Lumbar spine after the intrathecal infusion of infusion. Coronal and sagittal reconstructions were obtained of the axial image sets. COMPARISON:  MRI lumbar spine 10/18/2012. FINDINGS: LUMBAR MYELOGRAM FINDINGS: Five non rib-bearing lumbar type vertebral bodies are present. Moderate central canal  stenosis is present at L3-4 with lateral recess narrowing bilaterally, right greater than left. There is mild leftward curvature of the lumbar spine at L4-5. Mild lateral recess narrowing is present at L4-5 is well with a broad-based disc protrusion. Mild disc bulging is present at L5-S1 without significant stenosis. Mild disc bulging is present at L2-3 with minimal left lateral recess narrowing. Endplate marrow changes are present on the right at L4-5. The flexion images demonstrate exacerbation of slight anterolisthesis at L3-4. This is reduced in extension. CT LUMBAR MYELOGRAM FINDINGS: The lumbar spine is imaged from T11-12 through S3. The conus medullaris terminates at L1, within normal limits. There is partial calcification of the L1-2 disc. A vacuum disc is present at L4-5 with chronic endplate sclerotic changes on the right. Focal leftward curvature is present in the lumbar spine at L4-5. Limited imaging of the abdomen demonstrates atherosclerotic calcifications in the aorta without aneurysm. L1-2: A mild broad-based disc bulge is present. There is no significant focal protrusion or stenosis. L2-3: A broad-based disc bulge is present. Asymmetric left-sided facet hypertrophy and ligamentum flavum thickening results in minimal left lateral recess narrowing. The foramina are patent. L3-4: Moderate facet degenerative changes are present. Ligamentum flavum thickening is noted. There is slight anterolisthesis with uncovering of a broad-based disc protrusion. This results in moderate lateral recess narrowing bilaterally. Moderate left and mild right foraminal stenosis is present. L4-5: A leftward disc protrusion is associated with the vocal scoliosis. Facet hypertrophy and ligamentum flavum thickening is present. Mild to moderate left lateral recess and bilateral foraminal stenosis is present. L5-S1: A shallow central disc protrusion is present without significant stenosis. IMPRESSION: LUMBAR MYELOGRAM IMPRESSION:  1. Moderate central canal stenosis at L3-4 with slight anterolisthesis exaggerated by flexion. 2. Mild lateral recess narrowing at L4-5 secondary to a broad-based disc protrusion 3. Mild left lateral recess narrowing at L2-3. CT LUMBAR MYELOGRAM IMPRESSION: 1. Minimal left lateral recess narrowing at L2-3 secondary to a broad-based disc bulge and left-sided and ligamentum flavum thickening. 2. Moderate lateral recess narrowing bilaterally with moderate left and mild right foraminal stenosis at L3-4. 3. Mild to moderate left lateral recess and bilateral foraminal stenosis at L4-5. 4. Shallow central disc protrusion without significant stenosis at L5-S1. Electronically Signed   By: Gennette Pac M.D.   On: 11/14/2013 14:11   Dg Myelography Lumbar Inj Lumbosacral  Result Date: 11/14/2013 CLINICAL DATA:  Lower extremity weakness. Difficulty and extending at the ankle joints. EXAM: LUMBAR MYELOGRAM FLUOROSCOPY TIME:  1 min 45 seconds PROCEDURE: After thorough discussion of risks and benefits of the procedure including bleeding, infection, injury to nerves, blood vessels, adjacent structures as well as headache and CSF leak, written and oral informed consent was obtained. Consent was obtained by Dr. Gennette Pac. Time out form was completed. Patient was positioned  prone on the fluoroscopy table. Local anesthesia was provided with 1% lidocaine without epinephrine after prepped and draped in the usual sterile fashion. Puncture was performed at L2-3 using a 3 1/2 inch 22-gauge spinal needle via right paramedian approach. Using a single pass through the dura, the needle was placed within the thecal sac, with return of clear CSF. 15 mL of Omnipaque-180 was injected into the thecal sac, with normal opacification of the nerve roots and cauda equina consistent with free flow within the subarachnoid space. I personally performed the lumbar puncture and administered the intrathecal contrast. I also personally supervised  acquisition of the myelogram images. TECHNIQUE: Contiguous axial images were obtained through the Lumbar spine after the intrathecal infusion of infusion. Coronal and sagittal reconstructions were obtained of the axial image sets. COMPARISON:  MRI lumbar spine 10/18/2012. FINDINGS: LUMBAR MYELOGRAM FINDINGS: Five non rib-bearing lumbar type vertebral bodies are present. Moderate central canal stenosis is present at L3-4 with lateral recess narrowing bilaterally, right greater than left. There is mild leftward curvature of the lumbar spine at L4-5. Mild lateral recess narrowing is present at L4-5 is well with a broad-based disc protrusion. Mild disc bulging is present at L5-S1 without significant stenosis. Mild disc bulging is present at L2-3 with minimal left lateral recess narrowing. Endplate marrow changes are present on the right at L4-5. The flexion images demonstrate exacerbation of slight anterolisthesis at L3-4. This is reduced in extension. CT LUMBAR MYELOGRAM FINDINGS: The lumbar spine is imaged from T11-12 through S3. The conus medullaris terminates at L1, within normal limits. There is partial calcification of the L1-2 disc. A vacuum disc is present at L4-5 with chronic endplate sclerotic changes on the right. Focal leftward curvature is present in the lumbar spine at L4-5. Limited imaging of the abdomen demonstrates atherosclerotic calcifications in the aorta without aneurysm. L1-2: A mild broad-based disc bulge is present. There is no significant focal protrusion or stenosis. L2-3: A broad-based disc bulge is present. Asymmetric left-sided facet hypertrophy and ligamentum flavum thickening results in minimal left lateral recess narrowing. The foramina are patent. L3-4: Moderate facet degenerative changes are present. Ligamentum flavum thickening is noted. There is slight anterolisthesis with uncovering of a broad-based disc protrusion. This results in moderate lateral recess narrowing bilaterally.  Moderate left and mild right foraminal stenosis is present. L4-5: A leftward disc protrusion is associated with the vocal scoliosis. Facet hypertrophy and ligamentum flavum thickening is present. Mild to moderate left lateral recess and bilateral foraminal stenosis is present. L5-S1: A shallow central disc protrusion is present without significant stenosis. IMPRESSION: LUMBAR MYELOGRAM IMPRESSION: 1. Moderate central canal stenosis at L3-4 with slight anterolisthesis exaggerated by flexion. 2. Mild lateral recess narrowing at L4-5 secondary to a broad-based disc protrusion 3. Mild left lateral recess narrowing at L2-3. CT LUMBAR MYELOGRAM IMPRESSION: 1. Minimal left lateral recess narrowing at L2-3 secondary to a broad-based disc bulge and left-sided and ligamentum flavum thickening. 2. Moderate lateral recess narrowing bilaterally with moderate left and mild right foraminal stenosis at L3-4. 3. Mild to moderate left lateral recess and bilateral foraminal stenosis at L4-5. 4. Shallow central disc protrusion without significant stenosis at L5-S1. Electronically Signed   By: Gennette Pachris  Mattern M.D.   On: 11/14/2013 14:11    Assessment & Plan:   Verdene LennertVermont was seen today for annual exam, hypertension, diabetes and hyperlipidemia.  Diagnoses and all orders for this visit:  Essential hypertension, benign- his blood pressure is adequately well controlled on his EKG is negative for ischemia  or LVH. His electrolytes and renal function are normal. We'll continue the ACE inhibitor. -     Comprehensive metabolic panel; Future -     CBC with Differential/Platelet; Future -     Urinalysis, Routine w reflex microscopic; Future -     EKG 12-Lead  Type 2 diabetes mellitus with complication, without long-term current use of insulin (HCC)- his A1c is 7.9%. His blood sugars are not adequately well controlled. Will add an SGLT2 inhibitor to metformin and the sulfonylurea. -     Comprehensive metabolic panel; Future -      Hemoglobin A1c; Future -     Microalbumin / creatinine urine ratio; Future -     Ambulatory referral to Ophthalmology -     glipiZIDE (GLIPIZIDE XL) 10 MG 24 hr tablet; Take 1 tablet (10 mg total) by mouth daily with breakfast. -     metFORMIN (GLUCOPHAGE) 1000 MG tablet; Take 1 tablet (1,000 mg total) by mouth 2 (two) times daily with a meal. -     canagliflozin (INVOKANA) 100 MG TABS tablet; Take 1 tablet (100 mg total) by mouth daily before breakfast.  Benign prostatic hyperplasia without lower urinary tract symptoms- his PSA is normal so I'm not concerned about prostate cancer. He has no symptoms that need to be treated. -     Urinalysis, Routine w reflex microscopic; Future -     PSA; Future  Colon cancer screening- Cologuard ordered to screen for colon cancer and polyps.  Hyperlipidemia with target LDL less than 70- he is achieved his LDL goal is doing well on the statin. -     Lipid panel; Future -     TSH; Future  Pure hyperglyceridemia- improvement noted -     Lipid panel; Future  Routine general medical examination at a health care facility  Diabetic polyneuropathy associated with other specified diabetes mellitus (HCC) -     gabapentin (NEURONTIN) 300 MG capsule; Take 1 capsule (300 mg total) by mouth 3 (three) times daily.  Encounter for hepatitis C screening test for low risk patient -     Hepatitis C antibody; Future  Arm skin lesion, right- the lesions appear to be actinic keratoses. He will see dermatology to consider options to remove these lesions. -     Ambulatory referral to Dermatology  Obesity (BMI 30.0-34.9)- he agrees to work on his lifestyle modifications to lose weight.   I have discontinued Mr. Suchocki's gabapentin, promethazine-dextromethorphan, and methylPREDNISolone. I have also changed his glipiZIDE. Additionally, I am having him start on gabapentin and canagliflozin. Lastly, I am having him maintain his aspirin EC, atorvastatin, lisinopril, and  metFORMIN.  Meds ordered this encounter  Medications  . gabapentin (NEURONTIN) 300 MG capsule    Sig: Take 1 capsule (300 mg total) by mouth 3 (three) times daily.    Dispense:  90 capsule    Refill:  5  . glipiZIDE (GLIPIZIDE XL) 10 MG 24 hr tablet    Sig: Take 1 tablet (10 mg total) by mouth daily with breakfast.    Dispense:  90 tablet    Refill:  1    Please consider 90 day supplies to promote better adherence  . metFORMIN (GLUCOPHAGE) 1000 MG tablet    Sig: Take 1 tablet (1,000 mg total) by mouth 2 (two) times daily with a meal.    Dispense:  180 tablet    Refill:  1  . canagliflozin (INVOKANA) 100 MG TABS tablet    Sig: Take 1 tablet (  100 mg total) by mouth daily before breakfast.    Dispense:  90 tablet    Refill:  1   See AVS for instructions about healthy living and anticipatory guidance.  Follow-up: Return in about 6 months (around 06/17/2017).  Sanda Linger, MD

## 2016-12-15 NOTE — Patient Instructions (Signed)

## 2016-12-16 LAB — HEPATITIS C ANTIBODY: HCV Ab: NONREACTIVE

## 2016-12-17 DIAGNOSIS — E669 Obesity, unspecified: Secondary | ICD-10-CM | POA: Insufficient documentation

## 2016-12-17 NOTE — Assessment & Plan Note (Signed)

## 2016-12-22 ENCOUNTER — Telehealth: Payer: Self-pay

## 2016-12-22 NOTE — Telephone Encounter (Signed)
Order [id number]

## 2017-04-22 ENCOUNTER — Other Ambulatory Visit: Payer: Self-pay | Admitting: Internal Medicine

## 2017-04-22 DIAGNOSIS — E118 Type 2 diabetes mellitus with unspecified complications: Secondary | ICD-10-CM

## 2017-04-22 DIAGNOSIS — E785 Hyperlipidemia, unspecified: Secondary | ICD-10-CM

## 2017-04-23 ENCOUNTER — Other Ambulatory Visit: Payer: Self-pay | Admitting: Internal Medicine

## 2017-04-23 DIAGNOSIS — E118 Type 2 diabetes mellitus with unspecified complications: Secondary | ICD-10-CM

## 2017-04-29 ENCOUNTER — Other Ambulatory Visit: Payer: Self-pay | Admitting: Internal Medicine

## 2017-04-29 DIAGNOSIS — I10 Essential (primary) hypertension: Secondary | ICD-10-CM

## 2017-04-29 DIAGNOSIS — E118 Type 2 diabetes mellitus with unspecified complications: Secondary | ICD-10-CM

## 2017-04-29 NOTE — Telephone Encounter (Signed)
Pt is due for follow up in February. Can call and schedule please.

## 2017-04-30 NOTE — Telephone Encounter (Signed)
Pt needs a follow up in February. Can you contact.

## 2017-05-03 NOTE — Telephone Encounter (Signed)
Erx has been sent. 

## 2017-05-03 NOTE — Telephone Encounter (Signed)
Patient is scheduled to come in on 06/15/2017.

## 2017-05-07 ENCOUNTER — Other Ambulatory Visit: Payer: Self-pay | Admitting: Internal Medicine

## 2017-05-07 ENCOUNTER — Telehealth: Payer: Self-pay | Admitting: Internal Medicine

## 2017-05-07 NOTE — Telephone Encounter (Signed)
Copied from CRM [id number]. Topic: Quick Communication - See Telephone Encounter >> May 07, 2017 10:38 AM Jolayne Hainesaylor, Brittany L wrote: CRM for notification. See Telephone encounter for:   05/07/17.  Promethazine for coughing .Marland Kitchen. Would like another refill,he states he is coughing really bad  [facility] [address] [place] , [place] - [address]

## 2017-05-07 NOTE — Telephone Encounter (Signed)
Pt contacted and informed that an office visit would be needed before an rx is sent in.

## 2017-05-19 NOTE — Telephone Encounter (Signed)
Scheduled

## 2017-06-02 NOTE — Telephone Encounter (Signed)
Please close encounter

## 2017-06-15 ENCOUNTER — Ambulatory Visit: Payer: BLUE CROSS/BLUE SHIELD | Admitting: Internal Medicine

## 2017-06-18 NOTE — Telephone Encounter (Signed)
Order cancelled

## 2017-07-10 ENCOUNTER — Other Ambulatory Visit: Payer: Self-pay | Admitting: Internal Medicine

## 2017-07-10 DIAGNOSIS — E118 Type 2 diabetes mellitus with unspecified complications: Secondary | ICD-10-CM

## 2017-07-10 DIAGNOSIS — I10 Essential (primary) hypertension: Secondary | ICD-10-CM

## 2017-07-12 ENCOUNTER — Other Ambulatory Visit: Payer: Self-pay | Admitting: Internal Medicine

## 2017-07-12 DIAGNOSIS — E118 Type 2 diabetes mellitus with unspecified complications: Secondary | ICD-10-CM

## 2017-07-12 DIAGNOSIS — I10 Essential (primary) hypertension: Secondary | ICD-10-CM

## 2017-07-12 NOTE — Telephone Encounter (Signed)
Copied from CRM [id number]. Topic: Quick Communication - Rx Refill/Question >> Jul 12, 2017 10:57 AM Louie BunPalacios Medina, Rosey Batheresa D wrote: Medication: lisinopril (PRINIVIL,ZESTRIL) 10 MG tablet, metFORMIN (GLUCOPHAGE) 1000 MG tablet   Has the patient contacted their pharmacy?Yes patient has appt next week.   (Agent: If no, request that the patient contact the pharmacy for the refill.)   Preferred Pharmacy (with phone number or street name): [facility] [address] [place]- [place], [place] - [address]   Agent: Please be advised that RX refills may take up to 3 business days. We ask that you follow-up with your pharmacy.

## 2017-07-12 NOTE — Telephone Encounter (Signed)
Pt requesting med refills for  Lisinopril and metformin. Last request was refused on 0309/19.   Last refill on Lisinopril was 04/2717 and last refill on metformin was 04/23/17. Last  HA1C was 12/15/17 and it was 7.9  LOV was 12/15/16 NOV is 07/20/17  Provider: Sanda Lingerhomas Jones, MD

## 2017-07-13 ENCOUNTER — Ambulatory Visit: Payer: BLUE CROSS/BLUE SHIELD | Admitting: Internal Medicine

## 2017-07-13 MED ORDER — LISINOPRIL 10 MG PO TABS: 10.0000 mg | ORAL_TABLET | Freq: Every day | ORAL | 0 refills | Status: DC

## 2017-07-13 MED ORDER — METFORMIN HCL 1000 MG PO TABS: 1000.0000 mg | ORAL_TABLET | Freq: Two times a day (BID) | ORAL | 0 refills | Status: DC

## 2017-07-13 NOTE — Telephone Encounter (Signed)
Per office policy sent 30 day to local pharmacy until appt.../lmb  

## 2017-07-18 ENCOUNTER — Other Ambulatory Visit: Payer: Self-pay | Admitting: Internal Medicine

## 2017-07-18 DIAGNOSIS — E785 Hyperlipidemia, unspecified: Secondary | ICD-10-CM

## 2017-07-18 DIAGNOSIS — E118 Type 2 diabetes mellitus with unspecified complications: Secondary | ICD-10-CM

## 2017-07-20 ENCOUNTER — Encounter: Payer: Self-pay | Admitting: Internal Medicine

## 2017-07-20 ENCOUNTER — Ambulatory Visit: Payer: BLUE CROSS/BLUE SHIELD | Admitting: Internal Medicine

## 2017-07-20 ENCOUNTER — Telehealth: Payer: Self-pay | Admitting: Internal Medicine

## 2017-07-20 VITALS — BP 120/70 | HR 80 | Temp 98.0°F | Resp 16 | Ht 69.0 in | Wt 240.1 lb

## 2017-07-20 DIAGNOSIS — Z23 Encounter for immunization: Secondary | ICD-10-CM | POA: Diagnosis not present

## 2017-07-20 DIAGNOSIS — I10 Essential (primary) hypertension: Secondary | ICD-10-CM

## 2017-07-20 DIAGNOSIS — E118 Type 2 diabetes mellitus with unspecified complications: Secondary | ICD-10-CM | POA: Diagnosis not present

## 2017-07-20 DIAGNOSIS — Z794 Long term (current) use of insulin: Secondary | ICD-10-CM | POA: Diagnosis not present

## 2017-07-20 DIAGNOSIS — E1140 Type 2 diabetes mellitus with diabetic neuropathy, unspecified: Secondary | ICD-10-CM

## 2017-07-20 LAB — POCT GLUCOSE (DEVICE FOR HOME USE): POC Glucose: 205 mg/dl — AB (ref 70–99)

## 2017-07-20 LAB — POCT GLYCOSYLATED HEMOGLOBIN (HGB A1C): Hemoglobin A1C: 6.7

## 2017-07-20 MED ORDER — GLIPIZIDE ER 10 MG PO TB24: 10.0000 mg | ORAL_TABLET | Freq: Every day | ORAL | 1 refills | Status: DC

## 2017-07-20 MED ORDER — PREGABALIN 75 MG PO CAPS: 75.0000 mg | ORAL_CAPSULE | Freq: Two times a day (BID) | ORAL | 5 refills | Status: DC

## 2017-07-20 NOTE — Patient Instructions (Signed)
Diabetic Neuropathy Diabetic neuropathy is a nerve disease or nerve damage that is caused by diabetes mellitus. About half of all people with diabetes mellitus have some form of nerve damage. Nerve damage is more common in those who have had diabetes mellitus for many years and who generally have not had good control of their blood sugar (glucose) level. Diabetic neuropathy is a common complication of diabetes mellitus. There are three common types of diabetic neuropathy and a fourth type that is less common and less understood:  Peripheral neuropathy-This is the most common type of diabetic neuropathy. It causes damage to the nerves of the feet and legs first and then eventually the hands and arms. The damage affects the ability to sense touch.  Autonomic neuropathy-This type causes damage to the autonomic nervous system, which controls the following functions: ? Heartbeat. ? Body temperature. ? Blood pressure. ? Urination. ? Digestion. ? Sweating. ? Sexual function.  Focal neuropathy-Focal neuropathy can be painful and unpredictable and occurs most often in older adults with diabetes mellitus. It involves a specific nerve or one area and often comes on suddenly. It usually does not cause long-term problems.  Radiculoplexus neuropathy- Sometimes called lumbosacral radiculoplexus neuropathy, radiculoplexus neuropathy affects the nerves of the thighs, hips, buttocks, or legs. It is more common in people with type 2 diabetes mellitus and in older men. It is characterized by debilitating pain, weakness, and atrophy, usually in the thigh muscles.  What are the causes? The cause of peripheral, autonomic, and focal neuropathies is diabetes mellitus that is uncontrolled and high glucose levels. The cause of radiculoplexus neuropathy is unknown. However, it is thought to be caused by inflammation related to uncontrolled glucose levels. What are the signs or symptoms? Peripheral Neuropathy Peripheral  neuropathy develops slowly over time. When the nerves of the feet and legs no longer work there may be:  Burning, stabbing, or aching pain in the legs or feet.  Inability to feel pressure or pain in your feet. This can lead to: ? Thick calluses over pressure areas. ? Pressure sores. ? Ulcers.  Foot deformities.  Reduced ability to feel temperature changes.  Muscle weakness.  Autonomic Neuropathy The symptoms of autonomic neuropathy vary depending on which nerves are affected. Symptoms may include:  Problems with digestion, such as: ? Feeling sick to your stomach (nausea). ? Vomiting. ? Bloating. ? Constipation. ? Diarrhea. ? Abdominal pain.  Difficulty with urination. This occurs if you lose your ability to sense when your bladder is full. Problems include: ? Urine leakage (incontinence). ? Inability to empty your bladder completely (retention).  Rapid or irregular heartbeat (palpitations).  Blood pressure drops when you stand up (orthostatic hypotension). When you stand up you may feel: ? Dizzy. ? Weak. ? Faint.  In men, inability to attain and maintain an erection.  In women, vaginal dryness and problems with decreased sexual desire and arousal.  Problems with body temperature regulation.  Increased or decreased sweating.  Focal Neuropathy  Abnormal eye movements or abnormal alignment of both eyes.  Weakness in the wrist.  Foot drop. This results in an inability to lift the foot properly and abnormal walking or foot movement.  Paralysis on one side of your face (Bell palsy).  Chest or abdominal pain. Radiculoplexus Neuropathy  Sudden, severe pain in your hip, thigh, or buttocks.  Weakness and wasting of thigh muscles.  Difficulty rising from a seated position.  Abdominal swelling.  Unexplained weight loss (usually more than 10 lb [4.5 kg]). How is   this diagnosed? Peripheral Neuropathy Your senses may be tested. Sensory function testing can be  done with:  A light touch using a monofilament.  A vibration with tuning fork.  A sharp sensation with a pin prick.  Other tests that can help diagnose neuropathy are:  Nerve conduction velocity. This test checks the transmission of an electrical current through a nerve.  Electromyography. This shows how muscles respond to electrical signals transmitted by nearby nerves.  Quantitative sensory testing. This is used to assess how your nerves respond to vibrations and changes in temperature.  Autonomic Neuropathy Diagnosis is often based on reported symptoms. Tell your health care provider if you experience:  Dizziness.  Constipation.  Diarrhea.  Inappropriate urination or inability to urinate.  Inability to get or maintain an erection.  Tests that may be done include:  Electrocardiography or Holter monitor. These are tests that can help show problems with the heart rate or heart rhythm.  An X-ray exam may be done.  Focal Neuropathy Diagnosis is made based on your symptoms and what your health care provider finds during your exam. Other tests may be done. They may include:  Nerve conduction velocities. This checks the transmission of electrical current through a nerve.  Electromyography. This shows how muscles respond to electrical signals transmitted by nearby nerves.  Quantitative sensory testing. This test is used to assess how your nerves respond to vibration and changes in temperature.  Radiculoplexus Neuropathy  Often the first thing is to eliminate any other issue or problems that might be the cause, as there is no standard test for diagnosis.  X-ray exam of your spine and lumbar region.  Spinal tap to rule out cancer.  MRI to rule out other lesions. How is this treated? Once nerve damage occurs, it cannot be reversed. The goal of treatment is to keep the disease or nerve damage from getting worse and affecting more nerve fibers. Controlling your blood  glucose level is the key. Most people with radiculoplexus neuropathy see at least a partial improvement over time. You will need to keep your blood glucose and HbA1c levels in the target range determined by your health care provider. Things that help control blood glucose levels include:  Blood glucose monitoring.  Meal planning.  Physical activity.  Diabetes medicine.  Over time, maintaining lower blood glucose levels helps lessen symptoms. Sometimes, prescription pain medicine is needed. Follow these instructions at home:  Do not smoke.  Keep your blood glucose level in the range that you and your health care provider have determined acceptable for you.  Keep your blood pressure level in the range that you and your health care provider have determined acceptable for you.  Eat a well-balanced diet.  Be physically active every day. Include strength training and balance exercises.  Protect your feet. ? Check your feet every day for sores, cuts, blisters, or signs of infection. ? Wear padded socks and supportive shoes. Use orthotic inserts, if necessary. ? Regularly check the insides of your shoes for worn spots. Make sure there are no rocks or other items inside your shoes before you put them on. Contact a health care provider if:  You have burning, stabbing, or aching pain in the legs or feet.  You are unable to feel pressure or pain in your feet.  You develop problems with digestion such as: ? Nausea. ? Vomiting. ? Bloating. ? Constipation. ? Diarrhea. ? Abdominal pain.  You have difficulty with urination, such as: ? Incontinence. ? Retention.    You have palpitations.  You develop orthostatic hypotension. When you stand up you may feel: ? Dizzy. ? Weak. ? Faint.  You cannot attain and maintain an erection (in men).  You have vaginal dryness and problems with decreased sexual desire and arousal (in women).  You have severe pain in your thighs, legs, or  buttocks.  You have unexplained weight loss. This information is not intended to replace advice given to you by your health care provider. Make sure you discuss any questions you have with your health care provider. Document Released: 06/29/2001 Document Revised: 09/26/2015 Document Reviewed: 09/29/2012 Elsevier Interactive Patient Education  2017 [employer]  

## 2017-07-20 NOTE — Telephone Encounter (Signed)
Reviewed chart pt is up-to-date sent refills to [facility].../[facility]  

## 2017-07-20 NOTE — Progress Notes (Signed)
Subjective:  Patient ID: Michael Ashley, male    DOB: 05-Sep-1950  Age: 67 y.o. MRN: [id number]  CC: Diabetes and Hypertension   HPI Michael Ashley presents for f/up - He tells me his blood pressure and blood sugar have been well controlled.  He is not taking the SGLT-2 inhibitor because he says it was too expensive.  He denies any recent episodes of polys.  He denies chest pain, shortness of breath, palpitations, edema, or fatigue.  He complains of diabetic nerve pain in his feet.  There is a discomfort that occurs at rest and he also has numbness and tingling.  He has not gotten much symptom relief with gabapentin.  Outpatient Medications Prior to Visit  Medication Sig Dispense Refill  . aspirin EC 81 MG tablet Take 1 tablet (81 mg total) by mouth daily. 90 tablet 3  . atorvastatin (LIPITOR) 20 MG tablet TAKE 1 TABLET BY MOUTH ONCE DAILY 90 tablet 0  . lisinopril (PRINIVIL,ZESTRIL) 10 MG tablet Take 1 tablet (10 mg total) by mouth daily. Must keep schedule appt for future refills 30 tablet 0  . metFORMIN (GLUCOPHAGE) 1000 MG tablet Take 1 tablet (1,000 mg total) by mouth 2 (two) times daily with a meal. Must keep schedule appt for future refills 60 tablet 0  . gabapentin (NEURONTIN) 300 MG capsule Take 1 capsule (300 mg total) by mouth 3 (three) times daily. 90 capsule 5  . glipiZIDE (GLIPIZIDE XL) 10 MG 24 hr tablet Take 1 tablet (10 mg total) by mouth daily with breakfast. 90 tablet 1  . canagliflozin (INVOKANA) 100 MG TABS tablet Take 1 tablet (100 mg total) by mouth daily before breakfast. 90 tablet 1   No facility-administered medications prior to visit.     ROS Review of Systems  Constitutional: Negative.  Negative for diaphoresis and fatigue.  HENT: Negative.   Eyes: Negative for visual disturbance.  Respiratory: Negative for cough, chest tightness, shortness of breath and wheezing.   Cardiovascular: Negative for chest pain, palpitations and leg swelling.  Gastrointestinal: Negative.   Negative for abdominal pain, diarrhea, nausea and vomiting.  Endocrine: Negative for polydipsia, polyphagia and polyuria.  Genitourinary: Negative.  Negative for difficulty urinating.  Musculoskeletal: Negative.  Negative for arthralgias, back pain, myalgias and neck pain.  Skin: Negative.  Negative for color change and pallor.  Neurological: Negative.  Negative for dizziness, weakness and light-headedness.  Hematological: Negative for adenopathy. Does not bruise/bleed easily.  Psychiatric/Behavioral: Negative.     Objective:  BP 120/70 (BP Location: Left Arm, Patient Position: Sitting, Cuff Size: Normal)   Pulse 80   Temp 98 F (36.7 C) (Oral)   Resp 16   Ht 5\' 9"  (1.753 m)   Wt 240 lb 1.3 oz (108.9 kg)   SpO2 93%   BMI 35.45 kg/m   BP Readings from Last 3 Encounters:  07/20/17 120/70  12/15/16 138/90  06/23/16 110/70    Wt Readings from Last 3 Encounters:  07/20/17 240 lb 1.3 oz (108.9 kg)  12/15/16 241 lb (109.3 kg)  06/23/16 234 lb (106.1 kg)    Physical Exam  Constitutional: He is oriented to person, place, and time. No distress.  HENT:  Mouth/Throat: Oropharynx is clear and moist. No oropharyngeal exudate.  Eyes: Conjunctivae are normal. Left eye exhibits no discharge. No scleral icterus.  Neck: Normal range of motion. Neck supple. No JVD present. No thyromegaly present.  Cardiovascular: Normal rate, regular rhythm and normal heart sounds. Exam reveals no gallop and no friction  rub.  No murmur heard. Pulmonary/Chest: Effort normal and breath sounds normal. No respiratory distress. He has no wheezes. He has no rales.  Abdominal: Soft. Bowel sounds are normal. He exhibits no distension and no mass. There is no tenderness. There is no guarding.  Musculoskeletal: Normal range of motion. He exhibits no edema, tenderness or deformity.  Lymphadenopathy:    He has no cervical adenopathy.  Neurological: He is alert and oriented to person, place, and time.  Skin: Skin is  warm and dry. No rash noted. He is not diaphoretic. No erythema. No pallor.  Vitals reviewed.   Lab Results  Component Value Date   WBC 6.2 12/15/2016   HGB 14.3 12/15/2016   HCT 43.1 12/15/2016   PLT 210.0 12/15/2016   GLUCOSE 157 (H) 12/15/2016   CHOL 99 12/15/2016   TRIG 85.0 12/15/2016   HDL 38.50 (L) 12/15/2016   LDLDIRECT 84.0 10/10/2015   LDLCALC 44 12/15/2016   ALT 24 12/15/2016   AST 21 12/15/2016   NA 137 12/15/2016   K 4.2 12/15/2016   CL 103 12/15/2016   CREATININE 0.97 12/15/2016   BUN 17 12/15/2016   CO2 27 12/15/2016   TSH 3.50 12/15/2016   PSA 1.49 12/15/2016   HGBA1C 6.7 07/20/2017   MICROALBUR 1.6 12/15/2016    Ct Lumbar Spine W Contrast  Result Date: 11/14/2013 CLINICAL DATA:  Lower extremity weakness. Difficulty and extending at the ankle joints. EXAM: LUMBAR MYELOGRAM FLUOROSCOPY TIME:  1 min 45 seconds PROCEDURE: After thorough discussion of risks and benefits of the procedure including bleeding, infection, injury to nerves, blood vessels, adjacent structures as well as headache and CSF leak, written and oral informed consent was obtained. Consent was obtained by Dr. Gennette Pac. Time out form was completed. Patient was positioned prone on the fluoroscopy table. Local anesthesia was provided with 1% lidocaine without epinephrine after prepped and draped in the usual sterile fashion. Puncture was performed at L2-3 using a 3 1/2 inch 22-gauge spinal needle via right paramedian approach. Using a single pass through the dura, the needle was placed within the thecal sac, with return of clear CSF. 15 mL of Omnipaque-180 was injected into the thecal sac, with normal opacification of the nerve roots and cauda equina consistent with free flow within the subarachnoid space. I personally performed the lumbar puncture and administered the intrathecal contrast. I also personally supervised acquisition of the myelogram images. TECHNIQUE: Contiguous axial images were obtained  through the Lumbar spine after the intrathecal infusion of infusion. Coronal and sagittal reconstructions were obtained of the axial image sets. COMPARISON:  MRI lumbar spine 10/18/2012. FINDINGS: LUMBAR MYELOGRAM FINDINGS: Five non rib-bearing lumbar type vertebral bodies are present. Moderate central canal stenosis is present at L3-4 with lateral recess narrowing bilaterally, right greater than left. There is mild leftward curvature of the lumbar spine at L4-5. Mild lateral recess narrowing is present at L4-5 is well with a broad-based disc protrusion. Mild disc bulging is present at L5-S1 without significant stenosis. Mild disc bulging is present at L2-3 with minimal left lateral recess narrowing. Endplate marrow changes are present on the right at L4-5. The flexion images demonstrate exacerbation of slight anterolisthesis at L3-4. This is reduced in extension. CT LUMBAR MYELOGRAM FINDINGS: The lumbar spine is imaged from T11-12 through S3. The conus medullaris terminates at L1, within normal limits. There is partial calcification of the L1-2 disc. A vacuum disc is present at L4-5 with chronic endplate sclerotic changes on the right. Focal leftward  curvature is present in the lumbar spine at L4-5. Limited imaging of the abdomen demonstrates atherosclerotic calcifications in the aorta without aneurysm. L1-2: A mild broad-based disc bulge is present. There is no significant focal protrusion or stenosis. L2-3: A broad-based disc bulge is present. Asymmetric left-sided facet hypertrophy and ligamentum flavum thickening results in minimal left lateral recess narrowing. The foramina are patent. L3-4: Moderate facet degenerative changes are present. Ligamentum flavum thickening is noted. There is slight anterolisthesis with uncovering of a broad-based disc protrusion. This results in moderate lateral recess narrowing bilaterally. Moderate left and mild right foraminal stenosis is present. L4-5: A leftward disc  protrusion is associated with the vocal scoliosis. Facet hypertrophy and ligamentum flavum thickening is present. Mild to moderate left lateral recess and bilateral foraminal stenosis is present. L5-S1: A shallow central disc protrusion is present without significant stenosis. IMPRESSION: LUMBAR MYELOGRAM IMPRESSION: 1. Moderate central canal stenosis at L3-4 with slight anterolisthesis exaggerated by flexion. 2. Mild lateral recess narrowing at L4-5 secondary to a broad-based disc protrusion 3. Mild left lateral recess narrowing at L2-3. CT LUMBAR MYELOGRAM IMPRESSION: 1. Minimal left lateral recess narrowing at L2-3 secondary to a broad-based disc bulge and left-sided and ligamentum flavum thickening. 2. Moderate lateral recess narrowing bilaterally with moderate left and mild right foraminal stenosis at L3-4. 3. Mild to moderate left lateral recess and bilateral foraminal stenosis at L4-5. 4. Shallow central disc protrusion without significant stenosis at L5-S1. Electronically Signed   By: Gennette Pac M.D.   On: 11/14/2013 14:11   Dg Myelography Lumbar Inj Lumbosacral  Result Date: 11/14/2013 CLINICAL DATA:  Lower extremity weakness. Difficulty and extending at the ankle joints. EXAM: LUMBAR MYELOGRAM FLUOROSCOPY TIME:  1 min 45 seconds PROCEDURE: After thorough discussion of risks and benefits of the procedure including bleeding, infection, injury to nerves, blood vessels, adjacent structures as well as headache and CSF leak, written and oral informed consent was obtained. Consent was obtained by Dr. Gennette Pac. Time out form was completed. Patient was positioned prone on the fluoroscopy table. Local anesthesia was provided with 1% lidocaine without epinephrine after prepped and draped in the usual sterile fashion. Puncture was performed at L2-3 using a 3 1/2 inch 22-gauge spinal needle via right paramedian approach. Using a single pass through the dura, the needle was placed within the thecal sac,  with return of clear CSF. 15 mL of Omnipaque-180 was injected into the thecal sac, with normal opacification of the nerve roots and cauda equina consistent with free flow within the subarachnoid space. I personally performed the lumbar puncture and administered the intrathecal contrast. I also personally supervised acquisition of the myelogram images. TECHNIQUE: Contiguous axial images were obtained through the Lumbar spine after the intrathecal infusion of infusion. Coronal and sagittal reconstructions were obtained of the axial image sets. COMPARISON:  MRI lumbar spine 10/18/2012. FINDINGS: LUMBAR MYELOGRAM FINDINGS: Five non rib-bearing lumbar type vertebral bodies are present. Moderate central canal stenosis is present at L3-4 with lateral recess narrowing bilaterally, right greater than left. There is mild leftward curvature of the lumbar spine at L4-5. Mild lateral recess narrowing is present at L4-5 is well with a broad-based disc protrusion. Mild disc bulging is present at L5-S1 without significant stenosis. Mild disc bulging is present at L2-3 with minimal left lateral recess narrowing. Endplate marrow changes are present on the right at L4-5. The flexion images demonstrate exacerbation of slight anterolisthesis at L3-4. This is reduced in extension. CT LUMBAR MYELOGRAM FINDINGS: The lumbar spine is  imaged from T11-12 through S3. The conus medullaris terminates at L1, within normal limits. There is partial calcification of the L1-2 disc. A vacuum disc is present at L4-5 with chronic endplate sclerotic changes on the right. Focal leftward curvature is present in the lumbar spine at L4-5. Limited imaging of the abdomen demonstrates atherosclerotic calcifications in the aorta without aneurysm. L1-2: A mild broad-based disc bulge is present. There is no significant focal protrusion or stenosis. L2-3: A broad-based disc bulge is present. Asymmetric left-sided facet hypertrophy and ligamentum flavum thickening  results in minimal left lateral recess narrowing. The foramina are patent. L3-4: Moderate facet degenerative changes are present. Ligamentum flavum thickening is noted. There is slight anterolisthesis with uncovering of a broad-based disc protrusion. This results in moderate lateral recess narrowing bilaterally. Moderate left and mild right foraminal stenosis is present. L4-5: A leftward disc protrusion is associated with the vocal scoliosis. Facet hypertrophy and ligamentum flavum thickening is present. Mild to moderate left lateral recess and bilateral foraminal stenosis is present. L5-S1: A shallow central disc protrusion is present without significant stenosis. IMPRESSION: LUMBAR MYELOGRAM IMPRESSION: 1. Moderate central canal stenosis at L3-4 with slight anterolisthesis exaggerated by flexion. 2. Mild lateral recess narrowing at L4-5 secondary to a broad-based disc protrusion 3. Mild left lateral recess narrowing at L2-3. CT LUMBAR MYELOGRAM IMPRESSION: 1. Minimal left lateral recess narrowing at L2-3 secondary to a broad-based disc bulge and left-sided and ligamentum flavum thickening. 2. Moderate lateral recess narrowing bilaterally with moderate left and mild right foraminal stenosis at L3-4. 3. Mild to moderate left lateral recess and bilateral foraminal stenosis at L4-5. 4. Shallow central disc protrusion without significant stenosis at L5-S1. Electronically Signed   By: Gennette Pac M.D.   On: 11/14/2013 14:11    Assessment & Plan:   Amyr was seen today for diabetes and hypertension.  Diagnoses and all orders for this visit:  Essential hypertension, benign- His blood pressure is well controlled.  I will monitor his electrolytes and renal function. -     Basic metabolic panel; Future  Type 2 diabetes mellitus with complication, with long-term current use of insulin (HCC)- His A1c is down to 6.7%.  His blood sugars are adequately well controlled.  Will continue metformin and the  sulfonylurea. -     Cancel: [facility] Diabetes Eye Exam -     POCT glycosylated hemoglobin (Hb A1C) -     POCT Glucose (Device for Home Use) -     Basic metabolic panel; Future -     Ambulatory referral to Ophthalmology  Need for influenza vaccination -     Flu vaccine HIGH DOSE PF (Fluzone High dose)  Need for pneumococcal vaccine -     Pneumococcal conjugate vaccine 13-valent  Painful diabetic neuropathy (HCC)- He has not gotten much symptom relief with gabapentin so will upgrade to Lyrica. -     pregabalin (LYRICA) 75 MG capsule; Take 1 capsule (75 mg total) by mouth 2 (two) times daily.   I have discontinued Michael Ashley's gabapentin and canagliflozin. I am also having him start on pregabalin. Additionally, I am having him maintain his aspirin EC, lisinopril, metFORMIN, and atorvastatin.  Meds ordered this encounter  Medications  . pregabalin (LYRICA) 75 MG capsule    Sig: Take 1 capsule (75 mg total) by mouth 2 (two) times daily.    Dispense:  90 capsule    Refill:  5     Follow-up: Return in about 6 months (around 01/20/2018).  Sanda Linger,  MD

## 2017-07-20 NOTE — Telephone Encounter (Signed)
Copied from CRM [phone]. Topic: Quick Communication - See Telephone Encounter >> Jul 20, 2017  9:57 AM Jolayne Hainesaylor, Brittany L wrote: CRM for notification. See Telephone encounter for:   07/20/17.  Patient said he just left the office and Dr Yetta BarreJones was suppose to send over a script for glipiZIDE (GLIPIZIDE XL) 10 MG 24 hr tablet and the pharmacy does not have it. Please Re-Send.   [facility] [address], [place] - [id number]

## 2017-08-05 ENCOUNTER — Other Ambulatory Visit: Payer: Self-pay | Admitting: Internal Medicine

## 2017-08-05 DIAGNOSIS — E118 Type 2 diabetes mellitus with unspecified complications: Secondary | ICD-10-CM

## 2017-08-05 DIAGNOSIS — I10 Essential (primary) hypertension: Secondary | ICD-10-CM

## 2017-08-05 MED ORDER — LISINOPRIL 10 MG PO TABS: 10.0000 mg | ORAL_TABLET | Freq: Every day | ORAL | 1 refills | Status: DC

## 2017-09-01 ENCOUNTER — Telehealth: Payer: Self-pay | Admitting: Internal Medicine

## 2017-09-01 DIAGNOSIS — E118 Type 2 diabetes mellitus with unspecified complications: Secondary | ICD-10-CM

## 2017-09-01 MED ORDER — METFORMIN HCL 1000 MG PO TABS: 1000.0000 mg | ORAL_TABLET | Freq: Two times a day (BID) | ORAL | 0 refills | Status: DC

## 2017-09-01 NOTE — Telephone Encounter (Signed)
Copied from CRM [id number]. Topic: Quick Communication - Rx Refill/Question >> Sep 01, 2017 12:06 PM Mcneil, Ja-Kwan wrote: Medication: metFORMIN (GLUCOPHAGE) 1000 MG tablet Has the patient contacted their pharmacy? Yes   Preferred Pharmacy : [facility] Agent: Please be advised that RX refills may take up to 3 business days. We ask that you follow-up with your pharmacy.

## 2017-09-27 ENCOUNTER — Other Ambulatory Visit: Payer: Self-pay | Admitting: Internal Medicine

## 2017-09-27 DIAGNOSIS — E1342 Other specified diabetes mellitus with diabetic polyneuropathy: Secondary | ICD-10-CM

## 2017-11-08 ENCOUNTER — Other Ambulatory Visit: Payer: Self-pay | Admitting: Internal Medicine

## 2017-11-08 DIAGNOSIS — E118 Type 2 diabetes mellitus with unspecified complications: Secondary | ICD-10-CM

## 2017-11-08 DIAGNOSIS — E785 Hyperlipidemia, unspecified: Secondary | ICD-10-CM

## 2017-11-10 ENCOUNTER — Other Ambulatory Visit: Payer: Self-pay | Admitting: Internal Medicine

## 2017-11-10 DIAGNOSIS — E785 Hyperlipidemia, unspecified: Secondary | ICD-10-CM

## 2017-11-10 DIAGNOSIS — E118 Type 2 diabetes mellitus with unspecified complications: Secondary | ICD-10-CM

## 2017-12-28 NOTE — Telephone Encounter (Signed)
This is the second time Cologuard order expired.

## 2018-02-11 ENCOUNTER — Telehealth: Payer: Self-pay | Admitting: Internal Medicine

## 2018-02-11 DIAGNOSIS — E118 Type 2 diabetes mellitus with unspecified complications: Secondary | ICD-10-CM

## 2018-02-11 NOTE — Telephone Encounter (Signed)
Can we get pt in for a follow up?

## 2018-02-11 NOTE — Telephone Encounter (Signed)
No answer, no voicemail set up.

## 2018-02-11 NOTE — Telephone Encounter (Signed)
Appt sched for 03/08/18 9:30am, pt requesting refill until appt time.  Please advise.

## 2018-02-11 NOTE — Telephone Encounter (Signed)
erx has been sent in.  °

## 2018-02-21 ENCOUNTER — Other Ambulatory Visit: Payer: Self-pay | Admitting: Internal Medicine

## 2018-02-21 DIAGNOSIS — E118 Type 2 diabetes mellitus with unspecified complications: Secondary | ICD-10-CM

## 2018-02-21 DIAGNOSIS — I10 Essential (primary) hypertension: Secondary | ICD-10-CM

## 2018-03-08 ENCOUNTER — Ambulatory Visit (INDEPENDENT_AMBULATORY_CARE_PROVIDER_SITE_OTHER)
Admission: RE | Admit: 2018-03-08 | Discharge: 2018-03-08 | Disposition: A | Discharge status: Home / Self Care | Payer: BLUE CROSS/BLUE SHIELD | Source: Ambulatory Visit | Attending: Internal Medicine | Admitting: Internal Medicine

## 2018-03-08 ENCOUNTER — Encounter: Payer: Self-pay | Admitting: Internal Medicine

## 2018-03-08 ENCOUNTER — Other Ambulatory Visit (INDEPENDENT_AMBULATORY_CARE_PROVIDER_SITE_OTHER): Payer: BLUE CROSS/BLUE SHIELD

## 2018-03-08 ENCOUNTER — Ambulatory Visit: Payer: BLUE CROSS/BLUE SHIELD | Admitting: Internal Medicine

## 2018-03-08 VITALS — BP 124/76 | HR 85 | Temp 98.3°F | Resp 16 | Ht 69.0 in | Wt 219.5 lb

## 2018-03-08 DIAGNOSIS — Z23 Encounter for immunization: Secondary | ICD-10-CM

## 2018-03-08 DIAGNOSIS — N400 Enlarged prostate without lower urinary tract symptoms: Secondary | ICD-10-CM | POA: Diagnosis not present

## 2018-03-08 DIAGNOSIS — Z794 Long term (current) use of insulin: Secondary | ICD-10-CM | POA: Diagnosis not present

## 2018-03-08 DIAGNOSIS — I10 Essential (primary) hypertension: Secondary | ICD-10-CM | POA: Diagnosis not present

## 2018-03-08 DIAGNOSIS — R059 Cough, unspecified: Secondary | ICD-10-CM

## 2018-03-08 DIAGNOSIS — R05 Cough: Secondary | ICD-10-CM

## 2018-03-08 DIAGNOSIS — Z0000 Encounter for general adult medical examination without abnormal findings: Secondary | ICD-10-CM

## 2018-03-08 DIAGNOSIS — E118 Type 2 diabetes mellitus with unspecified complications: Secondary | ICD-10-CM

## 2018-03-08 DIAGNOSIS — E785 Hyperlipidemia, unspecified: Secondary | ICD-10-CM

## 2018-03-08 LAB — CBC WITH DIFFERENTIAL/PLATELET
Basophils Absolute: 0 10*3/uL (ref 0.0–0.1)
Basophils Relative: 0.5 % (ref 0.0–3.0)
Eosinophils Absolute: 0.2 10*3/uL (ref 0.0–0.7)
Eosinophils Relative: 3.4 % (ref 0.0–5.0)
HCT: 43.5 % (ref 39.0–52.0)
Hemoglobin: 14.7 g/dL (ref 13.0–17.0)
Lymphocytes Relative: 14.8 % (ref 12.0–46.0)
Lymphs Abs: 1 10*3/uL (ref 0.7–4.0)
MCHC: 33.8 g/dL (ref 30.0–36.0)
MCV: 91.5 fl (ref 78.0–100.0)
Monocytes Absolute: 0.4 10*3/uL (ref 0.1–1.0)
Monocytes Relative: 6.1 % (ref 3.0–12.0)
Neutro Abs: 5.2 10*3/uL (ref 1.4–7.7)
Neutrophils Relative %: 75.2 % (ref 43.0–77.0)
Platelets: 245 10*3/uL (ref 150.0–400.0)
RBC: 4.76 Mil/uL (ref 4.22–5.81)
RDW: 14.3 % (ref 11.5–15.5)
WBC: 7 10*3/uL (ref 4.0–10.5)

## 2018-03-08 LAB — COMPREHENSIVE METABOLIC PANEL
ALT: 23 U/L (ref 0–53)
AST: 22 U/L (ref 0–37)
Albumin: 4.6 g/dL (ref 3.5–5.2)
Alkaline Phosphatase: 66 U/L (ref 39–117)
BUN: 18 mg/dL (ref 6–23)
CO2: 29 mEq/L (ref 19–32)
Calcium: 9.5 mg/dL (ref 8.4–10.5)
Chloride: 101 mEq/L (ref 96–112)
Creatinine, Ser: 1.14 mg/dL (ref 0.40–1.50)
GFR: 67.64 mL/min (ref >60.00)
Glucose, Bld: 127 mg/dL — ABNORMAL HIGH (ref 70–99)
Potassium: 4.6 mEq/L (ref 3.5–5.1)
Sodium: 139 mEq/L (ref 135–145)
Total Bilirubin: 0.7 mg/dL (ref 0.2–1.2)
Total Protein: 7.6 g/dL (ref 6.0–8.3)

## 2018-03-08 LAB — LIPID PANEL
Cholesterol: 99 mg/dL (ref 0–200)
HDL: 39.5 mg/dL (ref >39.00)
LDL Cholesterol: 42 mg/dL (ref 0–99)
NonHDL: 59.86
Total CHOL/HDL Ratio: 3
Triglycerides: 90 mg/dL (ref 0.0–149.0)
VLDL: 18 mg/dL (ref 0.0–40.0)

## 2018-03-08 LAB — URINALYSIS, ROUTINE W REFLEX MICROSCOPIC
Bilirubin Urine: NEGATIVE
Hgb urine dipstick: NEGATIVE
Ketones, ur: NEGATIVE
Leukocytes, UA: NEGATIVE
Nitrite: NEGATIVE
RBC / HPF: NONE SEEN (ref 0–?)
Specific Gravity, Urine: >=1.03 — AB (ref 1.000–1.030)
Urine Glucose: 250 — AB
Urobilinogen, UA: 0.2 (ref 0.0–1.0)
pH: 5.5 (ref 5.0–8.0)

## 2018-03-08 LAB — MICROALBUMIN / CREATININE URINE RATIO
Creatinine,U: 208.7 mg/dL
Microalb Creat Ratio: 1.8 mg/g (ref 0.0–30.0)
Microalb, Ur: 3.7 mg/dL — ABNORMAL HIGH (ref 0.0–1.9)

## 2018-03-08 LAB — BASIC METABOLIC PANEL
BUN: 18 mg/dL (ref 6–23)
CO2: 29 mEq/L (ref 19–32)
Calcium: 9.5 mg/dL (ref 8.4–10.5)
Chloride: 101 mEq/L (ref 96–112)
Creatinine, Ser: 1.14 mg/dL (ref 0.40–1.50)
GFR: 67.64 mL/min (ref >60.00)
Glucose, Bld: 127 mg/dL — ABNORMAL HIGH (ref 70–99)
Potassium: 4.6 mEq/L (ref 3.5–5.1)
Sodium: 139 mEq/L (ref 135–145)

## 2018-03-08 LAB — HEMOGLOBIN A1C: Hgb A1c MFr Bld: 6.2 % (ref 4.6–6.5)

## 2018-03-08 LAB — TSH: TSH: 1.74 u[IU]/mL (ref 0.35–4.50)

## 2018-03-08 LAB — PSA: PSA: 1.27 ng/mL (ref 0.10–4.00)

## 2018-03-08 MED ORDER — AZILSARTAN MEDOXOMIL 40 MG PO TABS: 1.0000 | ORAL_TABLET | Freq: Every day | ORAL | 1 refills | Status: DC

## 2018-03-08 NOTE — Progress Notes (Signed)
Subjective:  Patient ID: Michael Ashley, male    DOB: [birth year]  Age: 67 y.o. MRN: [id number]  CC: Annual Exam; Hypertension; Hyperlipidemia; Diabetes; and Cough   HPI [place] Klammer presents for a CPX.  He complains of chronic, intermittent, nonproductive cough.  He also thinks he has lost weight.  He denies chest pain, hemoptysis, wheezing, CP, DOE, palpitations, edema, or fatigue.  He tells me his blood sugars have been well controlled and he denies polys.  Past Medical History:  Diagnosis Date  . Diabetes mellitus without complication (HCC)   . Hypertension    History reviewed. No pertinent surgical history.  reports that he has been smoking cigarettes. He has never used smokeless tobacco. He reports that he does not drink alcohol or use drugs. family history includes Kidney disease in his brother. Allergies  Allergen Reactions  . Lisinopril Cough    Outpatient Medications Prior to Visit  Medication Sig Dispense Refill  . aspirin EC 81 MG tablet Take 1 tablet (81 mg total) by mouth daily. 90 tablet 3  . atorvastatin (LIPITOR) 20 MG tablet TAKE 1 TABLET BY MOUTH ONCE DAILY 90 tablet 0  . gabapentin (NEURONTIN) 300 MG capsule TAKE 1 CAPSULE BY MOUTH THREE TIMES DAILY 90 capsule 5  . metFORMIN (GLUCOPHAGE) 1000 MG tablet Take 1 tablet (1,000 mg total) by mouth 2 (two) times daily with a meal. Must keep schedule appt for future refills 60 tablet 0  . glipiZIDE (GLUCOTROL XL) 10 MG 24 hr tablet Take 1 tablet (10 mg total) by mouth daily with breakfast. 90 tablet 1  . lisinopril (PRINIVIL,ZESTRIL) 10 MG tablet Take 1 tablet (10 mg total) by mouth daily. Must keep schedule appt for future refills 90 tablet 1  . atorvastatin (LIPITOR) 20 MG tablet TAKE 1 TABLET BY MOUTH ONCE DAILY 90 tablet 0  . pregabalin (LYRICA) 75 MG capsule Take 1 capsule (75 mg total) by mouth 2 (two) times daily. 90 capsule 5   No facility-administered medications prior to visit.     ROS Review of Systems    Constitutional: Positive for unexpected weight change. Negative for activity change, appetite change, chills, diaphoresis, fatigue and fever.  HENT: Negative.  Negative for sore throat and trouble swallowing.   Eyes: Negative.   Respiratory: Positive for cough. Negative for shortness of breath.   Cardiovascular: Negative for chest pain, palpitations and leg swelling.  Gastrointestinal: Negative for abdominal pain, constipation, diarrhea, nausea and vomiting.  Endocrine: Negative.   Genitourinary: Negative.  Negative for difficulty urinating, dysuria, scrotal swelling and testicular pain.  Musculoskeletal: Negative.  Negative for arthralgias and myalgias.  Skin: Negative.  Negative for color change.  Neurological: Negative.  Negative for dizziness, speech difficulty and light-headedness.  Hematological: Negative for adenopathy. Does not bruise/bleed easily.  Psychiatric/Behavioral: Negative.     Objective:  BP 124/76 (BP Location: Left Arm, Patient Position: Sitting, Cuff Size: Normal)   Pulse 85   Temp 98.3 F (36.8 C) (Oral)   Resp 16   Ht 5\' 9"  (1.753 m)   Wt 219 lb 8 oz (99.6 kg)   SpO2 97%   BMI 32.41 kg/m   BP Readings from Last 3 Encounters:  03/08/18 124/76  07/20/17 120/70  12/15/16 138/90    Wt Readings from Last 3 Encounters:  03/08/18 219 lb 8 oz (99.6 kg)  07/20/17 240 lb 1.3 oz (108.9 kg)  12/15/16 241 lb (109.3 kg)    Physical Exam  Constitutional: He is oriented to person, place, and  time. No distress.  HENT:  Mouth/Throat: Oropharynx is clear and moist. No oropharyngeal exudate.  Eyes: Conjunctivae are normal. No scleral icterus.  Neck: Normal range of motion. Neck supple. No JVD present. No thyromegaly present.  Cardiovascular: Normal rate, regular rhythm and normal heart sounds. Exam reveals no gallop.  No murmur heard. Pulmonary/Chest: Effort normal and breath sounds normal. No respiratory distress. He has no wheezes. He has no rhonchi. He has no  rales.  Abdominal: Soft. Bowel sounds are normal. He exhibits no mass. There is no hepatosplenomegaly. There is no tenderness. Hernia confirmed negative in the right inguinal area and confirmed negative in the left inguinal area.  Genitourinary: Testes normal and penis normal. Rectal exam shows no external hemorrhoid, no internal hemorrhoid, no fissure, no mass, no tenderness, anal tone normal and guaiac negative stool. Prostate is enlarged (1+ smooth symm BPH). Prostate is not tender. Right testis shows no mass, no swelling and no tenderness. Left testis shows no mass, no swelling and no tenderness. Uncircumcised. No phimosis, paraphimosis, hypospadias, penile erythema or penile tenderness. No discharge found.  Musculoskeletal: Normal range of motion. He exhibits no edema or deformity.  Lymphadenopathy:    He has no cervical adenopathy. No inguinal adenopathy noted on the right or left side.  Neurological: He is alert and oriented to person, place, and time.  Skin: Skin is warm and dry. No rash noted. He is not diaphoretic.  Vitals reviewed.   Lab Results  Component Value Date   WBC 7.0 03/08/2018   HGB 14.7 03/08/2018   HCT 43.5 03/08/2018   PLT 245.0 03/08/2018   GLUCOSE 127 (H) 03/08/2018   GLUCOSE 127 (H) 03/08/2018   CHOL 99 03/08/2018   TRIG 90.0 03/08/2018   HDL 39.50 03/08/2018   LDLDIRECT 84.0 10/10/2015   LDLCALC 42 03/08/2018   ALT 23 03/08/2018   AST 22 03/08/2018   NA 139 03/08/2018   NA 139 03/08/2018   K 4.6 03/08/2018   K 4.6 03/08/2018   CL 101 03/08/2018   CL 101 03/08/2018   CREATININE 1.14 03/08/2018   CREATININE 1.14 03/08/2018   BUN 18 03/08/2018   BUN 18 03/08/2018   CO2 29 03/08/2018   CO2 29 03/08/2018   TSH 1.74 03/08/2018   PSA 1.27 03/08/2018   HGBA1C 6.2 03/08/2018   MICROALBUR 3.7 (H) 03/08/2018    Ct Lumbar Spine W Contrast  Result Date: 11/14/2013 CLINICAL DATA:  Lower extremity weakness. Difficulty and extending at the ankle joints.  EXAM: LUMBAR MYELOGRAM FLUOROSCOPY TIME:  1 min 45 seconds PROCEDURE: After thorough discussion of risks and benefits of the procedure including bleeding, infection, injury to nerves, blood vessels, adjacent structures as well as headache and CSF leak, written and oral informed consent was obtained. Consent was obtained by Dr. Gennette Pac. Time out form was completed. Patient was positioned prone on the fluoroscopy table. Local anesthesia was provided with 1% lidocaine without epinephrine after prepped and draped in the usual sterile fashion. Puncture was performed at L2-3 using a 3 1/2 inch 22-gauge spinal needle via right paramedian approach. Using a single pass through the dura, the needle was placed within the thecal sac, with return of clear CSF. 15 mL of Omnipaque-180 was injected into the thecal sac, with normal opacification of the nerve roots and cauda equina consistent with free flow within the subarachnoid space. I personally performed the lumbar puncture and administered the intrathecal contrast. I also personally supervised acquisition of the myelogram images. TECHNIQUE: Contiguous axial  images were obtained through the Lumbar spine after the intrathecal infusion of infusion. Coronal and sagittal reconstructions were obtained of the axial image sets. COMPARISON:  MRI lumbar spine 10/18/2012. FINDINGS: LUMBAR MYELOGRAM FINDINGS: Five non rib-bearing lumbar type vertebral bodies are present. Moderate central canal stenosis is present at L3-4 with lateral recess narrowing bilaterally, right greater than left. There is mild leftward curvature of the lumbar spine at L4-5. Mild lateral recess narrowing is present at L4-5 is well with a broad-based disc protrusion. Mild disc bulging is present at L5-S1 without significant stenosis. Mild disc bulging is present at L2-3 with minimal left lateral recess narrowing. Endplate marrow changes are present on the right at L4-5. The flexion images demonstrate  exacerbation of slight anterolisthesis at L3-4. This is reduced in extension. CT LUMBAR MYELOGRAM FINDINGS: The lumbar spine is imaged from T11-12 through S3. The conus medullaris terminates at L1, within normal limits. There is partial calcification of the L1-2 disc. A vacuum disc is present at L4-5 with chronic endplate sclerotic changes on the right. Focal leftward curvature is present in the lumbar spine at L4-5. Limited imaging of the abdomen demonstrates atherosclerotic calcifications in the aorta without aneurysm. L1-2: A mild broad-based disc bulge is present. There is no significant focal protrusion or stenosis. L2-3: A broad-based disc bulge is present. Asymmetric left-sided facet hypertrophy and ligamentum flavum thickening results in minimal left lateral recess narrowing. The foramina are patent. L3-4: Moderate facet degenerative changes are present. Ligamentum flavum thickening is noted. There is slight anterolisthesis with uncovering of a broad-based disc protrusion. This results in moderate lateral recess narrowing bilaterally. Moderate left and mild right foraminal stenosis is present. L4-5: A leftward disc protrusion is associated with the vocal scoliosis. Facet hypertrophy and ligamentum flavum thickening is present. Mild to moderate left lateral recess and bilateral foraminal stenosis is present. L5-S1: A shallow central disc protrusion is present without significant stenosis. IMPRESSION: LUMBAR MYELOGRAM IMPRESSION: 1. Moderate central canal stenosis at L3-4 with slight anterolisthesis exaggerated by flexion. 2. Mild lateral recess narrowing at L4-5 secondary to a broad-based disc protrusion 3. Mild left lateral recess narrowing at L2-3. CT LUMBAR MYELOGRAM IMPRESSION: 1. Minimal left lateral recess narrowing at L2-3 secondary to a broad-based disc bulge and left-sided and ligamentum flavum thickening. 2. Moderate lateral recess narrowing bilaterally with moderate left and mild right foraminal  stenosis at L3-4. 3. Mild to moderate left lateral recess and bilateral foraminal stenosis at L4-5. 4. Shallow central disc protrusion without significant stenosis at L5-S1. Electronically Signed   By: Gennette Pac M.D.   On: 11/14/2013 14:11   Dg Myelography Lumbar Inj Lumbosacral  Result Date: 11/14/2013 CLINICAL DATA:  Lower extremity weakness. Difficulty and extending at the ankle joints. EXAM: LUMBAR MYELOGRAM FLUOROSCOPY TIME:  1 min 45 seconds PROCEDURE: After thorough discussion of risks and benefits of the procedure including bleeding, infection, injury to nerves, blood vessels, adjacent structures as well as headache and CSF leak, written and oral informed consent was obtained. Consent was obtained by Dr. Gennette Pac. Time out form was completed. Patient was positioned prone on the fluoroscopy table. Local anesthesia was provided with 1% lidocaine without epinephrine after prepped and draped in the usual sterile fashion. Puncture was performed at L2-3 using a 3 1/2 inch 22-gauge spinal needle via right paramedian approach. Using a single pass through the dura, the needle was placed within the thecal sac, with return of clear CSF. 15 mL of Omnipaque-180 was injected into the thecal sac, with normal  opacification of the nerve roots and cauda equina consistent with free flow within the subarachnoid space. I personally performed the lumbar puncture and administered the intrathecal contrast. I also personally supervised acquisition of the myelogram images. TECHNIQUE: Contiguous axial images were obtained through the Lumbar spine after the intrathecal infusion of infusion. Coronal and sagittal reconstructions were obtained of the axial image sets. COMPARISON:  MRI lumbar spine 10/18/2012. FINDINGS: LUMBAR MYELOGRAM FINDINGS: Five non rib-bearing lumbar type vertebral bodies are present. Moderate central canal stenosis is present at L3-4 with lateral recess narrowing bilaterally, right greater than left.  There is mild leftward curvature of the lumbar spine at L4-5. Mild lateral recess narrowing is present at L4-5 is well with a broad-based disc protrusion. Mild disc bulging is present at L5-S1 without significant stenosis. Mild disc bulging is present at L2-3 with minimal left lateral recess narrowing. Endplate marrow changes are present on the right at L4-5. The flexion images demonstrate exacerbation of slight anterolisthesis at L3-4. This is reduced in extension. CT LUMBAR MYELOGRAM FINDINGS: The lumbar spine is imaged from T11-12 through S3. The conus medullaris terminates at L1, within normal limits. There is partial calcification of the L1-2 disc. A vacuum disc is present at L4-5 with chronic endplate sclerotic changes on the right. Focal leftward curvature is present in the lumbar spine at L4-5. Limited imaging of the abdomen demonstrates atherosclerotic calcifications in the aorta without aneurysm. L1-2: A mild broad-based disc bulge is present. There is no significant focal protrusion or stenosis. L2-3: A broad-based disc bulge is present. Asymmetric left-sided facet hypertrophy and ligamentum flavum thickening results in minimal left lateral recess narrowing. The foramina are patent. L3-4: Moderate facet degenerative changes are present. Ligamentum flavum thickening is noted. There is slight anterolisthesis with uncovering of a broad-based disc protrusion. This results in moderate lateral recess narrowing bilaterally. Moderate left and mild right foraminal stenosis is present. L4-5: A leftward disc protrusion is associated with the vocal scoliosis. Facet hypertrophy and ligamentum flavum thickening is present. Mild to moderate left lateral recess and bilateral foraminal stenosis is present. L5-S1: A shallow central disc protrusion is present without significant stenosis. IMPRESSION: LUMBAR MYELOGRAM IMPRESSION: 1. Moderate central canal stenosis at L3-4 with slight anterolisthesis exaggerated by flexion.  2. Mild lateral recess narrowing at L4-5 secondary to a broad-based disc protrusion 3. Mild left lateral recess narrowing at L2-3. CT LUMBAR MYELOGRAM IMPRESSION: 1. Minimal left lateral recess narrowing at L2-3 secondary to a broad-based disc bulge and left-sided and ligamentum flavum thickening. 2. Moderate lateral recess narrowing bilaterally with moderate left and mild right foraminal stenosis at L3-4. 3. Mild to moderate left lateral recess and bilateral foraminal stenosis at L4-5. 4. Shallow central disc protrusion without significant stenosis at L5-S1. Electronically Signed   By: Gennette Pac M.D.   On: 11/14/2013 14:11    Assessment & Plan:   Trevion was seen today for annual exam, hypertension, hyperlipidemia, diabetes and cough.  Diagnoses and all orders for this visit:  Essential hypertension, benign-his blood pressure is well controlled but he has developed a cough on the ACE inhibitor.  I have asked him to transition to an ARB. -     CBC with Differential/Platelet; Future -     Comprehensive metabolic panel; Future -     Urinalysis, Routine w reflex microscopic; Future -     Azilsartan Medoxomil (EDARBI) 40 MG TABS; Take 1 tablet by mouth daily.  Type II diabetes mellitus with manifestations (HCC)- His A1c is down to 6.2%.  I have asked him to stop taking the sulfonylurea to avoid hypoglycemia.  I have asked him to continue metformin at the current dose.  Will start an ARB for renal protection. -     Comprehensive metabolic panel; Future -     Hemoglobin A1c; Future -     Microalbumin / creatinine urine ratio; Future -     Ambulatory referral to Ophthalmology -     HM Diabetes Foot Exam  Hyperlipidemia with target LDL less than 70-he has achieved his LDL goal and is doing well on the statin. -     Lipid panel; Future -     Comprehensive metabolic panel; Future -     TSH; Future  Benign prostatic hyperplasia without lower urinary tract symptoms-his PSA is normal which is  reassuring that he does not have prostate cancer.  He has no symptoms that need to be treated. -     PSA; Future  Routine general medical examination at a health care facility  Need for influenza vaccination -     Flu vaccine HIGH DOSE PF (Fluzone High dose)  Cough- He has a nonproductive cough but no other symptoms.  His chest x-ray is negative for mass or infiltrate.  I think this is caused by the ACE inhibitor so I have asked him to stop taking it. -     DG Chest 2 View; Future   I have discontinued Almando Delapena's pregabalin, lisinopril, and glipiZIDE. I am also having him start on Azilsartan Medoxomil. Additionally, I am having him maintain his aspirin EC, metFORMIN, gabapentin, and atorvastatin.  Meds ordered this encounter  Medications  . Azilsartan Medoxomil (EDARBI) 40 MG TABS    Sig: Take 1 tablet by mouth daily.    Dispense:  90 tablet    Refill:  1   See AVS for instructions about healthy living and anticipatory guidance.  Follow-up: Return in about 6 months (around 09/06/2018).  Sanda Linger, MD

## 2018-03-08 NOTE — Patient Instructions (Signed)

## 2018-03-08 NOTE — Assessment & Plan Note (Signed)

## 2018-03-14 DIAGNOSIS — M5416 Radiculopathy, lumbar region: Secondary | ICD-10-CM | POA: Diagnosis not present

## 2018-03-14 DIAGNOSIS — M5136 Other intervertebral disc degeneration, lumbar region: Secondary | ICD-10-CM | POA: Diagnosis not present

## 2018-03-14 DIAGNOSIS — M438X6 Other specified deforming dorsopathies, lumbar region: Secondary | ICD-10-CM | POA: Diagnosis not present

## 2018-03-14 DIAGNOSIS — M545 Low back pain: Secondary | ICD-10-CM | POA: Diagnosis not present

## 2018-03-24 DIAGNOSIS — M545 Low back pain: Secondary | ICD-10-CM | POA: Diagnosis not present

## 2018-03-26 ENCOUNTER — Other Ambulatory Visit: Payer: Self-pay | Admitting: Internal Medicine

## 2018-03-26 DIAGNOSIS — E118 Type 2 diabetes mellitus with unspecified complications: Secondary | ICD-10-CM

## 2018-03-29 DIAGNOSIS — M438X6 Other specified deforming dorsopathies, lumbar region: Secondary | ICD-10-CM | POA: Diagnosis not present

## 2018-03-29 DIAGNOSIS — M5416 Radiculopathy, lumbar region: Secondary | ICD-10-CM | POA: Diagnosis not present

## 2018-03-29 DIAGNOSIS — M5136 Other intervertebral disc degeneration, lumbar region: Secondary | ICD-10-CM | POA: Diagnosis not present

## 2018-05-09 ENCOUNTER — Other Ambulatory Visit: Payer: Self-pay | Admitting: Internal Medicine

## 2018-05-09 DIAGNOSIS — E785 Hyperlipidemia, unspecified: Secondary | ICD-10-CM

## 2018-05-09 DIAGNOSIS — E118 Type 2 diabetes mellitus with unspecified complications: Secondary | ICD-10-CM

## 2018-05-30 ENCOUNTER — Ambulatory Visit: Payer: BLUE CROSS/BLUE SHIELD | Admitting: Neurology

## 2018-05-30 ENCOUNTER — Telehealth: Payer: Self-pay | Admitting: Neurology

## 2018-05-30 NOTE — Telephone Encounter (Signed)
Pt was a no show for the New patient apt scheduled this morning.

## 2018-05-31 ENCOUNTER — Encounter: Payer: Self-pay | Admitting: Neurology

## 2018-06-07 ENCOUNTER — Ambulatory Visit: Payer: BLUE CROSS/BLUE SHIELD | Admitting: Neurology

## 2018-06-07 ENCOUNTER — Encounter: Payer: Self-pay | Admitting: Neurology

## 2018-06-07 VITALS — BP 106/65 | HR 72 | Ht 69.5 in | Wt 224.0 lb

## 2018-06-07 DIAGNOSIS — G1221 Amyotrophic lateral sclerosis: Secondary | ICD-10-CM

## 2018-06-07 DIAGNOSIS — M216X2 Other acquired deformities of left foot: Secondary | ICD-10-CM

## 2018-06-07 DIAGNOSIS — G3281 Cerebellar ataxia in diseases classified elsewhere: Secondary | ICD-10-CM

## 2018-06-07 DIAGNOSIS — R269 Unspecified abnormalities of gait and mobility: Secondary | ICD-10-CM

## 2018-06-07 DIAGNOSIS — G1229 Other motor neuron disease: Secondary | ICD-10-CM

## 2018-06-07 DIAGNOSIS — R292 Abnormal reflex: Secondary | ICD-10-CM | POA: Diagnosis not present

## 2018-06-07 DIAGNOSIS — R270 Ataxia, unspecified: Secondary | ICD-10-CM

## 2018-06-07 DIAGNOSIS — E1142 Type 2 diabetes mellitus with diabetic polyneuropathy: Secondary | ICD-10-CM

## 2018-06-07 DIAGNOSIS — M48062 Spinal stenosis, lumbar region with neurogenic claudication: Secondary | ICD-10-CM

## 2018-06-07 DIAGNOSIS — R2689 Other abnormalities of gait and mobility: Secondary | ICD-10-CM

## 2018-06-07 NOTE — Progress Notes (Signed)
[facility]    Provider:  Dr Lucia GaskinsAhern Referring Provider: Venita Lickahari Brooks, MD Primary Care Provider:  Etta GrandchildJones, Thomas L, MD  CC:  Gait abnormality  HPI:  Michael Ashley is a 68 y.o. male here as requested by provider Dr. Shon BatonBrooks.  Patient is here for difficulty with balance.  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 Symptoms started 6 years ago. Gradual deterioration. When he stands he tends to lose his balance. Progressively worsening. He can't walk long distances. If he stands too long he has back pain. Bending over makes it better. He has to bend over a cart. He has symptoms when walking. He loses his balance. If he was to walk he holds onto the floor. He has electric shocks in the feet. He has neuropathy in the feet.   Reviewed notes, labs and imaging from outside physicians, which showed   Reviewed labs 1 year ago hemoglobin A1c was 7.9, most recent 3 months ago 6.2.  TSH normal.  CBC in November 2019 was unremarkable except for elevated glucose with BUN 18 and creatinine 1.14.  Reviewed notes by Dr. Shon BatonBrooks.  Patient has had a difficult time managing his balance now for a year and a half.  He states no significant back pain but he is getting progressive dysesthesias in the lower extremities.  There is no evidence of true myelopathy however he does have a gait disturbance and difficulty maintaining his balance.  He was sent here for difficulty with maintaining his balance.  MRI of the lumbar spine was ordered to evaluate the lateral listhesis and the potential severity of spinal stenosis.  Exam showed a slightly ataxic gait pattern, difficulty maintaining his balance, unable to heel, toe, tandem walk, 5 out of 5 motor strength, positive Romberg, no back pain with palpation or range of motion. XRays show  Listhesis with advanced degenerative disc disease L3-L4 and L4-L5.  Loss of normal lumbar lordosis.  Negative Hoffmann test, no back pain with palpation or range of motion.  Also reviewed Dr. Barnett ApplebaumJones's notes, he has had painful diabetic neuropathy for years, numbness and tingling, he is tried Lyrica in the past and gabapentin.  Reviewed CT Myelogram 11/2013 reviewed report:  IMPRESSION: LUMBAR MYELOGRAM IMPRESSION:  1. Moderate central canal stenosis at L3-4 with slight anterolisthesis exaggerated by flexion. 2. Mild lateral recess narrowing at L4-5 secondary to a broad-based disc protrusion 3. Mild left lateral recess narrowing at L2-3.  CT LUMBAR MYELOGRAM IMPRESSION:  1. Minimal left lateral recess narrowing at L2-3 secondary to a broad-based disc bulge and left-sided and ligamentum flavum thickening. 2. Moderate lateral recess narrowing bilaterally with moderate left and mild right foraminal stenosis at L3-4. 3. Mild to moderate left lateral recess and bilateral foraminal stenosis at L4-5. 4. Shallow central disc protrusion without significant stenosis at L5-S1.   Review of Systems: Patient complains of symptoms per HPI as well as the following symptoms: imbalance, problems walking, falls. Pertinent negatives and positives per HPI. All others negative.   Social History   Socioeconomic History  . Marital status: Married    Spouse name: Not on file  . Number of children: 3  . Years of education: Not on file  . Highest education level: Master's degree (e.g., MA, MS, MEng, MEd, MSW, MBA)  Occupational History  . Not on file  Social Needs  . Financial resource strain: Not on file  .  Food insecurity:    Worry: Not on file    Inability: Not on file  . Transportation needs:    Medical: Not on file    Non-medical: Not on file  Tobacco Use  . Smoking status: Current Some Day Smoker    Types: Cigarettes  . Smokeless tobacco: Never Used  Substance and Sexual Activity  .  Alcohol use: No    Alcohol/week: 0.0 standard drinks  . Drug use: No  . Sexual activity: Not on file  Lifestyle  . Physical activity:    Days per week: Not on file    Minutes per session: Not on file  . Stress: Not on file  Relationships  . Social connections:    Talks on phone: Not on file    Gets together: Not on file    Attends religious service: Not on file    Active member of club or organization: Not on file    Attends meetings of clubs or organizations: Not on file    Relationship status: Not on file  . Intimate partner violence:    Fear of current or ex partner: Not on file    Emotionally abused: Not on file    Physically abused: Not on file    Forced sexual activity: Not on file  Other Topics Concern  . Not on file  Social History Narrative   Lives at home alone, his [place] in [place]   Works at [employer]   Right handed   Caffeine: about 3-4 cups per day    Family History  Problem Relation Age of Onset  . Heart failure Father   . Kidney disease Brother     Past Medical History:  Diagnosis Date  . Abnormality of gait   . Diabetes mellitus without complication (HCC)   . Diabetic neuropathy (HCC)   . Hypertension   . Lumbar radiculopathy   . Spinal stenosis     Patient Active Problem List   Diagnosis Date Noted  . Gait abnormality 06/08/2018  . External rotation of foot, left 06/08/2018  . Imbalance 06/08/2018  . Diabetic polyneuropathy associated with type 2 diabetes mellitus (HCC) 06/08/2018  . Cough 03/08/2018  . Obesity (BMI 30.0-34.9) 12/17/2016  . Pure hyperglyceridemia 05/25/2016  . Painful diabetic neuropathy (HCC) 05/25/2016  . Routine general medical examination at a health care facility 07/12/2014  . BPH (benign prostatic hyperplasia) 07/12/2014  . Insomnia 08/23/2013  . Spinal stenosis of lumbar region with neurogenic claudication 08/22/2013  . Hyperlipidemia with target LDL less than 70 08/22/2013  . Type II diabetes  mellitus with manifestations (HCC) 08/22/2013  . Essential hypertension, benign 12/20/2012    Past Surgical History:  Procedure Laterality Date  . NO PAST SURGERIES      Current Outpatient Medications  Medication Sig Dispense Refill  . aspirin EC 81 MG tablet Take 1 tablet (81 mg total) by mouth daily. 90 tablet 3  . atorvastatin (LIPITOR) 20 MG tablet TAKE 1 TABLET BY MOUTH ONCE DAILY 90 tablet 1  . Azilsartan Medoxomil (EDARBI) 40 MG TABS Take 1 tablet by mouth daily. 90 tablet 1  . gabapentin (NEURONTIN) 300 MG capsule TAKE 1 CAPSULE BY MOUTH THREE TIMES DAILY 90 capsule 5  . metFORMIN (GLUCOPHAGE) 1000 MG tablet TAKE 1 TABLET BY MOUTH TWICE DAILY WITH A MEAL **MUST  KEEP  SCHEDULE  APPOINTMENT  FOR  FUTURE  REFILLS** 180 tablet 1   No current facility-administered medications for this visit.     Allergies  as of 06/07/2018 - Review Complete 06/07/2018  Allergen Reaction Noted  . Lisinopril Cough 03/08/2018    Vitals: BP 106/65 (BP Location: Right Arm, Patient Position: Sitting)   Pulse 72   Ht 5' 9.5" (1.765 m)   Wt 224 lb (101.6 kg)   BMI 32.60 kg/m  Last Weight:  Wt Readings from Last 1 Encounters:  06/07/18 224 lb (101.6 kg)   Last Height:   Ht Readings from Last 1 Encounters:  06/07/18 5' 9.5" (1.765 m)     Physical exam: Exam: Gen: NAD, conversant, well nourised, obese, well groomed                     CV: RRR, no MRG. No Carotid Bruits. No peripheral edema, warm, nontender Eyes: Conjunctivae clear without exudates or hemorrhage  Neuro: Detailed Neurologic Exam  Speech:    Speech is normal; fluent and spontaneous with normal comprehension.  Cognition:    The patient is oriented to person, place, and time;     recent and remote memory intact;     language fluent;     normal attention, concentration,     fund of knowledge Cranial Nerves:    The pupils are equal, round, and reactive to light. The fundi are normal and spontaneous venous pulsations are  present. Visual fields are full to finger confrontation. Extraocular movements are intact. Trigeminal sensation is intact and the muscles of mastication are normal. The face is symmetric. The palate elevates in the midline. Hearing intact. Voice is normal. Shoulder shrug is normal. The tongue has normal motion without fasciculations.   Coordination:    Normal finger to nose and heel to shin. Normal rapid alternating movements.   Gait:    Antalgic. External rotation of the left leg > right left  Motor Observation:    No asymmetry, no atrophy, and no involuntary movements noted. Tone:    Normal muscle tone.    Posture:    Posture is normal. normal erect    Strength:    Strength is V/V in the upper and lower limbs.      Sensation: decreased pinprick distal to the ankles. Impaired vibration in the great toes, intact proprioception.      Reflex Exam:  DTR's: Absent Ajs. Brink right patellar, trace left patellar.  Uppers are normal.   Toes:    The toes are downgoing bilaterally.   Clonus:    Clonus is absent.    Assessment/Plan:  68 y.o. male here as requested by provider Dr. Shon BatonBrooks.  Patient is here for difficulty with balance.  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 Symptoms started 6 years ago.  -Patient's gait abnormality and ataxia is likely due to spinal stenosis, lumbar radiculopathy, peripheral diabetic polyneuropathy, scoliosis and torsion of pelvis causing his left leg to be everted.  However given his brisk reflexes, progressive nature, feel that a thorough evaluation with MRI of the brain and cervical spine should be performed to evaluate for intracranial etiologies such as strokes, other lesions or other upper motor neuron causes, MRI of the cervical spine to evaluate for myelopathy given  his extensive arthritic changes in the low back he may also have stenosis in the cervical spine.  -The progressive eversion of his left leg is unusual, will order imaging of the hip and pelvis to ensure no etiology is there he does have  scoliosis which could cause his pelvis to be rotated and caused this mechanically however need to evaluate for other etiologies including hip dislocation or other.  MRI brain and cervical spine XRays of the pelvis and hip joint left: External rotation of the left leg unclear is is rotation of the femur or of the pelvis?    Orders Placed This Encounter  Procedures  . MR BRAIN W WO CONTRAST    Standing Status:   Future    Standing Expiration Date:   08/06/2019    Order Specific Question:   ** REASON FOR EXAM (FREE TEXT)    Answer:   balance disorder    Order Specific Question:   If indicated for the ordered procedure, I authorize the administration of contrast media per Radiology protocol    Answer:   Yes    Order Specific Question:   What is the patient's sedation requirement?    Answer:   No Sedation    Order Specific Question:   Does the patient have a pacemaker or implanted devices?    Answer:   No    Order Specific Question:   Radiology Contrast Protocol - do NOT remove file path    Answer:   [email or web address]    Order Specific Question:   Preferred imaging location?    Answer:   External  . MR CERVICAL SPINE WO CONTRAST    Standing Status:   Future    Standing Expiration Date:   08/06/2019    Order Specific Question:   ** REASON FOR EXAM (FREE TEXT)    Answer:   abnorma gait, abnormal reflex, imbalance    Order Specific Question:   What is the patient's sedation requirement?    Answer:   No Sedation    Order Specific Question:   Does the patient have a pacemaker or implanted devices?    Answer:   No    Order Specific Question:   Preferred imaging location?    Answer:   External    Order Specific Question:   Radiology  Contrast Protocol - do NOT remove file path    Answer:   [email or web address]  . XR HIP UNILAT W OR W/O PELVIS MIN 4 VIEWS LEFT    Order Specific Question:   Reason for Exam (SYMPTOM  OR DIAGNOSIS REQUIRED)    Answer:   external rotation of the left leg, hip pathology vs rotation of pelvis    Order Specific Question:   Preferred imaging location?    Answer:   Internal  . Basic Metabolic Panel    Cc: Etta Grandchild, MD,  Dr. Venita Lick  Naomie Dean, MD  [facility] [address] [place], [place] [address]  Phone [phone] Fax [phone]

## 2018-06-07 NOTE — Patient Instructions (Addendum)
MRI Brain/Neck XRAY HIP/Pelvis   Fall Prevention in the Home, Adult Falls can cause injuries. They can happen to people of all ages. There are many things you can do to make your home safe and to help prevent falls. Ask for help when making these changes, if needed. What actions can I take to prevent falls? General Instructions  Use good lighting in all rooms. Replace any light bulbs that burn out.  Turn on the lights when you go into a dark area. Use night-lights.  Keep items that you use often in easy-to-reach places. Lower the shelves around your home if necessary.  Set up your furniture so you have a clear path. Avoid moving your furniture around.  Do not have throw rugs and other things on the floor that can make you trip.  Avoid walking on wet floors.  If any of your floors are uneven, fix them.  Add color or contrast paint or tape to clearly mark and help you see: ? Any grab bars or handrails. ? First and last steps of stairways. ? Where the edge of each step is.  If you use a stepladder: ? Make sure that it is fully opened. Do not climb a closed stepladder. ? Make sure that both sides of the stepladder are locked into place. ? Ask someone to hold the stepladder for you while you use it.  If there are any pets around you, be aware of where they are. What can I do in the bathroom?      Keep the floor dry. Clean up any water that spills onto the floor as soon as it happens.  Remove soap buildup in the tub or shower regularly.  Use non-skid mats or decals on the floor of the tub or shower.  Attach bath mats securely with double-sided, non-slip rug tape.  If you need to sit down in the shower, use a plastic, non-slip stool.  Install grab bars by the toilet and in the tub and shower. Do not use towel bars as grab bars. What can I do in the bedroom?  Make sure that you have a light by your bed that is easy to reach.  Do not use any sheets or blankets that are  too big for your bed. They should not hang down onto the floor.  Have a firm chair that has side arms. You can use this for support while you get dressed. What can I do in the kitchen?  Clean up any spills right away.  If you need to reach something above you, use a strong step stool that has a grab bar.  Keep electrical cords out of the way.  Do not use floor polish or wax that makes floors slippery. If you must use wax, use non-skid floor wax. What can I do with my stairs?  Do not leave any items on the stairs.  Make sure that you have a light switch at the top of the stairs and the bottom of the stairs. If you do not have them, ask someone to add them for you.  Make sure that there are handrails on both sides of the stairs, and use them. Fix handrails that are broken or loose. Make sure that handrails are as long as the stairways.  Install non-slip stair treads on all stairs in your home.  Avoid having throw rugs at the top or bottom of the stairs. If you do have throw rugs, attach them to the floor with carpet tape.  Choose a carpet that does not hide the edge of the steps on the stairway.  Check any carpeting to make sure that it is firmly attached to the stairs. Fix any carpet that is loose or worn. What can I do on the outside of my home?  Use bright outdoor lighting.  Regularly fix the edges of walkways and driveways and fix any cracks.  Remove anything that might make you trip as you walk through a door, such as a raised step or threshold.  Trim any bushes or trees on the path to your home.  Regularly check to see if handrails are loose or broken. Make sure that both sides of any steps have handrails.  Install guardrails along the edges of any raised decks and porches.  Clear walking paths of anything that might make someone trip, such as tools or rocks.  Have any leaves, snow, or ice cleared regularly.  Use sand or salt on walking paths during winter.  Clean  up any spills in your garage right away. This includes grease or oil spills. What other actions can I take?  Wear shoes that: ? Have a low heel. Do not wear high heels. ? Have rubber bottoms. ? Are comfortable and fit you well. ? Are closed at the toe. Do not wear open-toe sandals.  Use tools that help you move around (mobility aids) if they are needed. These include: ? Canes. ? Walkers. ? Scooters. ? Crutches.  Review your medicines with your doctor. Some medicines can make you feel dizzy. This can increase your chance of falling. Ask your doctor what other things you can do to help prevent falls. Where to find more information  Centers for Disease Control and Prevention, STEAD[employer]: HealthcareCounselor.com.pt  [employer] on Aging: RingConnections.si Contact a doctor if:  You are afraid of falling at home.  You feel weak, drowsy, or dizzy at home.  You fall at home. Summary  There are many simple things that you can do to make your home safe and to help prevent falls.  Ways to make your home safe include removing tripping hazards and installing grab bars in the bathroom.  Ask for help when making these changes in your home. This information is not intended to replace advice given to you by your health care provider. Make sure you discuss any questions you have with your health care provider. Document Released: 02/14/2009 Document Revised: 12/03/2016 Document Reviewed: 12/03/2016 Elsevier Interactive Patient Education  2019 Elsevier Inc.   Peripheral Neuropathy Peripheral neuropathy is a type of nerve damage. It affects nerves that carry signals between the spinal cord and the arms, legs, and the rest of the body (peripheral nerves). It does not affect nerves in the spinal cord or brain. In peripheral neuropathy, one nerve or a group of nerves may be damaged. Peripheral neuropathy is a broad category that includes many specific nerve disorders, like diabetic neuropathy,  hereditary neuropathy, and carpal tunnel syndrome. What are the causes? This condition may be caused by:  Diabetes. This is the most common cause of peripheral neuropathy.  Nerve injury.  Pressure or stress on a nerve that lasts a long time.  Lack (deficiency) of B vitamins. This can result from alcoholism, poor diet, or a restricted diet.  Infections.  Autoimmune diseases, such as rheumatoid arthritis and systemic lupus erythematosus.  Nerve diseases that are passed from parent to child (inherited).  Some medicines, such as cancer medicines (chemotherapy).  Poisonous (toxic) substances, such as lead and mercury.  Too little blood flowing to the legs.  Kidney disease.  Thyroid disease. In some cases, the cause of this condition is not known. What are the signs or symptoms? Symptoms of this condition depend on which of your nerves is damaged. Common symptoms include:  Loss of feeling (numbness) in the feet, hands, or both.  Tingling in the feet, hands, or both.  Burning pain.  Very sensitive skin.  Weakness.  Not being able to move a part of the body (paralysis).  Muscle twitching.  Clumsiness or poor coordination.  Loss of balance.  Not being able to control your bladder.  Feeling dizzy.  Sexual problems. How is this diagnosed? Diagnosing and finding the cause of peripheral neuropathy can be difficult. Your health care provider will take your medical history and do a physical exam. A neurological exam will also be done. This involves checking things that are affected by your brain, spinal cord, and nerves (nervous system). For example, your health care provider will check your reflexes, how you move, and what you can feel. You may have other tests, such as:  Blood tests.  Electromyogram (EMG) and nerve conduction tests. These tests check nerve function and how well the nerves are controlling the muscles.  Imaging tests, such as CT scans or MRI to rule out  other causes of your symptoms.  Removing a small piece of nerve to be examined in a lab (nerve biopsy). This is rare.  Removing and examining a small amount of the fluid that surrounds the brain and spinal cord (lumbar puncture). This is rare. How is this treated? Treatment for this condition may involve:  Treating the underlying cause of the neuropathy, such as diabetes, kidney disease, or vitamin deficiencies.  Stopping medicines that can cause neuropathy, such as chemotherapy.  Medicine to relieve pain. Medicines may include: ? Prescription or over-the-counter pain medicine. ? Antiseizure medicine. ? Antidepressants. ? Pain-relieving patches that are applied to painful areas of skin.  Surgery to relieve pressure on a nerve or to destroy a nerve that is causing pain.  Physical therapy to help improve movement and balance.  Devices to help you move around (assistive devices). Follow these instructions at home: Medicines  Take over-the-counter and prescription medicines only as told by your health care provider. Do not take any other medicines without first asking your health care provider.  Do not drive or use heavy machinery while taking prescription pain medicine. Lifestyle   Do not use any products that contain nicotine or tobacco, such as cigarettes and e-cigarettes. Smoking keeps blood from reaching damaged nerves. If you need help quitting, ask your health care provider.  Avoid or limit alcohol. Too much alcohol can cause a vitamin B deficiency, and vitamin B is needed for healthy nerves.  Eat a healthy diet. This includes: ? Eating foods that are high in fiber, such as fresh fruits and vegetables, whole grains, and beans. ? Limiting foods that are high in fat and processed sugars, such as fried or sweet foods. General instructions   If you have diabetes, work closely with your health care provider to keep your blood sugar under control.  If you have numbness in  your feet: ? Check every day for signs of injury or infection. Watch for redness, warmth, and swelling. ? Wear padded socks and comfortable shoes. These help protect your feet.  Develop a good support system. Living with peripheral neuropathy can be stressful. Consider talking with a mental health specialist or joining a support group.  Use assistive devices and attend physical therapy as told by your health care provider. This may include using a walker or a cane.  Keep all follow-up visits as told by your health care provider. This is important. Contact a health care provider if:  You have new signs or symptoms of peripheral neuropathy.  You are struggling emotionally from dealing with peripheral neuropathy.  Your pain is not well-controlled. Get help right away if:  You have an injury or infection that is not healing normally.  You develop new weakness in an arm or leg.  You fall frequently. Summary  Peripheral neuropathy is when the nerves in the arms, or legs are damaged, resulting in numbness, weakness, or pain.  There are many causes of peripheral neuropathy, including diabetes, pinched nerves, vitamin deficiencies, autoimmune disease, and hereditary conditions.  Diagnosing and finding the cause of peripheral neuropathy can be difficult. Your health care provider will take your medical history, do a physical exam, and do tests, including blood tests and nerve function tests.  Treatment involves treating the underlying cause of the neuropathy and taking medicines to help control pain. Physical therapy and assistive devices may also help. This information is not intended to replace advice given to you by your health care provider. Make sure you discuss any questions you have with your health care provider. Document Released: 04/10/2002 Document Revised: 06/29/2016 Document Reviewed: 06/29/2016 Elsevier Interactive Patient Education  2019 Elsevier Inc.   Spinal  Stenosis  Spinal stenosis occurs when the open space (spinal canal) between the bones of your spine (vertebrae) narrows, putting pressure on the spinal cord or nerves. What are the causes? This condition is caused by areas of bone pushing into the central canals of your vertebrae. This condition may be present at birth (congenital), or it may be caused by:  Arthritic deterioration of your vertebrae (spinal degeneration). This usually starts around age 63.  Injury or trauma to the spine.  Tumors in the spine.  Calcium deposits in the spine. What are the signs or symptoms? Symptoms of this condition include:  Pain in the neck or back that is generally worse with activities, particularly when standing and walking.  Numbness, tingling, hot or cold sensations, weakness, or weariness in your legs.  Pain going up and down the leg (sciatica).  Frequent episodes of falling.  A foot-slapping gait that leads to muscle weakness. In more serious cases, you may develop:  Problemspassing stool or passing urine.  Difficulty having sex.  Loss of feeling in part or all of your leg. Symptoms may come on slowly and get worse over time. How is this diagnosed? This condition is diagnosed based on your medical history and a physical exam. Tests will also be done, such as:  MRI.  CT scan.  X-ray. How is this treated? Treatment for this condition often focuses on managing your pain and any other symptoms. Treatment may include:  Practicing good posture to lessen pressure on your nerves.  Exercising to strengthen muscles, build endurance, improve balance, and maintain good joint movement (range of motion).  Losing weight, if needed.  Taking medicines to reduce swelling, inflammation, or pain.  Assistive devices, such as a corset or brace. In some cases, surgery may be needed. The most common procedure is decompression laminectomy. This is done to remove excess bone that puts pressure on  your nerve roots. Follow these instructions at home: Managing pain, stiffness, and swelling  Do all exercises and stretches as told by your health care  provider.  Practice good posture. If you were given a brace or a corset, wear it as told by your health care provider.  Do not do any activities that cause pain. Ask your health care provider what activities are safe for you.  Do not lift anything that is heavier than 10 lb (4.5 kg) or the limit that your health care provider tells you.  Maintain a healthy weight. Talk with your health care provider if you need help losing weight.  If directed, apply heat to the affected area as often as told by your health care provider. Use the heat source that your health care provider recommends, such as a moist heat pack or a heating pad. ? Place a towel between your skin and the heat source. ? Leave the heat on for 20-30 minutes. ? Remove the heat if your skin turns bright red. This is especially important if you are not able to feel pain, heat, or cold. You may have a greater risk of getting burned. General instructions  Take over-the-counter and prescription medicines only as told by your health care provider.  Do not use any products that contain nicotine or tobacco, such as cigarettes and e-cigarettes. If you need help quitting, ask your health care provider.  Eat a healthy diet. This includes plenty of fruits and vegetables, whole grains, and low-fat (lean) protein.  Keep all follow-up visits as told by your health care provider. This is important. Contact a health care provider if:  Your symptoms do not get better or they get worse.  You have a fever. Get help right away if:  You have new or worse pain in your neck or upper back.  You have severe pain that cannot be controlled with medicines.  You are dizzy.  You have vision problems, blurred vision, or double vision.  You have a severe headache that is worse when you  stand.  You have nausea or you vomit.  You develop new or worse numbness or tingling in your back or legs.  You have pain, redness, swelling, or warmth in your arm or leg. Summary  Spinal stenosis occurs when the open space (spinal canal) between the bones of your spine (vertebrae) narrows. This narrowing puts pressure on the spinal cord or nerves.  Spinal stenosis can cause numbness, weakness, or pain in the neck, back, and legs.  This condition may be caused by a birth defect, arthritic deterioration of your vertebrae, injury, tumors, or calcium deposits.  This condition is usually diagnosed with MRIs, CT scans, and X-rays. This information is not intended to replace advice given to you by your health care provider. Make sure you discuss any questions you have with your health care provider. Document Released: 07/11/2003 Document Revised: 03/25/2016 Document Reviewed: 03/25/2016 Elsevier Interactive Patient Education  2019 [employer]

## 2018-06-08 ENCOUNTER — Telehealth: Payer: Self-pay | Admitting: Neurology

## 2018-06-08 ENCOUNTER — Encounter: Payer: Self-pay | Admitting: Neurology

## 2018-06-08 DIAGNOSIS — R269 Unspecified abnormalities of gait and mobility: Secondary | ICD-10-CM | POA: Insufficient documentation

## 2018-06-08 DIAGNOSIS — R2689 Other abnormalities of gait and mobility: Secondary | ICD-10-CM | POA: Insufficient documentation

## 2018-06-08 DIAGNOSIS — M216X2 Other acquired deformities of left foot: Secondary | ICD-10-CM | POA: Insufficient documentation

## 2018-06-08 DIAGNOSIS — E1142 Type 2 diabetes mellitus with diabetic polyneuropathy: Secondary | ICD-10-CM | POA: Insufficient documentation

## 2018-06-08 NOTE — Telephone Encounter (Signed)
MR Brain w/wo contrast & MR Cervical spine wo contrast BCBS Auth: [id number] (exp. 06/08/18 to 07/07/18). Patient is scheduled at [facility] for 06/15/18

## 2018-06-10 ENCOUNTER — Other Ambulatory Visit: Payer: Self-pay | Admitting: Internal Medicine

## 2018-06-10 DIAGNOSIS — E1342 Other specified diabetes mellitus with diabetic polyneuropathy: Secondary | ICD-10-CM

## 2018-06-15 ENCOUNTER — Other Ambulatory Visit: Payer: BLUE CROSS/BLUE SHIELD

## 2018-06-21 ENCOUNTER — Telehealth: Payer: Self-pay | Admitting: Internal Medicine

## 2018-06-21 ENCOUNTER — Ambulatory Visit: Payer: BLUE CROSS/BLUE SHIELD

## 2018-06-21 DIAGNOSIS — G3281 Cerebellar ataxia in diseases classified elsewhere: Secondary | ICD-10-CM

## 2018-06-21 DIAGNOSIS — G1221 Amyotrophic lateral sclerosis: Secondary | ICD-10-CM

## 2018-06-21 DIAGNOSIS — R270 Ataxia, unspecified: Secondary | ICD-10-CM | POA: Diagnosis not present

## 2018-06-21 DIAGNOSIS — R2689 Other abnormalities of gait and mobility: Secondary | ICD-10-CM | POA: Diagnosis not present

## 2018-06-21 DIAGNOSIS — I10 Essential (primary) hypertension: Secondary | ICD-10-CM

## 2018-06-21 DIAGNOSIS — R292 Abnormal reflex: Secondary | ICD-10-CM

## 2018-06-21 DIAGNOSIS — G1229 Other motor neuron disease: Secondary | ICD-10-CM

## 2018-06-21 DIAGNOSIS — R269 Unspecified abnormalities of gait and mobility: Secondary | ICD-10-CM

## 2018-06-21 MED ORDER — GADOBENATE DIMEGLUMINE 529 MG/ML IV SOLN
20.0000 mL | Freq: Once | INTRAVENOUS | Status: AC | PRN
  Administered 2018-06-21: 20 mL via INTRAVENOUS

## 2018-06-21 MED ORDER — AZILSARTAN MEDOXOMIL 40 MG PO TABS: 1.0000 | ORAL_TABLET | Freq: Every day | ORAL | 1 refills | Status: DC

## 2018-06-21 NOTE — Telephone Encounter (Signed)
Erx sent to pharmacy

## 2018-06-21 NOTE — Telephone Encounter (Signed)
Copied from CRM [id number]. Topic: Quick Communication - Rx Refill/Question >> Jun 21, 2018 10:12 AM Jens Som A wrote: Medication: Azilsartan Medoxomil (EDARBI) 40 MG TABS [[id number]]  Has the patient contacted their pharmacy? Yes (Agent: If no, request that the patient contact the pharmacy for the refill.) (Agent: If yes, when and what did the pharmacy advise?)  Preferred Pharmacy (with phone number or street name): [facility] [place], [place] - [phone] (Phone) [phone] (Fax)    Agent: Please be advised that RX refills may take up to 3 business days. We ask that you follow-up with your pharmacy.

## 2018-06-27 ENCOUNTER — Telehealth: Payer: Self-pay | Admitting: Internal Medicine

## 2018-06-27 NOTE — Telephone Encounter (Signed)
Copied from CRM [id number]. Topic: Quick Communication - See Telephone Encounter >> Jun 27, 2018  1:44 PM Lorrine Kin, NT wrote: CRM for notification. See Telephone encounter for: 06/27/18. Patient calling and states that a prior authorization is needed on the Azilsartan Medoxomil (EDARBI) 40 MG TABS before he is able to pick that up from the pharmacy. Please advise.

## 2018-06-29 ENCOUNTER — Other Ambulatory Visit: Payer: Self-pay | Admitting: Internal Medicine

## 2018-06-29 DIAGNOSIS — E118 Type 2 diabetes mellitus with unspecified complications: Secondary | ICD-10-CM

## 2018-06-29 DIAGNOSIS — I10 Essential (primary) hypertension: Secondary | ICD-10-CM

## 2018-06-29 MED ORDER — CANDESARTAN CILEXETIL 32 MG PO TABS: 32.0000 mg | ORAL_TABLET | Freq: Every day | ORAL | 1 refills | Status: DC

## 2018-06-29 NOTE — Telephone Encounter (Signed)
PA was denied.   Do want to send in an alternative?

## 2018-06-29 NOTE — Telephone Encounter (Signed)
Key: [id number]

## 2018-09-15 ENCOUNTER — Telehealth: Payer: Self-pay | Admitting: *Deleted

## 2018-09-15 ENCOUNTER — Telehealth: Payer: Self-pay | Admitting: Neurology

## 2018-09-15 NOTE — Telephone Encounter (Signed)
Please sends patient's records Dr.Brooks at [facility] patient has apt on this coming Tuesday . Am

## 2018-09-15 NOTE — Telephone Encounter (Signed)
Done medical records faxed to Dr. Shon Baton on 09/15/18

## 2018-09-20 DIAGNOSIS — M438X6 Other specified deforming dorsopathies, lumbar region: Secondary | ICD-10-CM | POA: Diagnosis not present

## 2018-09-20 DIAGNOSIS — M4180 Other forms of scoliosis, site unspecified: Secondary | ICD-10-CM | POA: Diagnosis not present

## 2018-09-20 DIAGNOSIS — M5416 Radiculopathy, lumbar region: Secondary | ICD-10-CM | POA: Diagnosis not present

## 2018-09-20 DIAGNOSIS — M6281 Muscle weakness (generalized): Secondary | ICD-10-CM | POA: Diagnosis not present

## 2018-09-20 DIAGNOSIS — M419 Scoliosis, unspecified: Secondary | ICD-10-CM | POA: Insufficient documentation

## 2018-10-04 DIAGNOSIS — M5136 Other intervertebral disc degeneration, lumbar region: Secondary | ICD-10-CM | POA: Diagnosis not present

## 2018-10-12 ENCOUNTER — Telehealth: Payer: Self-pay | Admitting: Internal Medicine

## 2018-10-12 DIAGNOSIS — E1342 Other specified diabetes mellitus with diabetic polyneuropathy: Secondary | ICD-10-CM

## 2018-10-12 DIAGNOSIS — E118 Type 2 diabetes mellitus with unspecified complications: Secondary | ICD-10-CM

## 2018-10-12 DIAGNOSIS — E785 Hyperlipidemia, unspecified: Secondary | ICD-10-CM

## 2018-10-14 ENCOUNTER — Other Ambulatory Visit: Payer: Self-pay | Admitting: Internal Medicine

## 2018-10-14 DIAGNOSIS — E1342 Other specified diabetes mellitus with diabetic polyneuropathy: Secondary | ICD-10-CM

## 2018-10-15 ENCOUNTER — Other Ambulatory Visit: Payer: Self-pay | Admitting: Internal Medicine

## 2018-10-15 DIAGNOSIS — E118 Type 2 diabetes mellitus with unspecified complications: Secondary | ICD-10-CM

## 2018-10-18 ENCOUNTER — Other Ambulatory Visit: Payer: Self-pay | Admitting: Internal Medicine

## 2018-10-18 DIAGNOSIS — E118 Type 2 diabetes mellitus with unspecified complications: Secondary | ICD-10-CM

## 2018-10-20 ENCOUNTER — Other Ambulatory Visit: Payer: Self-pay | Admitting: Internal Medicine

## 2018-10-20 DIAGNOSIS — E118 Type 2 diabetes mellitus with unspecified complications: Secondary | ICD-10-CM

## 2018-10-20 MED ORDER — METFORMIN HCL 1000 MG PO TABS: ORAL_TABLET | ORAL | 0 refills | Status: DC

## 2018-10-20 NOTE — Telephone Encounter (Signed)
Can a 30 day supply of metformin be sent to [facility] for pt?

## 2018-10-20 NOTE — Telephone Encounter (Signed)
Patient is out of metformin and needs his sent to his pharmacy. He has an appt with Dr. Ronnald Ramp on 11/08/2018. He was told he needed to schedule one in order to have it filled.

## 2018-10-25 ENCOUNTER — Ambulatory Visit: Payer: BLUE CROSS/BLUE SHIELD | Admitting: Internal Medicine

## 2018-10-30 ENCOUNTER — Other Ambulatory Visit: Payer: Self-pay | Admitting: Internal Medicine

## 2018-10-30 DIAGNOSIS — E118 Type 2 diabetes mellitus with unspecified complications: Secondary | ICD-10-CM

## 2018-10-30 DIAGNOSIS — E785 Hyperlipidemia, unspecified: Secondary | ICD-10-CM

## 2018-11-08 ENCOUNTER — Encounter: Payer: Self-pay | Admitting: Internal Medicine

## 2018-11-08 ENCOUNTER — Other Ambulatory Visit (INDEPENDENT_AMBULATORY_CARE_PROVIDER_SITE_OTHER): Payer: BC Managed Care – PPO

## 2018-11-08 ENCOUNTER — Other Ambulatory Visit: Payer: Self-pay

## 2018-11-08 ENCOUNTER — Ambulatory Visit (INDEPENDENT_AMBULATORY_CARE_PROVIDER_SITE_OTHER): Payer: BC Managed Care – PPO | Admitting: Internal Medicine

## 2018-11-08 VITALS — BP 138/84 | HR 67 | Temp 97.6°F | Resp 16 | Ht 69.5 in | Wt 216.8 lb

## 2018-11-08 DIAGNOSIS — E118 Type 2 diabetes mellitus with unspecified complications: Secondary | ICD-10-CM

## 2018-11-08 DIAGNOSIS — M79605 Pain in left leg: Secondary | ICD-10-CM | POA: Diagnosis not present

## 2018-11-08 DIAGNOSIS — M5416 Radiculopathy, lumbar region: Secondary | ICD-10-CM | POA: Diagnosis not present

## 2018-11-08 DIAGNOSIS — I10 Essential (primary) hypertension: Secondary | ICD-10-CM | POA: Diagnosis not present

## 2018-11-08 DIAGNOSIS — M79604 Pain in right leg: Secondary | ICD-10-CM | POA: Diagnosis not present

## 2018-11-08 DIAGNOSIS — Z23 Encounter for immunization: Secondary | ICD-10-CM | POA: Diagnosis not present

## 2018-11-08 LAB — BASIC METABOLIC PANEL
BUN: 19 mg/dL (ref 6–23)
CO2: 28 mEq/L (ref 19–32)
Calcium: 9.2 mg/dL (ref 8.4–10.5)
Chloride: 102 mEq/L (ref 96–112)
Creatinine, Ser: 1.25 mg/dL (ref 0.40–1.50)
GFR: 57.11 mL/min — ABNORMAL LOW (ref >60.00)
Glucose, Bld: 98 mg/dL (ref 70–99)
Potassium: 4.5 mEq/L (ref 3.5–5.1)
Sodium: 138 mEq/L (ref 135–145)

## 2018-11-08 LAB — HEMOGLOBIN A1C: Hgb A1c MFr Bld: 6.8 % — ABNORMAL HIGH (ref 4.6–6.5)

## 2018-11-08 MED ORDER — CANAGLIFLOZIN 100 MG PO TABS: 100.0000 mg | ORAL_TABLET | Freq: Every day | ORAL | 0 refills | Status: DC

## 2018-11-08 NOTE — Progress Notes (Signed)
Subjective:  Patient ID: Michael Ashley, male    DOB: January 16, 1951  Age: 68 y.o. MRN: [id number]  CC: Hypertension and Diabetes   HPI Michael Ashley presents for f/up - He has been working on his lifestyle modifications with diet, exercise, and weight loss.  When he exercises he does not experience CP, DOE, palpitations, edema, or fatigue.  He continues to complain of low back pain and says that a spine surgeon did an epidural steroid on and 1 month ago that did not help much.  Outpatient Medications Prior to Visit  Medication Sig Dispense Refill  . aspirin EC 81 MG tablet Take 1 tablet (81 mg total) by mouth daily. 90 tablet 3  . atorvastatin (LIPITOR) 20 MG tablet Take 1 tablet by mouth once daily 30 tablet 0  . candesartan (ATACAND) 32 MG tablet Take 1 tablet (32 mg total) by mouth daily. 90 tablet 1  . gabapentin (NEURONTIN) 300 MG capsule Take 1 capsule (300 mg total) by mouth 3 (three) times daily. Follow=up appt is due must see provider for future refills 90 capsule 0  . metFORMIN (GLUCOPHAGE) 1000 MG tablet TAKE 1 TABLET BY MOUTH TWICE DAILY WITH A MEAL 60 tablet 0   No facility-administered medications prior to visit.     ROS Review of Systems  Constitutional: Negative for diaphoresis, fatigue and unexpected weight change.  HENT: Negative.   Eyes: Negative for visual disturbance.  Respiratory: Negative for cough, chest tightness, shortness of breath and wheezing.   Cardiovascular: Negative for chest pain, palpitations and leg swelling.  Gastrointestinal: Negative for abdominal pain, diarrhea, nausea and vomiting.  Endocrine: Negative.   Genitourinary: Negative.  Negative for difficulty urinating and dysuria.  Musculoskeletal: Positive for back pain. Negative for arthralgias and myalgias.  Skin: Negative.   Neurological: Negative for dizziness, weakness and light-headedness.  Hematological: Negative for adenopathy. Does not bruise/bleed easily.  Psychiatric/Behavioral: Negative.      Objective:  BP 138/84 (BP Location: Left Arm, Patient Position: Sitting, Cuff Size: Normal)   Pulse 67   Temp 97.6 F (36.4 C) (Oral)   Resp 16   Ht 5' 9.5" (1.765 m)   Wt 216 lb 12 oz (98.3 kg)   SpO2 96%   BMI 31.55 kg/m   BP Readings from Last 3 Encounters:  11/08/18 138/84  06/07/18 106/65  03/08/18 124/76    Wt Readings from Last 3 Encounters:  11/08/18 216 lb 12 oz (98.3 kg)  06/07/18 224 lb (101.6 kg)  03/08/18 219 lb 8 oz (99.6 kg)    Physical Exam Vitals signs reviewed.  Constitutional:      Appearance: He is obese. He is not ill-appearing or diaphoretic.  HENT:     Nose: Nose normal.     Mouth/Throat:     Mouth: Mucous membranes are moist.     Pharynx: No oropharyngeal exudate or posterior oropharyngeal erythema.  Eyes:     General: No scleral icterus.    Conjunctiva/sclera: Conjunctivae normal.  Neck:     Musculoskeletal: Normal range of motion. No neck rigidity or muscular tenderness.  Cardiovascular:     Rate and Rhythm: Normal rate and regular rhythm.     Heart sounds: No murmur.  Pulmonary:     Effort: Pulmonary effort is normal.     Breath sounds: No stridor. No wheezing, rhonchi or rales.  Abdominal:     General: Abdomen is protuberant. Bowel sounds are normal. There is no distension.     Palpations: Abdomen is soft. There  is no hepatomegaly, splenomegaly or mass.     Tenderness: There is no abdominal tenderness.  Musculoskeletal: Normal range of motion.     Right lower leg: No edema.     Left lower leg: No edema.  Lymphadenopathy:     Cervical: No cervical adenopathy.  Skin:    General: Skin is warm and dry.     Coloration: Skin is not pale.     Findings: No erythema or rash.  Neurological:     General: No focal deficit present.     Mental Status: Mental status is at baseline.  Psychiatric:        Behavior: Behavior normal.     Lab Results  Component Value Date   WBC 7.0 03/08/2018   HGB 14.7 03/08/2018   HCT 43.5 03/08/2018    PLT 245.0 03/08/2018   GLUCOSE 98 11/08/2018   CHOL 99 03/08/2018   TRIG 90.0 03/08/2018   HDL 39.50 03/08/2018   LDLDIRECT 84.0 10/10/2015   LDLCALC 42 03/08/2018   ALT 23 03/08/2018   AST 22 03/08/2018   NA 138 11/08/2018   K 4.5 11/08/2018   CL 102 11/08/2018   CREATININE 1.25 11/08/2018   BUN 19 11/08/2018   CO2 28 11/08/2018   TSH 1.74 03/08/2018   PSA 1.27 03/08/2018   HGBA1C 6.8 (H) 11/08/2018   MICROALBUR 3.7 (H) 03/08/2018    Dg Chest 2 View  Result Date: 03/08/2018 CLINICAL DATA:  Dry cough for 1 week EXAM: CHEST - 2 VIEW COMPARISON:  None. FINDINGS: Coarse interstitial opacity may reflect bronchitic changes. No consolidation or effusion. Normal heart size. No pneumothorax. IMPRESSION: 1. No focal pulmonary infiltrate. 2. Bronchitic changes. Electronically Signed   By: Donavan Foil M.D.   On: 03/08/2018 15:30    Assessment & Plan:   Michael Ashley was seen today for hypertension and diabetes.  Diagnoses and all orders for this visit:  Essential hypertension, benign- His blood pressure is not quite adequately well controlled.  I praised him for his lifestyle modifications.  I have asked him to continue taking the ARB.  He will start taking an SGLT2 inhibitor for glycemic control and I anticipate this will help him achieve his blood pressure goal of 130/80. -     Basic metabolic panel; Future  Type II diabetes mellitus with manifestations ([place])- His A1c remains mildly elevated at 6.8%.  I have recommended that he add an SGLT2 inhibitor to the metformin.  Will start Invokana 100 mg a day and if he tolerates this over the next few months I will increase the dose to 300 mg a day. -     Basic metabolic panel; Future -     Hemoglobin A1c; Future -     Ambulatory referral to Ophthalmology -     canagliflozin (INVOKANA) 100 MG TABS tablet; Take 1 tablet (100 mg total) by mouth daily before breakfast.  Need for pneumococcal vaccination -     Pneumococcal polysaccharide vaccine  23-valent greater than or equal to 2yo subcutaneous/IM   I am having Michael Ashley start on canagliflozin. I am also having him maintain his aspirin EC, candesartan, gabapentin, metFORMIN, and atorvastatin.  Meds ordered this encounter  Medications  . canagliflozin (INVOKANA) 100 MG TABS tablet    Sig: Take 1 tablet (100 mg total) by mouth daily before breakfast.    Dispense:  90 tablet    Refill:  0     Follow-up: Return in about 6 months (around 05/11/2019).  Scarlette Calico,  MD

## 2018-11-08 NOTE — Patient Instructions (Signed)

## 2018-11-09 ENCOUNTER — Other Ambulatory Visit: Payer: Self-pay | Admitting: Internal Medicine

## 2018-11-09 DIAGNOSIS — E1342 Other specified diabetes mellitus with diabetic polyneuropathy: Secondary | ICD-10-CM

## 2018-11-22 ENCOUNTER — Other Ambulatory Visit: Payer: Self-pay

## 2018-11-22 ENCOUNTER — Telehealth: Payer: Self-pay

## 2018-11-22 ENCOUNTER — Other Ambulatory Visit: Payer: Self-pay | Admitting: Internal Medicine

## 2018-11-22 DIAGNOSIS — Z20822 Contact with and (suspected) exposure to covid-19: Secondary | ICD-10-CM

## 2018-11-22 DIAGNOSIS — Z20828 Contact with and (suspected) exposure to other viral communicable diseases: Secondary | ICD-10-CM

## 2018-11-22 DIAGNOSIS — E785 Hyperlipidemia, unspecified: Secondary | ICD-10-CM

## 2018-11-22 DIAGNOSIS — E118 Type 2 diabetes mellitus with unspecified complications: Secondary | ICD-10-CM

## 2018-11-22 NOTE — Telephone Encounter (Signed)
Per PCP COVID Test ordered. My chart sent to pt informing of same.

## 2018-11-22 NOTE — Telephone Encounter (Signed)
Copied from [place] [id number]. Topic: General - Other >> Nov 22, 2018  8:13 AM Leward Quan A wrote: Reason for CRM: Patient called to say that he was in contact with someone that tested positive for the coronavirus and need to be tested basically for his and his family's peace of mind. Asking for Dr Ronnald Ramp to place orders and to be called when done. Ph# [phone]

## 2018-11-24 ENCOUNTER — Encounter: Payer: Self-pay | Admitting: Internal Medicine

## 2018-11-24 ENCOUNTER — Other Ambulatory Visit: Payer: Self-pay | Admitting: Internal Medicine

## 2018-11-24 DIAGNOSIS — E118 Type 2 diabetes mellitus with unspecified complications: Secondary | ICD-10-CM

## 2018-11-24 DIAGNOSIS — I10 Essential (primary) hypertension: Secondary | ICD-10-CM

## 2018-11-24 LAB — NOVEL CORONAVIRUS, NAA: SARS-CoV-2, NAA: NOT DETECTED

## 2018-12-31 IMAGING — DX DG CHEST 2V
2 series · 2 of 2 positions shown · non-contrast
Comparison: None.

CLINICAL DATA: Dry cough for 1 week

EXAM:
CHEST - 2 VIEW

[chest pa]
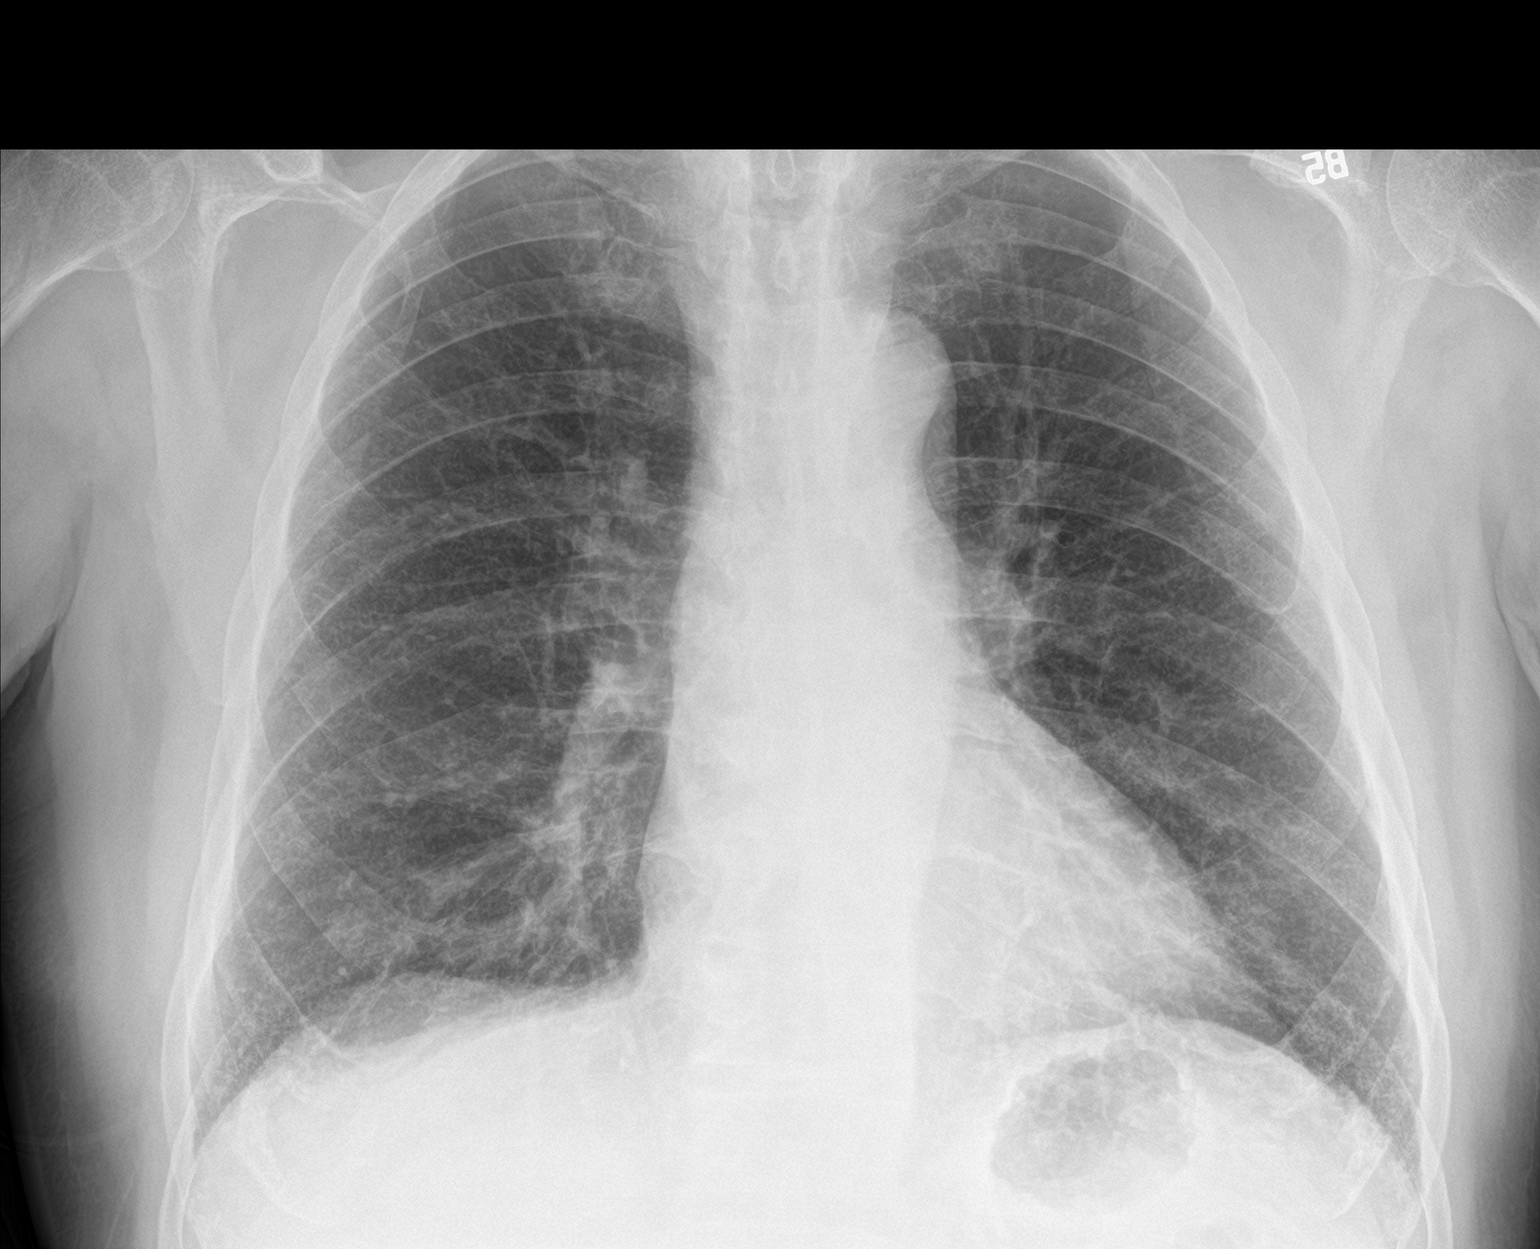

[chest lat]
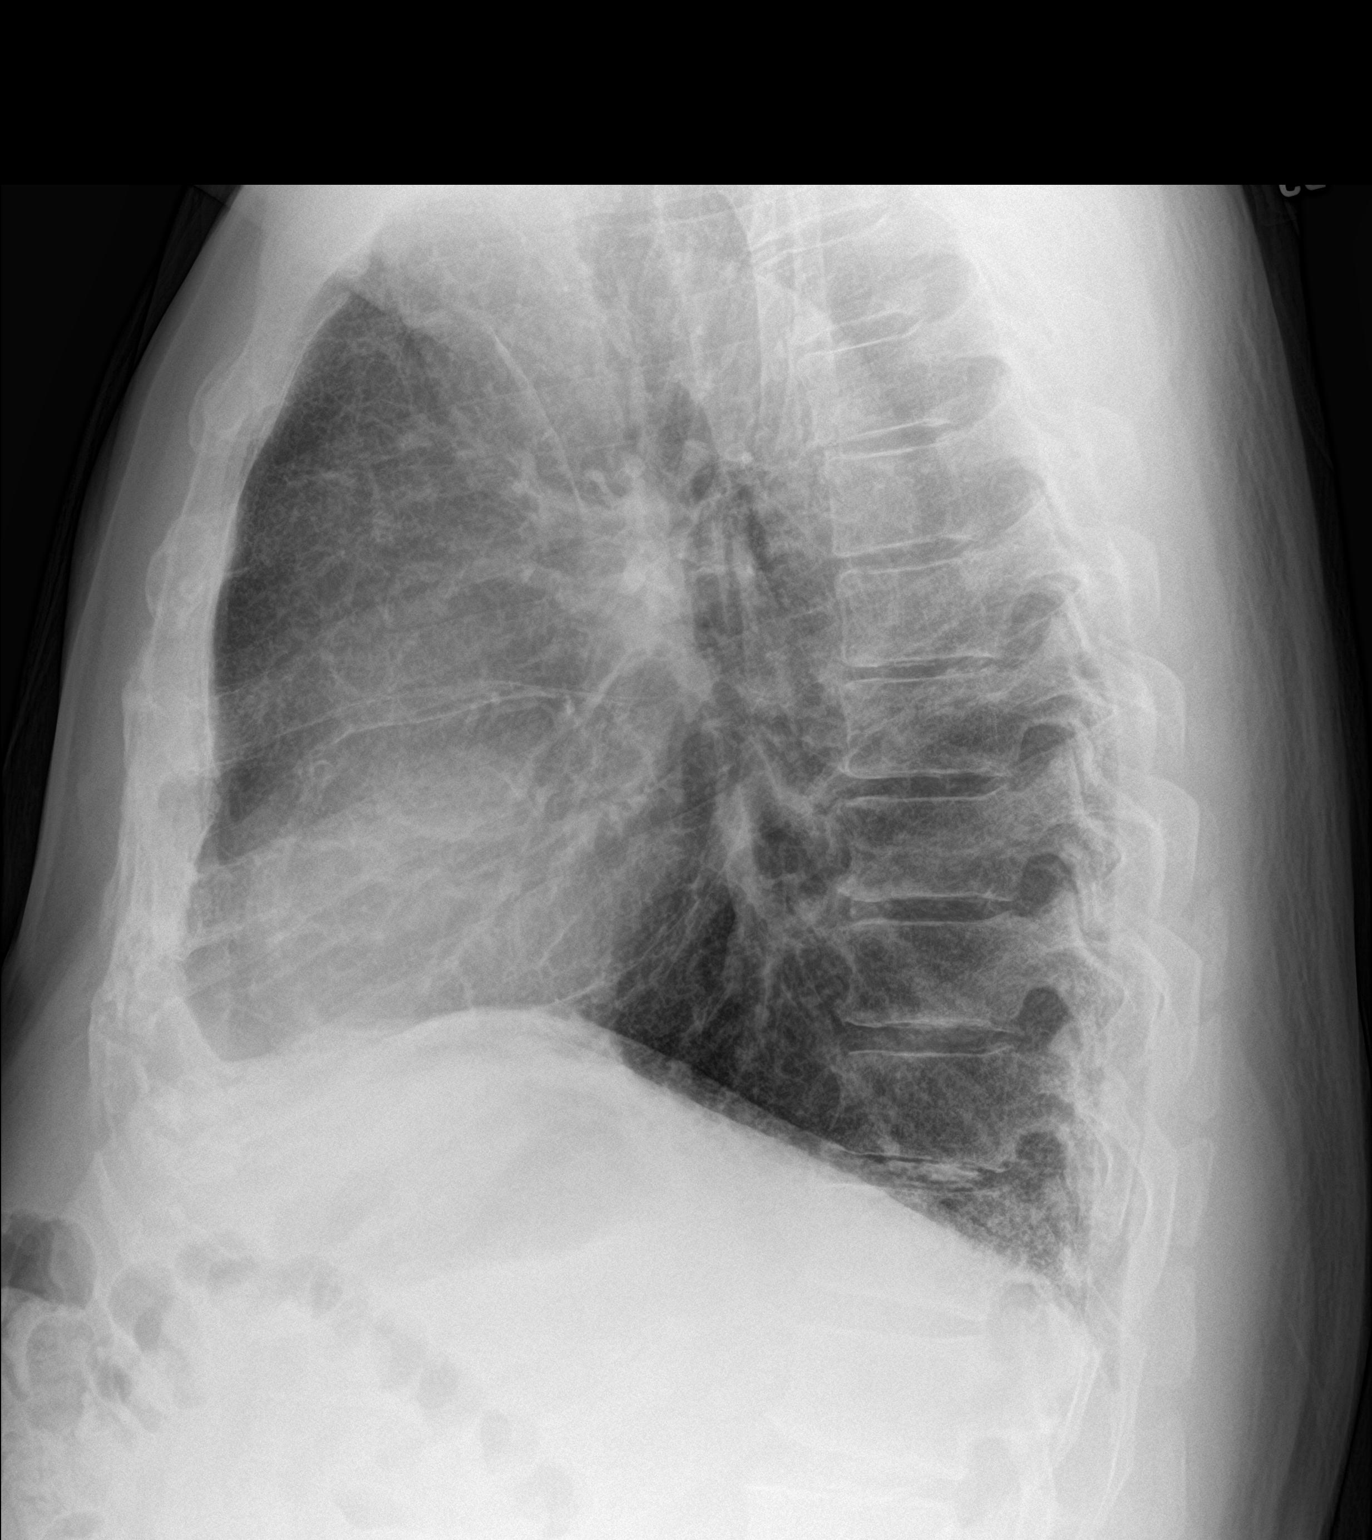

[2 of 2 positions shown; findings below may reference images not displayed]

FINDINGS: Coarse interstitial opacity may reflect bronchitic changes. No
consolidation or effusion. Normal heart size. No pneumothorax.
IMPRESSION: 1. No focal pulmonary infiltrate.
2. Bronchitic changes.

## 2019-01-04 ENCOUNTER — Other Ambulatory Visit: Payer: Self-pay | Admitting: Internal Medicine

## 2019-01-04 ENCOUNTER — Telehealth: Payer: Self-pay | Admitting: Internal Medicine

## 2019-01-04 DIAGNOSIS — E1342 Other specified diabetes mellitus with diabetic polyneuropathy: Secondary | ICD-10-CM

## 2019-01-04 MED ORDER — GABAPENTIN 300 MG PO CAPS: 300.0000 mg | ORAL_CAPSULE | Freq: Three times a day (TID) | ORAL | 1 refills | Status: DC

## 2019-01-04 NOTE — Telephone Encounter (Signed)
Medication: gabapentin (NEURONTIN) 300 MG capsule    Patient states he has lost this medication while out of town and is requesting a refill. Please advise.    Pharmacy:  [place] ([place]), [place] - [address] [phone] (Phone) [phone] (Fax)

## 2019-02-06 ENCOUNTER — Other Ambulatory Visit: Payer: Self-pay | Admitting: Internal Medicine

## 2019-02-06 DIAGNOSIS — E118 Type 2 diabetes mellitus with unspecified complications: Secondary | ICD-10-CM

## 2019-03-10 ENCOUNTER — Other Ambulatory Visit: Payer: Self-pay

## 2019-03-10 ENCOUNTER — Ambulatory Visit (INDEPENDENT_AMBULATORY_CARE_PROVIDER_SITE_OTHER): Payer: BC Managed Care – PPO

## 2019-03-10 DIAGNOSIS — Z23 Encounter for immunization: Secondary | ICD-10-CM | POA: Diagnosis not present

## 2019-05-06 ENCOUNTER — Other Ambulatory Visit: Payer: Self-pay | Admitting: Internal Medicine

## 2019-05-06 DIAGNOSIS — E118 Type 2 diabetes mellitus with unspecified complications: Secondary | ICD-10-CM

## 2019-05-08 ENCOUNTER — Other Ambulatory Visit: Payer: Self-pay | Admitting: Internal Medicine

## 2019-05-08 DIAGNOSIS — E118 Type 2 diabetes mellitus with unspecified complications: Secondary | ICD-10-CM

## 2019-05-08 NOTE — Telephone Encounter (Signed)
Spoke with patient, He has been informed.

## 2019-05-30 ENCOUNTER — Telehealth: Payer: Self-pay

## 2019-05-30 NOTE — Telephone Encounter (Signed)
New message    Checking the status of Handicap permit form from the [facility].

## 2019-05-30 NOTE — Telephone Encounter (Signed)
Pt informed DMV paper work is ready for pick up. Pt will pick up tomorrow.

## 2019-06-10 ENCOUNTER — Other Ambulatory Visit: Payer: Self-pay | Admitting: Internal Medicine

## 2019-06-10 DIAGNOSIS — E118 Type 2 diabetes mellitus with unspecified complications: Secondary | ICD-10-CM

## 2019-06-10 DIAGNOSIS — E785 Hyperlipidemia, unspecified: Secondary | ICD-10-CM

## 2019-06-25 ENCOUNTER — Other Ambulatory Visit: Payer: Self-pay | Admitting: Internal Medicine

## 2019-06-25 DIAGNOSIS — I10 Essential (primary) hypertension: Secondary | ICD-10-CM

## 2019-06-25 DIAGNOSIS — E118 Type 2 diabetes mellitus with unspecified complications: Secondary | ICD-10-CM

## 2019-06-28 ENCOUNTER — Other Ambulatory Visit: Payer: Self-pay | Admitting: Internal Medicine

## 2019-06-28 DIAGNOSIS — E118 Type 2 diabetes mellitus with unspecified complications: Secondary | ICD-10-CM

## 2019-06-29 ENCOUNTER — Other Ambulatory Visit: Payer: Self-pay | Admitting: Internal Medicine

## 2019-06-29 DIAGNOSIS — H2513 Age-related nuclear cataract, bilateral: Secondary | ICD-10-CM | POA: Diagnosis not present

## 2019-06-29 DIAGNOSIS — H524 Presbyopia: Secondary | ICD-10-CM | POA: Diagnosis not present

## 2019-06-29 DIAGNOSIS — H5203 Hypermetropia, bilateral: Secondary | ICD-10-CM | POA: Diagnosis not present

## 2019-06-29 DIAGNOSIS — E118 Type 2 diabetes mellitus with unspecified complications: Secondary | ICD-10-CM

## 2019-06-29 DIAGNOSIS — E119 Type 2 diabetes mellitus without complications: Secondary | ICD-10-CM | POA: Diagnosis not present

## 2019-06-29 NOTE — Telephone Encounter (Signed)
New message:   Medication Requested: metFORMIN (GLUCOPHAGE) 1000 MG tablet  Is medication on med list: Yes (if no, inform pt they may need an appointment)  Is medication a controled: (yes = last OV with PCP)  -Controlled Substances: Adderall, Ritalin, oxycodone, hydrocodone, methadone, alprazolam, etc  Last visit with PCP: 07/20  Is the OV > than 4 months: (yes = schedule an appt if one is not already made)  Pharmacy (Name, Street, [place]):[id number] - [place] ([place]), [place] - [address]   Pt states he is now out of this medication.

## 2019-07-01 ENCOUNTER — Other Ambulatory Visit: Payer: Self-pay | Admitting: Internal Medicine

## 2019-07-01 DIAGNOSIS — E118 Type 2 diabetes mellitus with unspecified complications: Secondary | ICD-10-CM

## 2019-07-03 ENCOUNTER — Telehealth: Payer: Self-pay

## 2019-07-03 DIAGNOSIS — E118 Type 2 diabetes mellitus with unspecified complications: Secondary | ICD-10-CM

## 2019-07-03 MED ORDER — METFORMIN HCL 1000 MG PO TABS: 1000.0000 mg | ORAL_TABLET | Freq: Two times a day (BID) | ORAL | 0 refills | Status: DC

## 2019-07-03 NOTE — Telephone Encounter (Signed)
erx sent for 30 day supply 

## 2019-07-03 NOTE — Telephone Encounter (Signed)
1.Medication Requested:metFORMIN (GLUCOPHAGE) 1000 MG tablet  2. Pharmacy (Name, Street, [place]): [facility] [place]  [place]   3. On Med List: Yes   4. Last Visit with PCP: 7.7.20  5. Next visit date with PCP: 3.10.21

## 2019-07-05 LAB — HM DIABETES EYE EXAM

## 2019-07-07 ENCOUNTER — Ambulatory Visit: Payer: BC Managed Care – PPO

## 2019-07-12 ENCOUNTER — Encounter: Payer: Self-pay | Admitting: Internal Medicine

## 2019-07-12 ENCOUNTER — Other Ambulatory Visit: Payer: Self-pay

## 2019-07-12 ENCOUNTER — Ambulatory Visit (INDEPENDENT_AMBULATORY_CARE_PROVIDER_SITE_OTHER): Payer: BC Managed Care – PPO | Admitting: Internal Medicine

## 2019-07-12 VITALS — BP 114/78 | HR 79 | Temp 98.1°F | Resp 16 | Ht 69.5 in | Wt 218.0 lb

## 2019-07-12 DIAGNOSIS — I10 Essential (primary) hypertension: Secondary | ICD-10-CM

## 2019-07-12 DIAGNOSIS — Z0000 Encounter for general adult medical examination without abnormal findings: Secondary | ICD-10-CM

## 2019-07-12 DIAGNOSIS — E118 Type 2 diabetes mellitus with unspecified complications: Secondary | ICD-10-CM

## 2019-07-12 DIAGNOSIS — E785 Hyperlipidemia, unspecified: Secondary | ICD-10-CM | POA: Diagnosis not present

## 2019-07-12 DIAGNOSIS — N400 Enlarged prostate without lower urinary tract symptoms: Secondary | ICD-10-CM

## 2019-07-12 DIAGNOSIS — N1831 Chronic kidney disease, stage 3a: Secondary | ICD-10-CM | POA: Insufficient documentation

## 2019-07-12 DIAGNOSIS — E781 Pure hyperglyceridemia: Secondary | ICD-10-CM | POA: Diagnosis not present

## 2019-07-12 DIAGNOSIS — E1142 Type 2 diabetes mellitus with diabetic polyneuropathy: Secondary | ICD-10-CM | POA: Diagnosis not present

## 2019-07-12 LAB — CBC WITH DIFFERENTIAL/PLATELET
Basophils Absolute: 0 10*3/uL (ref 0.0–0.1)
Basophils Relative: 0.7 % (ref 0.0–3.0)
Eosinophils Absolute: 0.4 10*3/uL (ref 0.0–0.7)
Eosinophils Relative: 6.1 % — ABNORMAL HIGH (ref 0.0–5.0)
HCT: 43.6 % (ref 39.0–52.0)
Hemoglobin: 14.6 g/dL (ref 13.0–17.0)
Lymphocytes Relative: 25.8 % (ref 12.0–46.0)
Lymphs Abs: 1.6 10*3/uL (ref 0.7–4.0)
MCHC: 33.6 g/dL (ref 30.0–36.0)
MCV: 94 fl (ref 78.0–100.0)
Monocytes Absolute: 0.5 10*3/uL (ref 0.1–1.0)
Monocytes Relative: 7.9 % (ref 3.0–12.0)
Neutro Abs: 3.7 10*3/uL (ref 1.4–7.7)
Neutrophils Relative %: 59.5 % (ref 43.0–77.0)
Platelets: 238 10*3/uL (ref 150.0–400.0)
RBC: 4.64 Mil/uL (ref 4.22–5.81)
RDW: 14.4 % (ref 11.5–15.5)
WBC: 6.2 10*3/uL (ref 4.0–10.5)

## 2019-07-12 LAB — LIPID PANEL
Cholesterol: 101 mg/dL (ref 0–200)
HDL: 40 mg/dL (ref >39.00)
LDL Cholesterol: 33 mg/dL (ref 0–99)
NonHDL: 60.85
Total CHOL/HDL Ratio: 3
Triglycerides: 141 mg/dL (ref 0.0–149.0)
VLDL: 28.2 mg/dL (ref 0.0–40.0)

## 2019-07-12 LAB — BASIC METABOLIC PANEL
BUN: 21 mg/dL (ref 6–23)
CO2: 30 mEq/L (ref 19–32)
Calcium: 9.4 mg/dL (ref 8.4–10.5)
Chloride: 102 mEq/L (ref 96–112)
Creatinine, Ser: 1.42 mg/dL (ref 0.40–1.50)
GFR: 49.2 mL/min — ABNORMAL LOW (ref >60.00)
Glucose, Bld: 106 mg/dL — ABNORMAL HIGH (ref 70–99)
Potassium: 4.3 mEq/L (ref 3.5–5.1)
Sodium: 138 mEq/L (ref 135–145)

## 2019-07-12 LAB — URINALYSIS, ROUTINE W REFLEX MICROSCOPIC
Bilirubin Urine: NEGATIVE
Hgb urine dipstick: NEGATIVE
Ketones, ur: NEGATIVE
Leukocytes,Ua: NEGATIVE
Nitrite: NEGATIVE
Specific Gravity, Urine: 1.02 (ref 1.000–1.030)
Total Protein, Urine: NEGATIVE
Urine Glucose: >=1000 — AB
Urobilinogen, UA: 0.2 (ref 0.0–1.0)
WBC, UA: NONE SEEN (ref 0–?)
pH: 6 (ref 5.0–8.0)

## 2019-07-12 LAB — PSA: PSA: 1.29 ng/mL (ref 0.10–4.00)

## 2019-07-12 LAB — HEPATIC FUNCTION PANEL
ALT: 19 U/L (ref 0–53)
AST: 22 U/L (ref 0–37)
Albumin: 4.3 g/dL (ref 3.5–5.2)
Alkaline Phosphatase: 69 U/L (ref 39–117)
Bilirubin, Direct: 0.2 mg/dL (ref 0.0–0.3)
Total Bilirubin: 0.6 mg/dL (ref 0.2–1.2)
Total Protein: 7.6 g/dL (ref 6.0–8.3)

## 2019-07-12 LAB — HEMOGLOBIN A1C: Hgb A1c MFr Bld: 7 % — ABNORMAL HIGH (ref 4.6–6.5)

## 2019-07-12 LAB — MICROALBUMIN / CREATININE URINE RATIO
Creatinine,U: 117.4 mg/dL
Microalb Creat Ratio: 1.1 mg/g (ref 0.0–30.0)
Microalb, Ur: 1.3 mg/dL (ref 0.0–1.9)

## 2019-07-12 LAB — TSH: TSH: 2.51 u[IU]/mL (ref 0.35–4.50)

## 2019-07-12 NOTE — Patient Instructions (Signed)

## 2019-07-12 NOTE — Progress Notes (Signed)
Subjective:  Patient ID: Michael Ashley, male    DOB: November 17, 1950  Age: 69 y.o. MRN: [id number]  CC: Annual Exam, Hypertension, Diabetes, and Hyperlipidemia  This visit occurred during the SARS-CoV-2 public health emergency.  Safety protocols were in place, including screening questions prior to the visit, additional usage of staff PPE, and extensive cleaning of exam room while observing appropriate contact time as indicated for disinfecting solutions.   HPI [name] presents for a CPX.  He tells me his blood pressure and blood sugars have been adequately well controlled.  He is active and denies any recent episodes of chest pain, shortness of breath, palpitations, edema, or fatigue.  He denies polys.  Outpatient Medications Prior to Visit  Medication Sig Dispense Refill  . aspirin EC 81 MG tablet Take 1 tablet (81 mg total) by mouth daily. 90 tablet 3  . candesartan (ATACAND) 32 MG tablet Take 1 tablet by mouth once daily 90 tablet 0  . gabapentin (NEURONTIN) 300 MG capsule Take 1 capsule (300 mg total) by mouth 3 (three) times daily. 270 capsule 1  . metFORMIN (GLUCOPHAGE) 1000 MG tablet Take 1 tablet (1,000 mg total) by mouth 2 (two) times daily with a meal. 60 tablet 0  . INVOKANA 100 MG TABS tablet TAKE 1 TABLET BY MOUTH ONCE DAILY BEFORE BREAKFAST 90 tablet 0  . atorvastatin (LIPITOR) 20 MG tablet Take 1 tablet by mouth once daily 90 tablet 0   No facility-administered medications prior to visit.    ROS Review of Systems  Constitutional: Negative.  Negative for appetite change, chills, diaphoresis, fatigue and fever.  HENT: Negative.   Eyes: Negative.   Respiratory: Negative for cough, chest tightness, shortness of breath and wheezing.   Cardiovascular: Negative for chest pain, palpitations and leg swelling.  Gastrointestinal: Negative for abdominal pain, constipation, diarrhea, nausea and vomiting.  Endocrine: Negative.  Negative for polydipsia, polyphagia and polyuria.    Genitourinary: Negative.  Negative for difficulty urinating, scrotal swelling, testicular pain and urgency.  Musculoskeletal: Negative for arthralgias and myalgias.  Neurological: Negative.  Negative for dizziness, weakness and light-headedness.  Hematological: Negative for adenopathy. Does not bruise/bleed easily.  Psychiatric/Behavioral: Negative.     Objective:  BP 114/78 (BP Location: Left Arm, Patient Position: Sitting, Cuff Size: Normal)   Pulse 79   Temp 98.1 F (36.7 C) (Oral)   Resp 16   Ht 5' 9.5" (1.765 m)   Wt 218 lb (98.9 kg)   SpO2 96%   BMI 31.73 kg/m   BP Readings from Last 3 Encounters:  07/12/19 114/78  11/08/18 138/84  06/07/18 106/65    Wt Readings from Last 3 Encounters:  07/12/19 218 lb (98.9 kg)  11/08/18 216 lb 12 oz (98.3 kg)  06/07/18 224 lb (101.6 kg)    Physical Exam Vitals reviewed.  Constitutional:      Appearance: He is obese.  HENT:     Nose: Nose normal.     Mouth/Throat:     Mouth: Mucous membranes are moist.  Eyes:     General: No scleral icterus.    Conjunctiva/sclera: Conjunctivae normal.  Cardiovascular:     Rate and Rhythm: Normal rate and regular rhythm.     Heart sounds: No murmur.     Comments: EKG -  NSR No LVH Normal EKG Pulmonary:     Effort: Pulmonary effort is normal.     Breath sounds: No stridor. No wheezing, rhonchi or rales.  Abdominal:     General: Abdomen is  flat. Bowel sounds are normal. There is no distension.     Palpations: Abdomen is soft. There is no hepatomegaly, splenomegaly or mass.     Tenderness: There is no abdominal tenderness.     Hernia: There is no hernia in the left inguinal area or right inguinal area.  Genitourinary:    Pubic Area: No rash.      Penis: Normal and uncircumcised. No phimosis, hypospadias, erythema, tenderness, discharge, swelling or lesions.      Testes: Normal.     Epididymis:     Right: Normal. Not enlarged. No mass.     Left: Normal. Not enlarged. No mass.      Prostate: Enlarged (1+ smooth symm BPH). Not tender and no nodules present.     Rectum: Normal. Guaiac result negative. No mass, tenderness, anal fissure, external hemorrhoid or internal hemorrhoid. Normal anal tone.  Musculoskeletal:     Cervical back: Normal range of motion.     Right lower leg: No edema.     Left lower leg: No edema.  Lymphadenopathy:     Cervical: No cervical adenopathy.     Lower Body: No right inguinal adenopathy. No left inguinal adenopathy.  Skin:    General: Skin is warm and dry.  Neurological:     General: No focal deficit present.     Mental Status: He is alert.  Psychiatric:        Mood and Affect: Mood normal.        Behavior: Behavior normal.     Lab Results  Component Value Date   WBC 6.2 07/12/2019   HGB 14.6 07/12/2019   HCT 43.6 07/12/2019   PLT 238.0 07/12/2019   GLUCOSE 106 (H) 07/12/2019   CHOL 101 07/12/2019   TRIG 141.0 07/12/2019   HDL 40.00 07/12/2019   LDLDIRECT 84.0 10/10/2015   LDLCALC 33 07/12/2019   ALT 19 07/12/2019   AST 22 07/12/2019   NA 138 07/12/2019   K 4.3 07/12/2019   CL 102 07/12/2019   CREATININE 1.42 07/12/2019   BUN 21 07/12/2019   CO2 30 07/12/2019   TSH 2.51 07/12/2019   PSA 1.29 07/12/2019   HGBA1C 7.0 (H) 07/12/2019   MICROALBUR 1.3 07/12/2019    DG Chest 2 View  Result Date: 03/08/2018 CLINICAL DATA:  Dry cough for 1 week EXAM: CHEST - 2 VIEW COMPARISON:  None. FINDINGS: Coarse interstitial opacity may reflect bronchitic changes. No consolidation or effusion. Normal heart size. No pneumothorax. IMPRESSION: 1. No focal pulmonary infiltrate. 2. Bronchitic changes. Electronically Signed   By: Donavan Foil M.D.   On: 03/08/2018 15:30    Assessment & Plan:   [name] was seen today for annual exam, hypertension, diabetes and hyperlipidemia.  Diagnoses and all orders for this visit:  Essential hypertension, benign- His blood pressure is adequately well controlled.  Electrolytes are normal.  He has had  a slight decline in his renal function. -     CBC with Differential/Platelet -     Basic metabolic panel -     TSH -     Urinalysis, Routine w reflex microscopic  Diabetic polyneuropathy associated with type 2 diabetes mellitus ([place])- His symptoms are well controlled with gabapentin.  Type II diabetes mellitus with manifestations ([place])- His A1c is at 7.0%.  Will continue the current combination of Metformin and an SGLT2 inhibitor. -     Basic metabolic panel -     Microalbumin / creatinine urine ratio -     Hemoglobin  A1c -     HM Diabetes Foot Exam -     canagliflozin (INVOKANA) 100 MG TABS tablet; Take 1 tablet (100 mg total) by mouth daily before breakfast.  Benign prostatic hyperplasia without lower urinary tract symptoms- His PSA is normal which is a reassuring sign that he does not have prostate cancer.  He has no symptoms that need to be treated. -     PSA  Hyperlipidemia with target LDL less than 70- He has achieved his LDL goal and is doing well on the statin. -     Lipid panel -     Hepatic function panel  Pure hyperglyceridemia- His triglycerides are normal now. -     Lipid panel -     Hepatic function panel  Routine general medical examination at a health care facility- Exam completed, labs reviewed, vaccines reviewed and updated, colon cancer screening is up-to-date, patient education material was given.  Stage 3a chronic kidney disease- He has had a slight decline in his renal function.  His urinalysis is bland I will continue to maintain good control of his blood pressure and blood sugar.  I have asked him to avoid nephrotoxic agents.   I have discontinued Dahlton Upshur's atorvastatin. I have also changed his Invokana to canagliflozin. Additionally, I am having him maintain his aspirin EC, gabapentin, candesartan, and metFORMIN.  Meds ordered this encounter  Medications  . canagliflozin (INVOKANA) 100 MG TABS tablet    Sig: Take 1 tablet (100 mg total) by mouth daily  before breakfast.    Dispense:  90 tablet    Refill:  1     Follow-up: Return in about 6 months (around 01/12/2020).  Sanda Linger, MD

## 2019-07-13 ENCOUNTER — Ambulatory Visit: Payer: BC Managed Care – PPO | Attending: Internal Medicine

## 2019-07-13 ENCOUNTER — Encounter: Payer: Self-pay | Admitting: Internal Medicine

## 2019-07-13 DIAGNOSIS — Z23 Encounter for immunization: Secondary | ICD-10-CM

## 2019-07-13 NOTE — Progress Notes (Signed)
   Covid-19 Vaccination Clinic  Name:  Michael Ashley    MRN: [id number] DOB: 07-13-1950  07/13/2019  Michael Ashley was observed post Covid-19 immunization for 15 minutes without incident. He was provided with Vaccine Information Sheet and instruction to access the V-Safe system.   Michael Ashley was instructed to call 911 with any severe reactions post vaccine: Marland Kitchen Difficulty breathing  . Swelling of face and throat  . A fast heartbeat  . A bad rash all over body  . Dizziness and weakness   Immunizations Administered    Name Date Dose VIS Date Route   Pfizer COVID-19 Vaccine 07/13/2019 11:46 AM 0.3 mL 04/14/2019 Intramuscular   Manufacturer: [employer], [employer]   Lot: [id number]   NDC: [address]-9

## 2019-07-14 MED ORDER — CANAGLIFLOZIN 100 MG PO TABS: 100.0000 mg | ORAL_TABLET | Freq: Every day | ORAL | 1 refills | Status: DC

## 2019-08-07 ENCOUNTER — Ambulatory Visit: Payer: BC Managed Care – PPO | Attending: Internal Medicine

## 2019-08-07 DIAGNOSIS — Z23 Encounter for immunization: Secondary | ICD-10-CM

## 2019-08-07 NOTE — Progress Notes (Signed)
   Covid-19 Vaccination Clinic  Name:  Jadarius Commons    MRN: [id number] DOB: Aug 08, 1950  08/07/2019  Mr. Konkel was observed post Covid-19 immunization for 15 minutes without incident. He was provided with Vaccine Information Sheet and instruction to access the V-Safe system.   Mr. Deneault was instructed to call 911 with any severe reactions post vaccine: Marland Kitchen Difficulty breathing  . Swelling of face and throat  . A fast heartbeat  . A bad rash all over body  . Dizziness and weakness   Immunizations Administered    Name Date Dose VIS Date Route   Pfizer COVID-19 Vaccine 08/07/2019  1:46 PM 0.3 mL 04/14/2019 Intramuscular   Manufacturer: [employer], [employer]   Lot: [id number]   NDC: [address]-2

## 2019-08-20 ENCOUNTER — Other Ambulatory Visit: Payer: Self-pay | Admitting: Internal Medicine

## 2019-08-20 DIAGNOSIS — E118 Type 2 diabetes mellitus with unspecified complications: Secondary | ICD-10-CM

## 2019-08-29 ENCOUNTER — Other Ambulatory Visit: Payer: Self-pay | Admitting: Internal Medicine

## 2019-08-29 DIAGNOSIS — E118 Type 2 diabetes mellitus with unspecified complications: Secondary | ICD-10-CM

## 2019-08-29 DIAGNOSIS — E785 Hyperlipidemia, unspecified: Secondary | ICD-10-CM

## 2019-09-17 ENCOUNTER — Other Ambulatory Visit: Payer: Self-pay | Admitting: Internal Medicine

## 2019-09-17 DIAGNOSIS — I10 Essential (primary) hypertension: Secondary | ICD-10-CM

## 2019-09-17 DIAGNOSIS — E1342 Other specified diabetes mellitus with diabetic polyneuropathy: Secondary | ICD-10-CM

## 2019-09-17 DIAGNOSIS — E118 Type 2 diabetes mellitus with unspecified complications: Secondary | ICD-10-CM

## 2020-02-07 ENCOUNTER — Other Ambulatory Visit: Payer: Self-pay | Admitting: Internal Medicine

## 2020-02-07 DIAGNOSIS — E118 Type 2 diabetes mellitus with unspecified complications: Secondary | ICD-10-CM

## 2020-03-10 ENCOUNTER — Other Ambulatory Visit: Payer: Self-pay | Admitting: Internal Medicine

## 2020-03-10 DIAGNOSIS — E118 Type 2 diabetes mellitus with unspecified complications: Secondary | ICD-10-CM

## 2020-03-10 DIAGNOSIS — E785 Hyperlipidemia, unspecified: Secondary | ICD-10-CM

## 2020-03-16 ENCOUNTER — Other Ambulatory Visit: Payer: Self-pay | Admitting: Internal Medicine

## 2020-03-16 DIAGNOSIS — E118 Type 2 diabetes mellitus with unspecified complications: Secondary | ICD-10-CM

## 2020-03-16 DIAGNOSIS — I10 Essential (primary) hypertension: Secondary | ICD-10-CM

## 2020-03-25 ENCOUNTER — Other Ambulatory Visit: Payer: Self-pay | Admitting: Internal Medicine

## 2020-03-25 DIAGNOSIS — E1342 Other specified diabetes mellitus with diabetic polyneuropathy: Secondary | ICD-10-CM

## 2020-05-30 ENCOUNTER — Other Ambulatory Visit: Payer: Self-pay

## 2020-05-30 ENCOUNTER — Encounter: Payer: Self-pay | Admitting: Internal Medicine

## 2020-05-30 ENCOUNTER — Ambulatory Visit: Payer: BC Managed Care – PPO | Admitting: Internal Medicine

## 2020-05-30 VITALS — BP 118/72 | HR 96 | Temp 98.1°F | Resp 16 | Ht 69.5 in | Wt 215.0 lb

## 2020-05-30 DIAGNOSIS — E118 Type 2 diabetes mellitus with unspecified complications: Secondary | ICD-10-CM | POA: Diagnosis not present

## 2020-05-30 DIAGNOSIS — Z1211 Encounter for screening for malignant neoplasm of colon: Secondary | ICD-10-CM

## 2020-05-30 DIAGNOSIS — N1831 Chronic kidney disease, stage 3a: Secondary | ICD-10-CM

## 2020-05-30 DIAGNOSIS — Z23 Encounter for immunization: Secondary | ICD-10-CM

## 2020-05-30 DIAGNOSIS — I10 Essential (primary) hypertension: Secondary | ICD-10-CM

## 2020-05-30 LAB — BASIC METABOLIC PANEL
BUN: 26 mg/dL — ABNORMAL HIGH (ref 6–23)
CO2: 26 mEq/L (ref 19–32)
Calcium: 9.3 mg/dL (ref 8.4–10.5)
Chloride: 102 mEq/L (ref 96–112)
Creatinine, Ser: 1.82 mg/dL — ABNORMAL HIGH (ref 0.40–1.50)
GFR: 36.91 mL/min — ABNORMAL LOW (ref >60.00)
Glucose, Bld: 196 mg/dL — ABNORMAL HIGH (ref 70–99)
Potassium: 4.1 mEq/L (ref 3.5–5.1)
Sodium: 136 mEq/L (ref 135–145)

## 2020-05-30 LAB — URINALYSIS, ROUTINE W REFLEX MICROSCOPIC
Bilirubin Urine: NEGATIVE
Hgb urine dipstick: NEGATIVE
Ketones, ur: NEGATIVE
Leukocytes,Ua: NEGATIVE
Nitrite: NEGATIVE
RBC / HPF: NONE SEEN (ref 0–?)
Specific Gravity, Urine: 1.015 (ref 1.000–1.030)
Total Protein, Urine: NEGATIVE
Urine Glucose: >=1000 — AB
Urobilinogen, UA: 0.2 (ref 0.0–1.0)
pH: 5 (ref 5.0–8.0)

## 2020-05-30 LAB — MICROALBUMIN / CREATININE URINE RATIO
Creatinine,U: 80.4 mg/dL
Microalb Creat Ratio: 0.9 mg/g (ref 0.0–30.0)
Microalb, Ur: <0.7 mg/dL (ref 0.0–1.9)

## 2020-05-30 LAB — HEMOGLOBIN A1C: Hgb A1c MFr Bld: 7 % — ABNORMAL HIGH (ref 4.6–6.5)

## 2020-05-30 NOTE — Progress Notes (Signed)
Subjective:  Patient ID: Michael Ashley, male    DOB: November 27, 1950  Age: 70 y.o. MRN: [id number]  CC: Hypertension and Diabetes  This visit occurred during the SARS-CoV-2 public health emergency.  Safety protocols were in place, including screening questions prior to the visit, additional usage of staff PPE, and extensive cleaning of exam room while observing appropriate contact time as indicated for disinfecting solutions.    HPI Michael Ashley presents for f/up - He continues to intermittently take ibuprofen.  He tells me his blood pressure and blood sugar have been adequately well controlled.  He is active and denies any recent episodes of chest pain, shortness of breath, palpitations, edema, or fatigue.  Outpatient Medications Prior to Visit  Medication Sig Dispense Refill  . aspirin EC 81 MG tablet Take 1 tablet (81 mg total) by mouth daily. 90 tablet 3  . atorvastatin (LIPITOR) 20 MG tablet Take 1 tablet by mouth once daily 90 tablet 1  . candesartan (ATACAND) 32 MG tablet Take 1 tablet by mouth once daily 90 tablet 0  . dapagliflozin propanediol (FARXIGA) 10 MG TABS tablet Take 1 tablet (10 mg total) by mouth daily. 90 tablet 1  . gabapentin (NEURONTIN) 300 MG capsule TAKE 1 CAPSULE BY MOUTH THREE TIMES DAILY 90 capsule 5  . metFORMIN (GLUCOPHAGE) 1000 MG tablet TAKE 1 TABLET BY MOUTH TWICE DAILY WITH  A  MEAL 180 tablet 1   No facility-administered medications prior to visit.    ROS Review of Systems  Constitutional: Negative for chills, diaphoresis, fatigue and fever.  HENT: Negative.   Eyes: Negative for visual disturbance.  Respiratory: Negative for cough, chest tightness, shortness of breath and wheezing.   Cardiovascular: Negative for chest pain, palpitations and leg swelling.  Gastrointestinal: Negative for abdominal pain, constipation, diarrhea, nausea and vomiting.  Endocrine: Negative.   Genitourinary: Negative.  Negative for difficulty urinating.  Musculoskeletal: Positive  for back pain. Negative for myalgias.  Skin: Negative.  Negative for color change and pallor.  Neurological: Negative.  Negative for dizziness, weakness, light-headedness and numbness.  Hematological: Negative for adenopathy. Does not bruise/bleed easily.  Psychiatric/Behavioral: Negative.     Objective:  BP 118/72   Pulse 96   Temp 98.1 F (36.7 C) (Oral)   Resp 16   Ht 5' 9.5" (1.765 m)   Wt 215 lb (97.5 kg)   SpO2 98%   BMI 31.29 kg/m   BP Readings from Last 3 Encounters:  05/30/20 118/72  07/12/19 114/78  11/08/18 138/84    Wt Readings from Last 3 Encounters:  05/30/20 215 lb (97.5 kg)  07/12/19 218 lb (98.9 kg)  11/08/18 216 lb 12 oz (98.3 kg)    Physical Exam Vitals reviewed.  HENT:     Nose: Nose normal.     Mouth/Throat:     Mouth: Mucous membranes are moist.  Eyes:     General: No scleral icterus.    Conjunctiva/sclera: Conjunctivae normal.  Cardiovascular:     Rate and Rhythm: Normal rate and regular rhythm.     Heart sounds: No murmur heard.   Pulmonary:     Effort: Pulmonary effort is normal.     Breath sounds: No stridor. No wheezing, rhonchi or rales.  Abdominal:     General: Abdomen is flat.     Palpations: There is no mass.     Tenderness: There is no abdominal tenderness. There is no guarding.  Musculoskeletal:        General: Normal range of motion.  Cervical back: Neck supple.     Right lower leg: No edema.     Left lower leg: No edema.  Lymphadenopathy:     Cervical: No cervical adenopathy.  Skin:    General: Skin is warm and dry.  Neurological:     General: No focal deficit present.     Mental Status: He is alert.  Psychiatric:        Mood and Affect: Mood normal.        Behavior: Behavior normal.     Lab Results  Component Value Date   WBC 6.2 07/12/2019   HGB 14.6 07/12/2019   HCT 43.6 07/12/2019   PLT 238.0 07/12/2019   GLUCOSE 196 (H) 05/30/2020   CHOL 101 07/12/2019   TRIG 141.0 07/12/2019   HDL 40.00  07/12/2019   LDLDIRECT 84.0 10/10/2015   LDLCALC 33 07/12/2019   ALT 19 07/12/2019   AST 22 07/12/2019   NA 136 05/30/2020   K 4.1 05/30/2020   CL 102 05/30/2020   CREATININE 1.82 (H) 05/30/2020   BUN 26 (H) 05/30/2020   CO2 26 05/30/2020   TSH 2.51 07/12/2019   PSA 1.29 07/12/2019   HGBA1C 7.0 (H) 05/30/2020   MICROALBUR <0.7 05/30/2020    DG Chest 2 View  Result Date: 03/08/2018 CLINICAL DATA:  Dry cough for 1 week EXAM: CHEST - 2 VIEW COMPARISON:  None. FINDINGS: Coarse interstitial opacity may reflect bronchitic changes. No consolidation or effusion. Normal heart size. No pneumothorax. IMPRESSION: 1. No focal pulmonary infiltrate. 2. Bronchitic changes. Electronically Signed   By: Jasmine Pang M.D.   On: 03/08/2018 15:30    Assessment & Plan:   Michael Ashley was seen today for hypertension and diabetes.  Diagnoses and all orders for this visit:  Flu vaccine need -     Flu Vaccine QUAD High Dose(Fluad)  Colon cancer screening -     Cologuard  Essential hypertension, benign- His blood pressure is adequately well controlled. -     Basic metabolic panel; Future -     Urinalysis, Routine w reflex microscopic; Future -     Urinalysis, Routine w reflex microscopic -     Basic metabolic panel  Type II diabetes mellitus with manifestations (HCC)- His blood sugars are adequately well controlled. -     Basic metabolic panel; Future -     Microalbumin / creatinine urine ratio; Future -     Hemoglobin A1c; Future -     Hemoglobin A1c -     Microalbumin / creatinine urine ratio -     Basic metabolic panel  Stage 3a chronic kidney disease (HCC)- His renal function has declined some.  I encouraged him to stop taking anti-inflammatories and to stop smoking. -     Basic metabolic panel; Future -     Microalbumin / creatinine urine ratio; Future -     Urinalysis, Routine w reflex microscopic; Future -     Urinalysis, Routine w reflex microscopic -     Microalbumin / creatinine urine  ratio -     Basic metabolic panel   I am having Michael Ashley maintain his aspirin EC, metFORMIN, dapagliflozin propanediol, atorvastatin, candesartan, and gabapentin.  No orders of the defined types were placed in this encounter.    Follow-up: Return in about 6 months (around 11/27/2020).  Sanda Linger, MD

## 2020-05-30 NOTE — Patient Instructions (Signed)
Type 2 Diabetes Mellitus, Diagnosis, Adult Type 2 diabetes (type 2 diabetes mellitus) is a long-term, or chronic, disease. In type 2 diabetes, one or both of these problems may be present:  The pancreas does not make enough of a hormone called insulin.  Cells in the body do not respond properly to insulin that the body makes (insulin resistance). Normally, insulin allows blood sugar (glucose) to enter cells in the body. The cells use glucose for energy. Insulin resistance or lack of insulin causes excess glucose to build up in the blood instead of going into cells. This causes high blood glucose (hyperglycemia).  What are the causes? The exact cause of type 2 diabetes is not known. What increases the risk? The following factors may make you more likely to develop this condition:  Having a family member with type 2 diabetes.  Being overweight or obese.  Being inactive (sedentary).  Having been diagnosed with insulin resistance.  Having a history of prediabetes, diabetes when you were pregnant (gestational diabetes), or polycystic ovary syndrome (PCOS). What are the signs or symptoms? In the early stage of this condition, you may not have symptoms. Symptoms develop slowly and may include:  Increased thirst or hunger.  Increased urination.  Unexplained weight loss.  Tiredness (fatigue) or weakness.  Vision changes, such as blurry vision.  Dark patches on the skin. How is this diagnosed? This condition is diagnosed based on your symptoms, your medical history, a physical exam, and your blood glucose level. Your blood glucose may be checked with one or more of the following blood tests:  A fasting blood glucose (FBG) test. You will not be allowed to eat (you will fast) for 8 hours or longer before a blood sample is taken.  A random blood glucose test. This test checks blood glucose at any time of day regardless of when you ate.  An A1C (hemoglobin A1C) blood test. This test  provides information about blood glucose levels over the previous 2-3 months.  An oral glucose tolerance test (OGTT). This test measures your blood glucose at two times: ? After fasting. This is your baseline blood glucose level. ? Two hours after drinking a beverage that contains glucose. You may be diagnosed with type 2 diabetes if:  Your fasting blood glucose level is 126 mg/dL (7.0 mmol/L) or higher.  Your random blood glucose level is 200 mg/dL (11.1 mmol/L) or higher.  Your A1C level is 6.5% or higher.  Your oral glucose tolerance test result is higher than 200 mg/dL (11.1 mmol/L). These blood tests may be repeated to confirm your diagnosis.   How is this treated? Your treatment may be managed by a specialist called an endocrinologist. Type 2 diabetes may be treated by following instructions from your health care provider about:  Making dietary and lifestyle changes. These may include: ? Following a personalized nutrition plan that is developed by a registered dietitian. ? Exercising regularly. ? Finding ways to manage stress.  Checking your blood glucose level as often as told.  Taking diabetes medicines or insulin daily. This helps to keep your blood glucose levels in the healthy range.  Taking medicines to help prevent complications from diabetes. Medicines may include: ? Aspirin. ? Medicine to lower cholesterol. ? Medicine to control blood pressure. Your health care provider will set treatment goals for you. Your goals will be based on your age, other medical conditions you have, and how you respond to diabetes treatment. Generally, the goal of treatment is to maintain the   following blood glucose levels:  Before meals: 80-130 mg/dL (4.4-7.2 mmol/L).  After meals: below 180 mg/dL (10 mmol/L).  A1C level: less than 7%. Follow these instructions at home: Questions to ask your health care provider Consider asking the following questions:  Should I meet with a certified  diabetes care and education specialist?  What diabetes medicines do I need, and when should I take them?  What equipment will I need to manage my diabetes at home?  How often do I need to check my blood glucose?  Where can I find a support group for people with diabetes?  What number can I call if I have questions?  When is my next appointment? General instructions  Take over-the-counter and prescription medicines only as told by your health care provider.  Keep all follow-up visits as told by your health care provider. This is important. Where to find more information  American Diabetes Association (ADA): www.diabetes.org  American Association of Diabetes Care and Education Specialists (ADCES): www.diabeteseducator.org  International Diabetes Federation (IDF): www.idf.org Contact a health care provider if:  Your blood glucose is at or above 240 mg/dL (13.3 mmol/L) for 2 days in a row.  You have been sick or have had a fever for 2 days or longer, and you are not getting better.  You have any of the following problems for more than 6 hours: ? You cannot eat or drink. ? You have nausea and vomiting. ? You have diarrhea. Get help right away if:  You have severe hypoglycemia. This means your blood glucose is lower than 54 mg/dL (3.0 mmol/L).  You become confused or you have trouble thinking clearly.  You have difficulty breathing.  You have moderate or large ketone levels in your urine. These symptoms may represent a serious problem that is an emergency. Do not wait to see if the symptoms will go away. Get medical help right away. Call your local emergency services (911 in the [place] Do not drive yourself to the hospital. Summary  Type 2 diabetes (type 2 diabetes mellitus) is a long-term, or chronic, disease. In type 2 diabetes, the pancreas does not make enough of a hormone called insulin, or cells in the body do not respond properly to insulin that the body makes (insulin  resistance).  This condition is treated by making dietary and lifestyle changes and taking diabetes medicines or insulin.  Your health care provider will set treatment goals for you. Your goals will be based on your age, other medical conditions you have, and how you respond to diabetes treatment.  Keep all follow-up visits as told by your health care provider. This is important. This information is not intended to replace advice given to you by your health care provider. Make sure you discuss any questions you have with your health care provider. Document Revised: 11/15/2019 Document Reviewed: 11/15/2019 Elsevier Patient Education  2021 [employer]vier Inc.  

## 2020-06-07 ENCOUNTER — Telehealth: Payer: Self-pay | Admitting: Internal Medicine

## 2020-06-07 DIAGNOSIS — I10 Essential (primary) hypertension: Secondary | ICD-10-CM

## 2020-06-07 DIAGNOSIS — E118 Type 2 diabetes mellitus with unspecified complications: Secondary | ICD-10-CM

## 2020-06-07 NOTE — Telephone Encounter (Signed)
Patient called and said that this medication is now $130. He was wondering if something else could be called in. Please advise. Please call the patient [phone]

## 2020-06-12 MED ORDER — CANDESARTAN CILEXETIL 32 MG PO TABS: 32.0000 mg | ORAL_TABLET | Freq: Every day | ORAL | 1 refills | Status: DC

## 2020-06-12 NOTE — Telephone Encounter (Signed)
Patient called and said that he found another pharmacy that would be much cheaper for candesartan (ATACAND) 32 MG tablet. He said it can be sent to [facility] [address] [place], [place] [address]. Please advise

## 2020-06-12 NOTE — Telephone Encounter (Signed)
Pt notified that medication has been refilled to the pharmacy he requested.

## 2020-06-12 NOTE — Addendum Note (Signed)
Addended by: Claudette Laws D on: 06/12/2020 11:59 AM   Modules accepted: Orders

## 2020-07-15 ENCOUNTER — Telehealth: Payer: Self-pay | Admitting: Internal Medicine

## 2020-07-15 ENCOUNTER — Other Ambulatory Visit: Payer: Self-pay | Admitting: Internal Medicine

## 2020-07-15 DIAGNOSIS — E1342 Other specified diabetes mellitus with diabetic polyneuropathy: Secondary | ICD-10-CM

## 2020-07-15 MED ORDER — GABAPENTIN 300 MG PO CAPS: 300.0000 mg | ORAL_CAPSULE | Freq: Three times a day (TID) | ORAL | 5 refills | Status: DC

## 2020-07-15 NOTE — Telephone Encounter (Signed)
1.Medication Requested: gabapentin (NEURONTIN) 300 MG capsule    2. Pharmacy (Name, Street, [place]): [id number] - [place], [place] - [address]  3. On Med List: yes   4. Last Visit with PCP: 1.27.22  5. Next visit date with PCP: n/a   Patient was wondering if the medication could be sent in by tomorrow. Please advise    Agent: Please be advised that RX refills may take up to 3 business days. We ask that you follow-up with your pharmacy.

## 2020-07-31 ENCOUNTER — Other Ambulatory Visit: Payer: Self-pay | Admitting: Internal Medicine

## 2020-08-06 DIAGNOSIS — Z1211 Encounter for screening for malignant neoplasm of colon: Secondary | ICD-10-CM | POA: Diagnosis not present

## 2020-08-10 LAB — COLOGUARD
COLOGUARD: NEGATIVE
Cologuard: NEGATIVE

## 2020-08-10 LAB — EXTERNAL GENERIC LAB PROCEDURE: COLOGUARD: NEGATIVE

## 2020-08-18 ENCOUNTER — Other Ambulatory Visit: Payer: Self-pay | Admitting: Internal Medicine

## 2020-08-18 DIAGNOSIS — E118 Type 2 diabetes mellitus with unspecified complications: Secondary | ICD-10-CM

## 2020-09-04 ENCOUNTER — Other Ambulatory Visit: Payer: Self-pay | Admitting: Internal Medicine

## 2020-09-04 DIAGNOSIS — E118 Type 2 diabetes mellitus with unspecified complications: Secondary | ICD-10-CM

## 2020-09-04 DIAGNOSIS — E785 Hyperlipidemia, unspecified: Secondary | ICD-10-CM

## 2020-09-25 ENCOUNTER — Other Ambulatory Visit: Payer: Self-pay | Admitting: Internal Medicine

## 2020-09-25 DIAGNOSIS — E1342 Other specified diabetes mellitus with diabetic polyneuropathy: Secondary | ICD-10-CM

## 2020-12-16 ENCOUNTER — Other Ambulatory Visit: Payer: Self-pay | Admitting: Internal Medicine

## 2020-12-16 ENCOUNTER — Telehealth: Payer: Self-pay | Admitting: Internal Medicine

## 2020-12-16 DIAGNOSIS — E118 Type 2 diabetes mellitus with unspecified complications: Secondary | ICD-10-CM

## 2020-12-16 DIAGNOSIS — I10 Essential (primary) hypertension: Secondary | ICD-10-CM

## 2020-12-16 NOTE — Telephone Encounter (Signed)
Pt scheduled for 8/17 @ 11.20am

## 2020-12-16 NOTE — Telephone Encounter (Signed)
1.Medication Requested: candesartan (ATACAND) 32 MG tablet  2. Pharmacy (Name, Street, [place]): [facility] [address] [place], [place] - [address] AT [address] [address] [address]  Phone:  [phone] Fax:  [phone]   3. On Med List: yes  4. Last Visit with PCP: 01.27.22  5. Next visit date with PCP: n/a  Agent: Please be advised that RX refills may take up to 3 business days. We ask that you follow-up with your pharmacy.

## 2020-12-17 ENCOUNTER — Other Ambulatory Visit: Payer: Self-pay | Admitting: Internal Medicine

## 2020-12-17 DIAGNOSIS — E785 Hyperlipidemia, unspecified: Secondary | ICD-10-CM

## 2020-12-17 DIAGNOSIS — E118 Type 2 diabetes mellitus with unspecified complications: Secondary | ICD-10-CM

## 2020-12-18 ENCOUNTER — Other Ambulatory Visit: Payer: Self-pay

## 2020-12-18 ENCOUNTER — Encounter: Payer: Self-pay | Admitting: Internal Medicine

## 2020-12-18 ENCOUNTER — Ambulatory Visit: Payer: BC Managed Care – PPO | Admitting: Internal Medicine

## 2020-12-18 VITALS — Resp 16 | Ht 69.5 in

## 2020-12-18 DIAGNOSIS — Z0001 Encounter for general adult medical examination with abnormal findings: Secondary | ICD-10-CM | POA: Diagnosis not present

## 2020-12-18 DIAGNOSIS — L89151 Pressure ulcer of sacral region, stage 1: Secondary | ICD-10-CM | POA: Insufficient documentation

## 2020-12-18 DIAGNOSIS — E781 Pure hyperglyceridemia: Secondary | ICD-10-CM | POA: Diagnosis not present

## 2020-12-18 DIAGNOSIS — E1342 Other specified diabetes mellitus with diabetic polyneuropathy: Secondary | ICD-10-CM

## 2020-12-18 DIAGNOSIS — N1831 Chronic kidney disease, stage 3a: Secondary | ICD-10-CM | POA: Diagnosis not present

## 2020-12-18 DIAGNOSIS — E118 Type 2 diabetes mellitus with unspecified complications: Secondary | ICD-10-CM | POA: Diagnosis not present

## 2020-12-18 DIAGNOSIS — I10 Essential (primary) hypertension: Secondary | ICD-10-CM

## 2020-12-18 DIAGNOSIS — E1140 Type 2 diabetes mellitus with diabetic neuropathy, unspecified: Secondary | ICD-10-CM

## 2020-12-18 DIAGNOSIS — Z23 Encounter for immunization: Secondary | ICD-10-CM | POA: Insufficient documentation

## 2020-12-18 DIAGNOSIS — N400 Enlarged prostate without lower urinary tract symptoms: Secondary | ICD-10-CM | POA: Diagnosis not present

## 2020-12-18 DIAGNOSIS — E785 Hyperlipidemia, unspecified: Secondary | ICD-10-CM | POA: Diagnosis not present

## 2020-12-18 LAB — URINALYSIS, ROUTINE W REFLEX MICROSCOPIC
Bilirubin Urine: NEGATIVE
Hgb urine dipstick: NEGATIVE
Ketones, ur: NEGATIVE
Leukocytes,Ua: NEGATIVE
Nitrite: NEGATIVE
RBC / HPF: NONE SEEN (ref 0–?)
Specific Gravity, Urine: 1.02 (ref 1.000–1.030)
Total Protein, Urine: NEGATIVE
Urine Glucose: >=1000 — AB
Urobilinogen, UA: 0.2 (ref 0.0–1.0)
pH: 5.5 (ref 5.0–8.0)

## 2020-12-18 LAB — CBC WITH DIFFERENTIAL/PLATELET
Basophils Absolute: 0 10*3/uL (ref 0.0–0.1)
Basophils Relative: 0.7 % (ref 0.0–3.0)
Eosinophils Absolute: 0.4 10*3/uL (ref 0.0–0.7)
Eosinophils Relative: 6.7 % — ABNORMAL HIGH (ref 0.0–5.0)
HCT: 43.3 % (ref 39.0–52.0)
Hemoglobin: 14.5 g/dL (ref 13.0–17.0)
Lymphocytes Relative: 21 % (ref 12.0–46.0)
Lymphs Abs: 1.2 10*3/uL (ref 0.7–4.0)
MCHC: 33.4 g/dL (ref 30.0–36.0)
MCV: 93.2 fl (ref 78.0–100.0)
Monocytes Absolute: 0.5 10*3/uL (ref 0.1–1.0)
Monocytes Relative: 8.8 % (ref 3.0–12.0)
Neutro Abs: 3.4 10*3/uL (ref 1.4–7.7)
Neutrophils Relative %: 62.8 % (ref 43.0–77.0)
Platelets: 236 10*3/uL (ref 150.0–400.0)
RBC: 4.65 Mil/uL (ref 4.22–5.81)
RDW: 15.1 % (ref 11.5–15.5)
WBC: 5.5 10*3/uL (ref 4.0–10.5)

## 2020-12-18 LAB — HEPATIC FUNCTION PANEL
ALT: 17 U/L (ref 0–53)
AST: 18 U/L (ref 0–37)
Albumin: 4.5 g/dL (ref 3.5–5.2)
Alkaline Phosphatase: 76 U/L (ref 39–117)
Bilirubin, Direct: 0.2 mg/dL (ref 0.0–0.3)
Total Bilirubin: 0.7 mg/dL (ref 0.2–1.2)
Total Protein: 7.7 g/dL (ref 6.0–8.3)

## 2020-12-18 LAB — BASIC METABOLIC PANEL
BUN: 26 mg/dL — ABNORMAL HIGH (ref 6–23)
CO2: 26 mEq/L (ref 19–32)
Calcium: 9.8 mg/dL (ref 8.4–10.5)
Chloride: 101 mEq/L (ref 96–112)
Creatinine, Ser: 1.39 mg/dL (ref 0.40–1.50)
GFR: 50.8 mL/min — ABNORMAL LOW (ref >60.00)
Glucose, Bld: 99 mg/dL (ref 70–99)
Potassium: 5 mEq/L (ref 3.5–5.1)
Sodium: 136 mEq/L (ref 135–145)

## 2020-12-18 LAB — LIPID PANEL
Cholesterol: 101 mg/dL (ref 0–200)
HDL: 41 mg/dL (ref >39.00)
LDL Cholesterol: 41 mg/dL (ref 0–99)
NonHDL: 60.08
Total CHOL/HDL Ratio: 2
Triglycerides: 94 mg/dL (ref 0.0–149.0)
VLDL: 18.8 mg/dL (ref 0.0–40.0)

## 2020-12-18 LAB — PSA: PSA: 1.11 ng/mL (ref 0.10–4.00)

## 2020-12-18 LAB — TSH: TSH: 1.43 u[IU]/mL (ref 0.35–5.50)

## 2020-12-18 LAB — HEMOGLOBIN A1C: Hgb A1c MFr Bld: 7.1 % — ABNORMAL HIGH (ref 4.6–6.5)

## 2020-12-18 MED ORDER — ATORVASTATIN CALCIUM 20 MG PO TABS: 20.0000 mg | ORAL_TABLET | Freq: Every day | ORAL | 1 refills | Status: DC

## 2020-12-18 MED ORDER — GABAPENTIN 300 MG PO CAPS: 300.0000 mg | ORAL_CAPSULE | Freq: Three times a day (TID) | ORAL | 1 refills | Status: DC

## 2020-12-18 MED ORDER — CANDESARTAN CILEXETIL 32 MG PO TABS: 32.0000 mg | ORAL_TABLET | Freq: Every day | ORAL | 1 refills | Status: DC

## 2020-12-18 MED ORDER — DAPAGLIFLOZIN PROPANEDIOL 10 MG PO TABS: 10.0000 mg | ORAL_TABLET | Freq: Every day | ORAL | 1 refills | Status: DC

## 2020-12-18 MED ORDER — METFORMIN HCL 1000 MG PO TABS
1000.0000 mg | ORAL_TABLET | Freq: Two times a day (BID) | ORAL | 1 refills | Status: DC
Start: 2020-12-18 — End: 2020-12-21

## 2020-12-18 MED ORDER — SHINGRIX 50 MCG/0.5ML IM SUSR: 0.5000 mL | Freq: Once | INTRAMUSCULAR | 1 refills | Status: AC

## 2020-12-18 MED ORDER — SHINGRIX 50 MCG/0.5ML IM SUSR: 0.5000 mL | Freq: Once | INTRAMUSCULAR | 1 refills | Status: DC

## 2020-12-18 NOTE — Progress Notes (Signed)
Subjective:  Patient ID: Michael Ashley, male    DOB: 07-Oct-1950  Age: 70 y.o. MRN: [id number]  CC: Annual Exam, Hyperlipidemia, Hypertension, and Diabetes  This visit occurred during the SARS-CoV-2 public health emergency.  Safety protocols were in place, including screening questions prior to the visit, additional usage of staff PPE, and extensive cleaning of exam room while observing appropriate contact time as indicated for disinfecting solutions.    HPI [place] presents for a CPX and f/up -   He is active and denies any recent episodes of chest pain, shortness of breath, diaphoresis, edema, or palpitations.  Outpatient Medications Prior to Visit  Medication Sig Dispense Refill   aspirin EC 81 MG tablet Take 1 tablet (81 mg total) by mouth daily. 90 tablet 3   atorvastatin (LIPITOR) 20 MG tablet Take 1 tablet by mouth once daily 90 tablet 0   candesartan (ATACAND) 32 MG tablet Take 1 tablet (32 mg total) by mouth daily. 90 tablet 1   FARXIGA 10 MG TABS tablet Take 1 tablet by mouth once daily 90 tablet 1   gabapentin (NEURONTIN) 300 MG capsule TAKE 1 CAPSULE BY MOUTH THREE TIMES DAILY 90 capsule 3   metFORMIN (GLUCOPHAGE) 1000 MG tablet TAKE 1 TABLET BY MOUTH TWICE DAILY WITH A MEAL 180 tablet 0   No facility-administered medications prior to visit.    ROS Review of Systems  Constitutional:  Negative for diaphoresis, fatigue and fever.  HENT: Negative.    Eyes: Negative.   Respiratory:  Negative for cough, chest tightness, shortness of breath and wheezing.   Cardiovascular:  Negative for chest pain, palpitations and leg swelling.  Gastrointestinal:  Negative for abdominal pain.  Endocrine: Negative.   Genitourinary: Negative.  Negative for difficulty urinating.  Musculoskeletal:  Negative for arthralgias and myalgias.       He continues to complain of painful electrical shocks that extend through his feet and lower extremities  Skin:  Negative for color change and rash.   Neurological:  Negative for dizziness, weakness and light-headedness.  Hematological:  Negative for adenopathy. Does not bruise/bleed easily.  Psychiatric/Behavioral: Negative.     Objective:  BP (P) 116/76 (BP Location: Right Arm, Patient Position: Sitting, Cuff Size: Large)   Pulse (P) 78   Temp (P) 97.9 F (36.6 C) (Oral)   Resp 16   Ht 5' 9.5" (1.765 m)   Wt (P) 212 lb (96.2 kg)   SpO2 (P) 96%   BMI (P) 30.86 kg/m   BP Readings from Last 3 Encounters:  12/18/20 (P) 116/76  05/30/20 118/72  07/12/19 114/78    Wt Readings from Last 3 Encounters:  12/18/20 (P) 212 lb (96.2 kg)  05/30/20 215 lb (97.5 kg)  07/12/19 218 lb (98.9 kg)    Physical Exam Vitals reviewed.  HENT:     Nose: Nose normal.     Mouth/Throat:     Mouth: Mucous membranes are moist.  Eyes:     Conjunctiva/sclera: Conjunctivae normal.  Cardiovascular:     Rate and Rhythm: Normal rate and regular rhythm.     Heart sounds: Normal heart sounds, S1 normal and S2 normal.    No gallop.     Comments: EKG- NSR, 73 bpm No LVH  Normal EKG Pulmonary:     Breath sounds: No stridor. No wheezing, rhonchi or rales.  Abdominal:     General: Abdomen is flat.     Palpations: There is no mass.     Tenderness: There is no  abdominal tenderness. There is no guarding.     Hernia: No hernia is present. There is no hernia in the left inguinal area or right inguinal area.  Genitourinary:    Pubic Area: No rash.      Penis: Normal and uncircumcised.      Testes: Normal.     Epididymis:     Right: Normal.     Left: Normal.     Prostate: Enlarged. Not tender and no nodules present.     Rectum: Normal. Guaiac result negative. No mass, anal fissure, external hemorrhoid or internal hemorrhoid. Normal anal tone.     Comments: There is stage I breakdown in the sacral region. Musculoskeletal:     Cervical back: Neck supple.     Right lower leg: No edema.     Left lower leg: No edema.       Legs:  Lymphadenopathy:      Lower Body: No right inguinal adenopathy. No left inguinal adenopathy.  Skin:    General: Skin is warm and dry.     Findings: No rash.  Neurological:     General: No focal deficit present.     Mental Status: He is alert and oriented to person, place, and time. Mental status is at baseline.  Psychiatric:        Mood and Affect: Mood normal.        Behavior: Behavior normal.    Lab Results  Component Value Date   WBC 5.5 12/18/2020   HGB 14.5 12/18/2020   HCT 43.3 12/18/2020   PLT 236.0 12/18/2020   GLUCOSE 99 12/18/2020   CHOL 101 12/18/2020   TRIG 94.0 12/18/2020   HDL 41.00 12/18/2020   LDLDIRECT 84.0 10/10/2015   LDLCALC 41 12/18/2020   ALT 17 12/18/2020   AST 18 12/18/2020   NA 136 12/18/2020   K 5.0 12/18/2020   CL 101 12/18/2020   CREATININE 1.39 12/18/2020   BUN 26 (H) 12/18/2020   CO2 26 12/18/2020   TSH 1.43 12/18/2020   PSA 1.11 12/18/2020   HGBA1C 7.1 (H) 12/18/2020   MICROALBUR <0.7 05/30/2020    DG Chest 2 View  Result Date: 03/08/2018 CLINICAL DATA:  Dry cough for 1 week EXAM: CHEST - 2 VIEW COMPARISON:  None. FINDINGS: Coarse interstitial opacity may reflect bronchitic changes. No consolidation or effusion. Normal heart size. No pneumothorax. IMPRESSION: 1. No focal pulmonary infiltrate. 2. Bronchitic changes. Electronically Signed   By: Jasmine Pang M.D.   On: 03/08/2018 15:30    Assessment & Plan:   Garey was seen today for annual exam, hyperlipidemia, hypertension and diabetes.  Diagnoses and all orders for this visit:  Essential hypertension, benign- His blood pressure is well controlled.  His EKG is reassuring. -     CBC with Differential/Platelet; Future -     Basic metabolic panel; Future -     Urinalysis, Routine w reflex microscopic; Future -     Hepatic function panel; Future -     EKG 12-Lead -     Hepatic function panel -     Urinalysis, Routine w reflex microscopic -     Basic metabolic panel -     CBC with  Differential/Platelet -     candesartan (ATACAND) 32 MG tablet; Take 1 tablet (32 mg total) by mouth daily.  Type II diabetes mellitus with manifestations (HCC)- His blood sugar is adequately well controlled. -     Basic metabolic panel; Future -  Hemoglobin A1c; Future -     HM Diabetes Foot Exam -     Ambulatory referral to Ophthalmology -     Hemoglobin A1c -     Basic metabolic panel -     candesartan (ATACAND) 32 MG tablet; Take 1 tablet (32 mg total) by mouth daily. -     dapagliflozin propanediol (FARXIGA) 10 MG TABS tablet; Take 1 tablet (10 mg total) by mouth daily. -     metFORMIN (GLUCOPHAGE) 1000 MG tablet; Take 1 tablet (1,000 mg total) by mouth 2 (two) times daily with a meal.  Painful diabetic neuropathy (HCC) -     gabapentin (NEURONTIN) 300 MG capsule; Take 1 capsule (300 mg total) by mouth 3 (three) times daily.  Benign prostatic hyperplasia without lower urinary tract symptoms- His PSA is reassuring. -     PSA; Future -     Urinalysis, Routine w reflex microscopic; Future -     Urinalysis, Routine w reflex microscopic -     PSA  Stage 3a chronic kidney disease (HCC)- His renal function has improved some.  Will continue the ARB and SGLT2 inhibitor. -     Basic metabolic panel; Future -     Urinalysis, Routine w reflex microscopic; Future -     Urinalysis, Routine w reflex microscopic -     Basic metabolic panel -     candesartan (ATACAND) 32 MG tablet; Take 1 tablet (32 mg total) by mouth daily.  Hyperlipidemia with target LDL less than 70- LDL goal achieved. Doing well on the statin  -     Lipid panel; Future -     TSH; Future -     Hepatic function panel; Future -     Hepatic function panel -     TSH -     Lipid panel -     atorvastatin (LIPITOR) 20 MG tablet; Take 1 tablet (20 mg total) by mouth daily.  Pure hyperglyceridemia -     Lipid panel; Future -     Lipid panel  Encounter for general adult medical examination with abnormal findings- Exam  completed, labs reviewed, vaccines reviewed and updated, cancer screenings are up-to-date, patient education was given.  Need for shingles vaccine -     Discontinue: Zoster Vaccine Adjuvanted [facility]) injection; Inject 0.5 mLs into the muscle once for 1 dose. -     Zoster Vaccine Adjuvanted [facility]) injection; Inject 0.5 mLs into the muscle once for 1 dose.  Decubitus ulcer of sacral region, stage 1 -     Ambulatory referral to Home Health  Type 2 diabetes mellitus with complication, without long-term current use of insulin (HCC) -     atorvastatin (LIPITOR) 20 MG tablet; Take 1 tablet (20 mg total) by mouth daily. -     metFORMIN (GLUCOPHAGE) 1000 MG tablet; Take 1 tablet (1,000 mg total) by mouth 2 (two) times daily with a meal.  Diabetic polyneuropathy associated with other specified diabetes mellitus (HCC) -     gabapentin (NEURONTIN) 300 MG capsule; Take 1 capsule (300 mg total) by mouth 3 (three) times daily.  I have discontinued Saulo Petitjean's aspirin EC. I have changed his [name] to dapagliflozin propanediol. I have also changed his atorvastatin, gabapentin, and metFORMIN. Additionally, I am having him maintain his candesartan.  Meds ordered this encounter  Medications   DISCONTD: Zoster Vaccine Adjuvanted [facility]) injection    Sig: Inject 0.5 mLs into the muscle once for 1 dose.    Dispense:  0.5 mL    Refill:  1   atorvastatin (LIPITOR) 20 MG tablet    Sig: Take 1 tablet (20 mg total) by mouth daily.    Dispense:  90 tablet    Refill:  1   candesartan (ATACAND) 32 MG tablet    Sig: Take 1 tablet (32 mg total) by mouth daily.    Dispense:  90 tablet    Refill:  1   dapagliflozin propanediol (FARXIGA) 10 MG TABS tablet    Sig: Take 1 tablet (10 mg total) by mouth daily.    Dispense:  90 tablet    Refill:  1   gabapentin (NEURONTIN) 300 MG capsule    Sig: Take 1 capsule (300 mg total) by mouth 3 (three) times daily.    Dispense:  270 capsule    Refill:  1    metFORMIN (GLUCOPHAGE) 1000 MG tablet    Sig: Take 1 tablet (1,000 mg total) by mouth 2 (two) times daily with a meal.    Dispense:  180 tablet    Refill:  1   Zoster Vaccine Adjuvanted [facility]) injection    Sig: Inject 0.5 mLs into the muscle once for 1 dose.    Dispense:  0.5 mL    Refill:  1     Follow-up: Return in about 6 months (around 06/20/2021).  Sanda Linger, MD

## 2020-12-18 NOTE — Telephone Encounter (Signed)
Patient calling to make sure that this medication specifically is sent to [facility]. All other med refill pharmacy locations can remain the same

## 2020-12-18 NOTE — Patient Instructions (Signed)
Health Maintenance, Male Adopting a healthy lifestyle and getting preventive care are important in promoting health and wellness. Ask your health care provider about: The right schedule for you to have regular tests and exams. Things you can do on your own to prevent diseases and keep yourself healthy. What should I know about diet, weight, and exercise? Eat a healthy diet  Eat a diet that includes plenty of vegetables, fruits, low-fat dairy products, and lean protein. Do not eat a lot of foods that are high in solid fats, added sugars, or sodium.  Maintain a healthy weight Body mass index (BMI) is a measurement that can be used to identify possible weight problems. It estimates body fat based on height and weight. Your health care provider can help determine your BMI and help you achieve or maintain ahealthy weight. Get regular exercise Get regular exercise. This is one of the most important things you can do for your health. Most adults should: Exercise for at least 150 minutes each week. The exercise should increase your heart rate and make you sweat (moderate-intensity exercise). Do strengthening exercises at least twice a week. This is in addition to the moderate-intensity exercise. Spend less time sitting. Even light physical activity can be beneficial. Watch cholesterol and blood lipids Have your blood tested for lipids and cholesterol at 72 years of age, then havethis test every 5 years. You may need to have your cholesterol levels checked more often if: Your lipid or cholesterol levels are high. You are older than 72 years of age. You are at high risk for heart disease. What should I know about cancer screening? Many types of cancers can be detected early and may often be prevented. Depending on your health history and family history, you may need to have cancer screening at various ages. This may include screening for: Colorectal cancer. Prostate cancer. Skin cancer. Lung  cancer. What should I know about heart disease, diabetes, and high blood pressure? Blood pressure and heart disease High blood pressure causes heart disease and increases the risk of stroke. This is more likely to develop in people who have high blood pressure readings, are of African descent, or are overweight. Talk with your health care provider about your target blood pressure readings. Have your blood pressure checked: Every 3-5 years if you are 18-39 years of age. Every year if you are 40 years old or older. If you are between the ages of 65 and 75 and are a current or former smoker, ask your health care provider if you should have a one-time screening for abdominal aortic aneurysm (AAA). Diabetes Have regular diabetes screenings. This checks your fasting blood sugar level. Have the screening done: Once every three years after age 45 if you are at a normal weight and have a low risk for diabetes. More often and at a younger age if you are overweight or have a high risk for diabetes. What should I know about preventing infection? Hepatitis B If you have a higher risk for hepatitis B, you should be screened for this virus. Talk with your health care provider to find out if you are at risk forhepatitis B infection. Hepatitis C Blood testing is recommended for: Everyone born from 1945 through 1965. Anyone with known risk factors for hepatitis C. Sexually transmitted infections (STIs) You should be screened each year for STIs, including gonorrhea and chlamydia, if: You are sexually active and are younger than 72 years of age. You are older than 72 years of age   and your health care provider tells you that you are at risk for this type of infection. Your sexual activity has changed since you were last screened, and you are at increased risk for chlamydia or gonorrhea. Ask your health care provider if you are at risk. Ask your health care provider about whether you are at high risk for HIV.  Your health care provider may recommend a prescription medicine to help prevent HIV infection. If you choose to take medicine to prevent HIV, you should first get tested for HIV. You should then be tested every 3 months for as long as you are taking the medicine. Follow these instructions at home: Lifestyle Do not use any products that contain nicotine or tobacco, such as cigarettes, e-cigarettes, and chewing tobacco. If you need help quitting, ask your health care provider. Do not use street drugs. Do not share needles. Ask your health care provider for help if you need support or information about quitting drugs. Alcohol use Do not drink alcohol if your health care provider tells you not to drink. If you drink alcohol: Limit how much you have to 0-2 drinks a day. Be aware of how much alcohol is in your drink. In the [place] one drink equals one 12 oz bottle of beer (355 mL), one 5 oz glass of wine (148 mL), or one 1 oz glass of hard liquor (44 mL). General instructions Schedule regular health, dental, and eye exams. Stay current with your vaccines. Tell your health care provider if: You often feel depressed. You have ever been abused or do not feel safe at home. Summary Adopting a healthy lifestyle and getting preventive care are important in promoting health and wellness. Follow your health care provider's instructions about healthy diet, exercising, and getting tested or screened for diseases. Follow your health care provider's instructions on monitoring your cholesterol and blood pressure. This information is not intended to replace advice given to you by your health care provider. Make sure you discuss any questions you have with your healthcare provider. Document Revised: 04/13/2018 Document Reviewed: 04/13/2018 Elsevier Patient Education  2022 Elsevier Inc.  

## 2020-12-21 ENCOUNTER — Other Ambulatory Visit: Payer: Self-pay | Admitting: Internal Medicine

## 2020-12-21 DIAGNOSIS — E118 Type 2 diabetes mellitus with unspecified complications: Secondary | ICD-10-CM

## 2020-12-27 ENCOUNTER — Other Ambulatory Visit: Payer: Self-pay | Admitting: Internal Medicine

## 2020-12-27 DIAGNOSIS — L89151 Pressure ulcer of sacral region, stage 1: Secondary | ICD-10-CM

## 2021-01-14 ENCOUNTER — Other Ambulatory Visit: Payer: Self-pay | Admitting: Internal Medicine

## 2021-01-14 DIAGNOSIS — E118 Type 2 diabetes mellitus with unspecified complications: Secondary | ICD-10-CM

## 2021-03-05 ENCOUNTER — Other Ambulatory Visit: Payer: Self-pay | Admitting: Internal Medicine

## 2021-03-05 DIAGNOSIS — E118 Type 2 diabetes mellitus with unspecified complications: Secondary | ICD-10-CM

## 2021-03-05 DIAGNOSIS — E785 Hyperlipidemia, unspecified: Secondary | ICD-10-CM

## 2021-06-01 ENCOUNTER — Other Ambulatory Visit: Payer: Self-pay | Admitting: Internal Medicine

## 2021-06-01 DIAGNOSIS — E785 Hyperlipidemia, unspecified: Secondary | ICD-10-CM

## 2021-06-01 DIAGNOSIS — E118 Type 2 diabetes mellitus with unspecified complications: Secondary | ICD-10-CM

## 2021-06-09 ENCOUNTER — Other Ambulatory Visit: Payer: Self-pay | Admitting: Internal Medicine

## 2021-06-09 DIAGNOSIS — E1342 Other specified diabetes mellitus with diabetic polyneuropathy: Secondary | ICD-10-CM

## 2021-06-09 DIAGNOSIS — E1140 Type 2 diabetes mellitus with diabetic neuropathy, unspecified: Secondary | ICD-10-CM

## 2021-07-10 ENCOUNTER — Other Ambulatory Visit: Payer: Self-pay | Admitting: Internal Medicine

## 2021-07-10 DIAGNOSIS — N1831 Chronic kidney disease, stage 3a: Secondary | ICD-10-CM

## 2021-07-10 DIAGNOSIS — I10 Essential (primary) hypertension: Secondary | ICD-10-CM

## 2021-07-10 DIAGNOSIS — E118 Type 2 diabetes mellitus with unspecified complications: Secondary | ICD-10-CM

## 2021-07-28 ENCOUNTER — Other Ambulatory Visit: Payer: Self-pay | Admitting: Internal Medicine

## 2021-07-28 DIAGNOSIS — E1140 Type 2 diabetes mellitus with diabetic neuropathy, unspecified: Secondary | ICD-10-CM

## 2021-07-28 DIAGNOSIS — E1342 Other specified diabetes mellitus with diabetic polyneuropathy: Secondary | ICD-10-CM

## 2021-08-06 ENCOUNTER — Encounter: Payer: Self-pay | Admitting: Internal Medicine

## 2021-08-06 ENCOUNTER — Ambulatory Visit (INDEPENDENT_AMBULATORY_CARE_PROVIDER_SITE_OTHER): Payer: Medicare HMO | Admitting: Internal Medicine

## 2021-08-06 VITALS — BP 130/82 | HR 79 | Temp 97.7°F | Ht 69.5 in | Wt 209.0 lb

## 2021-08-06 DIAGNOSIS — L989 Disorder of the skin and subcutaneous tissue, unspecified: Secondary | ICD-10-CM | POA: Insufficient documentation

## 2021-08-06 DIAGNOSIS — N1831 Chronic kidney disease, stage 3a: Secondary | ICD-10-CM | POA: Diagnosis not present

## 2021-08-06 DIAGNOSIS — E118 Type 2 diabetes mellitus with unspecified complications: Secondary | ICD-10-CM | POA: Diagnosis not present

## 2021-08-06 LAB — BASIC METABOLIC PANEL
BUN: 21 mg/dL (ref 6–23)
CO2: 31 mEq/L (ref 19–32)
Calcium: 10 mg/dL (ref 8.4–10.5)
Chloride: 102 mEq/L (ref 96–112)
Creatinine, Ser: 1.28 mg/dL (ref 0.40–1.50)
GFR: 55.84 mL/min — ABNORMAL LOW (ref >60.00)
Glucose, Bld: 120 mg/dL — ABNORMAL HIGH (ref 70–99)
Potassium: 4.5 mEq/L (ref 3.5–5.1)
Sodium: 138 mEq/L (ref 135–145)

## 2021-08-06 LAB — URINALYSIS, ROUTINE W REFLEX MICROSCOPIC
Bilirubin Urine: NEGATIVE
Hgb urine dipstick: NEGATIVE
Ketones, ur: NEGATIVE
Leukocytes,Ua: NEGATIVE
Nitrite: NEGATIVE
RBC / HPF: NONE SEEN (ref 0–?)
Specific Gravity, Urine: <=1.005 — AB (ref 1.000–1.030)
Total Protein, Urine: NEGATIVE
Urine Glucose: >=1000 — AB
Urobilinogen, UA: 0.2 (ref 0.0–1.0)
WBC, UA: NONE SEEN (ref 0–?)
pH: 5.5 (ref 5.0–8.0)

## 2021-08-06 LAB — MICROALBUMIN / CREATININE URINE RATIO
Creatinine,U: 35.9 mg/dL
Microalb Creat Ratio: 2 mg/g (ref 0.0–30.0)
Microalb, Ur: <0.7 mg/dL (ref 0.0–1.9)

## 2021-08-06 LAB — HEMOGLOBIN A1C: Hgb A1c MFr Bld: 7.3 % — ABNORMAL HIGH (ref 4.6–6.5)

## 2021-08-06 NOTE — Patient Instructions (Signed)
Type 2 Diabetes Mellitus, Diagnosis, Adult ?Type 2 diabetes (type 2 diabetes mellitus) is a long-term, or chronic, disease. In type 2 diabetes, one or both of these problems may be present: ?The pancreas does not make enough of a hormone called insulin. ?Cells in the body do not respond properly to the insulin that the body makes (insulin resistance). ?Normally, insulin allows blood sugar (glucose) to enter cells in the body. The cells use glucose for energy. Insulin resistance or lack of insulin causes excess glucose to build up in the blood instead of going into cells. This causes high blood glucose (hyperglycemia).  ?What are the causes? ?The exact cause of type 2 diabetes is not known. ?What increases the risk? ?The following factors may make you more likely to develop this condition: ?Having a family member with type 2 diabetes. ?Being overweight or obese. ?Being inactive (sedentary). ?Having been diagnosed with insulin resistance. ?Having a history of prediabetes, diabetes when you were pregnant (gestational diabetes), or polycystic ovary syndrome (PCOS). ?What are the signs or symptoms? ?In the early stage of this condition, you may not have symptoms. Symptoms develop slowly and may include: ?Increased thirst or hunger. ?Increased urination. ?Unexplained weight loss. ?Tiredness (fatigue) or weakness. ?Vision changes, such as blurry vision. ?Dark patches on the skin. ?How is this diagnosed? ?This condition is diagnosed based on your symptoms, your medical history, a physical exam, and your blood glucose level. Your blood glucose may be checked with one or more of the following blood tests: ?A fasting blood glucose (FBG) test. You will not be allowed to eat (you will fast) for 8 hours or longer before a blood sample is taken. ?A random blood glucose test. This test checks blood glucose at any time of day regardless of when you ate. ?An A1C (hemoglobin A1C) blood test. This test provides information about blood  glucose levels over the previous 2-3 months. ?An oral glucose tolerance test (OGTT). This test measures your blood glucose at two times: ?After fasting. This is your baseline blood glucose level. ?Two hours after drinking a beverage that contains glucose. ?You may be diagnosed with type 2 diabetes if: ?Your fasting blood glucose level is 126 mg/dL (7.0 mmol/L) or higher. ?Your random blood glucose level is 200 mg/dL (11.1 mmol/L) or higher. ?Your A1C level is 6.5% or higher. ?Your oral glucose tolerance test result is higher than 200 mg/dL (11.1 mmol/L). ?These blood tests may be repeated to confirm your diagnosis. ?How is this treated? ?Your treatment may be managed by a specialist called an endocrinologist. Type 2 diabetes may be treated by following instructions from your health care provider about: ?Making dietary and lifestyle changes. These may include: ?Following a personalized nutrition plan that is developed by a registered dietitian. ?Exercising regularly. ?Finding ways to manage stress. ?Checking your blood glucose level as often as told. ?Taking diabetes medicines or insulin daily. This helps to keep your blood glucose levels in the healthy range. ?Taking medicines to help prevent complications from diabetes. Medicines may include: ?Aspirin. ?Medicine to lower cholesterol. ?Medicine to control blood pressure. ?Your health care provider will set treatment goals for you. Your goals will be based on your age, other medical conditions you have, and how you respond to diabetes treatment. Generally, the goal of treatment is to maintain the following blood glucose levels: ?Before meals: 80-130 mg/dL (4.4-7.2 mmol/L). ?After meals: below 180 mg/dL (10 mmol/L). ?A1C level: less than 7%. ?Follow these instructions at home: ?Questions to ask your health care provider ?  Consider asking the following questions: ?Should I meet with a certified diabetes care and education specialist? ?What diabetes medicines do I need,  and when should I take them? ?What equipment will I need to manage my diabetes at home? ?How often do I need to check my blood glucose? ?Where can I find a support group for people with diabetes? ?What number can I call if I have questions? ?When is my next appointment? ?General instructions ?Take over-the-counter and prescription medicines only as told by your health care provider. ?Keep all follow-up visits. This is important. ?Where to find more information ?For help and guidance and for more information about diabetes, please visit: ?American Diabetes Association (ADA): www.diabetes.org ?American Association of Diabetes Care and Education Specialists (ADCES): www.diabeteseducator.org ?International Diabetes Federation (IDF): www.idf.org ?Contact a health care provider if: ?Your blood glucose is at or above 240 mg/dL (13.3 mmol/L) for 2 days in a row. ?You have been sick or have had a fever for 2 days or longer, and you are not getting better. ?You have any of the following problems for more than 6 hours: ?You cannot eat or drink. ?You have nausea and vomiting. ?You have diarrhea. ?Get help right away if: ?You have severe hypoglycemia. This means your blood glucose is lower than 54 mg/dL (3.0 mmol/L). ?You become confused or you have trouble thinking clearly. ?You have difficulty breathing. ?You have moderate or large ketone levels in your urine. ?These symptoms may represent a serious problem that is an emergency. Do not wait to see if the symptoms will go away. Get medical help right away. Call your local emergency services (911 in the [place] Do not drive yourself to the hospital. ?Summary ?Type 2 diabetes mellitus is a long-term, or chronic, disease. In type 2 diabetes, the pancreas does not make enough of a hormone called insulin, or cells in the body do not respond properly to insulin that the body makes. ?This condition is treated by making dietary and lifestyle changes and taking diabetes medicines or  insulin. ?Your health care provider will set treatment goals for you. Your goals will be based on your age, other medical conditions you have, and how you respond to diabetes treatment. ?Keep all follow-up visits. This is important. ?This information is not intended to replace advice given to you by your health care provider. Make sure you discuss any questions you have with your health care provider. ?Document Revised: 07/15/2020 Document Reviewed: 07/15/2020 ?Elsevier Patient Education ? 2022 Elsevier Inc. ? ?

## 2021-08-06 NOTE — Progress Notes (Signed)
? ? ? ? ?Subjective:  ?Patient ID: Michael Ashley, male    DOB: 1950/11/01  Age: 71 y.o. MRN: [id number] ? ?CC: Diabetes ? ? ?HPI ?Michael Ashley presents for f/up - ? ?He is active and denies chest pain, shortness of breath, diaphoresis, or edema. ? ?Outpatient Medications Prior to Visit  ?Medication Sig Dispense Refill  ? atorvastatin (LIPITOR) 20 MG tablet Take 1 tablet by mouth once daily 90 tablet 0  ? candesartan (ATACAND) 32 MG tablet Take 1 tablet by mouth once daily 90 tablet 0  ? FARXIGA 10 MG TABS tablet Take 1 tablet by mouth once daily 90 tablet 0  ? gabapentin (NEURONTIN) 300 MG capsule TAKE 1 CAPSULE BY MOUTH THREE TIMES DAILY 90 capsule 3  ? metFORMIN (GLUCOPHAGE) 1000 MG tablet TAKE 1 TABLET BY MOUTH TWICE DAILY WITH A MEAL 180 tablet 1  ? ?No facility-administered medications prior to visit.  ? ? ?ROS ?Review of Systems  ?Constitutional:  Negative for diaphoresis, fatigue and unexpected weight change.  ?HENT: Negative.    ?Eyes: Negative.   ?Respiratory:  Negative for cough, chest tightness, shortness of breath and wheezing.   ?Cardiovascular:  Negative for chest pain, palpitations and leg swelling.  ?Gastrointestinal:  Negative for abdominal pain, diarrhea and nausea.  ?Endocrine: Negative.   ?Genitourinary: Negative.  Negative for difficulty urinating.  ?Musculoskeletal: Negative.  Negative for myalgias.  ?Skin: Negative.   ?Neurological:  Negative for dizziness, light-headedness and headaches.  ?Hematological:  Negative for adenopathy. Does not bruise/bleed easily.  ?Psychiatric/Behavioral: Negative.    ? ?Objective:  ?BP 130/82 (BP Location: Right Arm, Patient Position: Sitting, Cuff Size: Normal)   Pulse 79   Temp 97.7 ?F (36.5 ?C) (Oral)   Ht 5' 9.5" (1.765 m)   Wt 209 lb (94.8 kg)   SpO2 97%   BMI 30.42 kg/m?  ? ?BP Readings from Last 3 Encounters:  ?08/06/21 130/82  ?12/18/20 (P) 116/76  ?05/30/20 118/72  ? ? ?Wt Readings from Last 3 Encounters:  ?08/06/21 209 lb (94.8 kg)  ?12/18/20 (P) 212  lb (96.2 kg)  ?05/30/20 215 lb (97.5 kg)  ? ? ?Physical Exam ?Vitals reviewed.  ?Constitutional:   ?   Appearance: He is not ill-appearing.  ?HENT:  ?   Nose: Nose normal.  ?   Mouth/Throat:  ?   Mouth: Mucous membranes are moist.  ?Eyes:  ?   General: No scleral icterus. ?   Conjunctiva/sclera: Conjunctivae normal.  ?Cardiovascular:  ?   Rate and Rhythm: Normal rate and regular rhythm.  ?   Heart sounds: No murmur heard. ?Pulmonary:  ?   Effort: Pulmonary effort is normal.  ?   Breath sounds: No stridor. No wheezing, rhonchi or rales.  ?Abdominal:  ?   General: Abdomen is flat.  ?   Palpations: There is no mass.  ?   Tenderness: There is no abdominal tenderness. There is no guarding.  ?   Hernia: No hernia is present.  ?Musculoskeletal:     ?   General: Normal range of motion.  ?   Cervical back: Neck supple.  ?   Right lower leg: No edema.  ?   Left lower leg: No edema.  ?Lymphadenopathy:  ?   Cervical: No cervical adenopathy.  ?Skin: ?   General: Skin is warm and dry.  ?Neurological:  ?   General: No focal deficit present.  ?   Mental Status: He is alert.  ?Psychiatric:     ?   Mood  and Affect: Mood normal.     ?   Behavior: Behavior normal.  ? ? ?Lab Results  ?Component Value Date  ? WBC 5.5 12/18/2020  ? HGB 14.5 12/18/2020  ? HCT 43.3 12/18/2020  ? PLT 236.0 12/18/2020  ? GLUCOSE 120 (H) 08/06/2021  ? CHOL 101 12/18/2020  ? TRIG 94.0 12/18/2020  ? HDL 41.00 12/18/2020  ? LDLDIRECT 84.0 10/10/2015  ? [place] 41 12/18/2020  ? ALT 17 12/18/2020  ? AST 18 12/18/2020  ? NA 138 08/06/2021  ? K 4.5 08/06/2021  ? CL 102 08/06/2021  ? CREATININE 1.28 08/06/2021  ? BUN 21 08/06/2021  ? CO2 31 08/06/2021  ? TSH 1.43 12/18/2020  ? PSA 1.11 12/18/2020  ? HGBA1C 7.3 (H) 08/06/2021  ? MICROALBUR <0.7 08/06/2021  ? ? ?DG Chest 2 View ? ?Result Date: 03/08/2018 ?CLINICAL DATA:  Dry cough for 1 week EXAM: CHEST - 2 VIEW COMPARISON:  None. FINDINGS: Coarse interstitial opacity may reflect bronchitic changes. No consolidation or  effusion. Normal heart size. No pneumothorax. IMPRESSION: 1. No focal pulmonary infiltrate. 2. Bronchitic changes. Electronically Signed   By: Donavan Foil M.D.   On: 03/08/2018 15:30  ? ? ?Assessment & Plan:  ? ?Michael Ashley was seen today for diabetes. ? ?Diagnoses and all orders for this visit: ? ?Type II diabetes mellitus with manifestations ([place])- His blood sugar is adequately well controlled. ?-     Microalbumin / creatinine urine ratio; Future ?-     Basic metabolic panel; Future ?-     Hemoglobin A1c; Future ?-     Ambulatory referral to Ophthalmology ?-     Hemoglobin A1c ?-     Basic metabolic panel ?-     Microalbumin / creatinine urine ratio ? ?Stage 3a chronic kidney disease ([place])- His renal function has improved. ?-     Microalbumin / creatinine urine ratio; Future ?-     Basic metabolic panel; Future ?-     Urinalysis, Routine w reflex microscopic; Future ?-     Urinalysis, Routine w reflex microscopic ?-     Basic metabolic panel ?-     Microalbumin / creatinine urine ratio ? ?Skin lesion of face ?-     Ambulatory referral to Dermatology ? ? ?I am having Michael Ashley maintain his metFORMIN, Farxiga, atorvastatin, candesartan, and gabapentin. ? ?No orders of the defined types were placed in this encounter. ? ? ? ?Follow-up: Return in about 6 months (around 02/05/2022). ? ?Michael Calico, MD ?

## 2021-09-07 ENCOUNTER — Other Ambulatory Visit: Payer: Self-pay | Admitting: Internal Medicine

## 2021-09-07 DIAGNOSIS — E118 Type 2 diabetes mellitus with unspecified complications: Secondary | ICD-10-CM

## 2021-09-07 DIAGNOSIS — E785 Hyperlipidemia, unspecified: Secondary | ICD-10-CM

## 2021-10-03 ENCOUNTER — Other Ambulatory Visit: Payer: Self-pay | Admitting: Internal Medicine

## 2021-10-03 DIAGNOSIS — E118 Type 2 diabetes mellitus with unspecified complications: Secondary | ICD-10-CM

## 2021-10-08 ENCOUNTER — Other Ambulatory Visit: Payer: Self-pay | Admitting: Internal Medicine

## 2021-10-08 DIAGNOSIS — E118 Type 2 diabetes mellitus with unspecified complications: Secondary | ICD-10-CM

## 2021-10-08 DIAGNOSIS — I10 Essential (primary) hypertension: Secondary | ICD-10-CM

## 2021-10-08 DIAGNOSIS — N1831 Chronic kidney disease, stage 3a: Secondary | ICD-10-CM

## 2021-11-21 ENCOUNTER — Ambulatory Visit (INDEPENDENT_AMBULATORY_CARE_PROVIDER_SITE_OTHER): Payer: Medicare HMO

## 2021-11-21 VITALS — BP 120/60 | HR 70 | Temp 97.8°F | Ht 69.5 in | Wt 204.6 lb

## 2021-11-21 DIAGNOSIS — Z0000 Encounter for general adult medical examination without abnormal findings: Secondary | ICD-10-CM

## 2021-11-21 NOTE — Progress Notes (Signed)
Subjective:   Michael Ashley is a 71 y.o. male who presents for Medicare Annual/Subsequent preventive examination.  Review of Systems     Cardiac Risk Factors include: advanced age (>63men, >39 women);diabetes mellitus;dyslipidemia;hypertension;male gender;family history of premature cardiovascular disease     Objective:    Today's Vitals   11/21/21 1440  BP: 120/60  Pulse: 70  Temp: 97.8 F (36.6 C)  SpO2: 95%  Weight: 204 lb 9.6 oz (92.8 kg)  Height: 5' 9.5" (1.765 m)  PainSc: 0-No pain   Body mass index is 29.78 kg/m.     11/21/2021    4:31 PM 12/17/2016   12:03 PM 10/13/2015   10:47 AM  Advanced Directives  Does Patient Have a Medical Advance Directive? No No Yes  Type of Best boy of [place];Living will  Does patient want to make changes to medical advance directive?   No - Patient declined  Copy of Healthcare Power of Attorney in Chart?   Yes  Would patient like information on creating a medical advance directive? No - Patient declined No - Patient declined     Current Medications (verified) Outpatient Encounter Medications as of 11/21/2021  Medication Sig   atorvastatin (LIPITOR) 20 MG tablet Take 1 tablet by mouth once daily   candesartan (ATACAND) 32 MG tablet Take 1 tablet by mouth once daily   FARXIGA 10 MG TABS tablet Take 1 tablet by mouth once daily   gabapentin (NEURONTIN) 300 MG capsule TAKE 1 CAPSULE BY MOUTH THREE TIMES DAILY   metFORMIN (GLUCOPHAGE) 1000 MG tablet TAKE 1 TABLET BY MOUTH TWICE DAILY WITH A MEAL   No facility-administered encounter medications on file as of 11/21/2021.    Allergies (verified) Lisinopril   History: Past Medical History:  Diagnosis Date   Abnormality of gait    Diabetes mellitus without complication (HCC)    Diabetic neuropathy (HCC)    Hypertension    Lumbar radiculopathy    Spinal stenosis    Past Surgical History:  Procedure Laterality Date   NO PAST SURGERIES     Family  History  Problem Relation Age of Onset   Heart failure Father    Kidney disease Brother    Social History   Socioeconomic History   Marital status: Married    Spouse name: Not on file   Number of children: 3   Years of education: Not on file   Highest education level: Master's degree (e.g., MA, MS, MEng, MEd, MSW, MBA)  Occupational History   Not on file  Tobacco Use   Smoking status: Some Days    Types: Cigarettes   Smokeless tobacco: Never  Vaping Use   Vaping Use: Never used  Substance and Sexual Activity   Alcohol use: No    Alcohol/week: 0.0 standard drinks of alcohol   Drug use: No   Sexual activity: Not on file  Other Topics Concern   Not on file  Social History Narrative   Lives at home alone, his [place] in [place]   Works at [employer]   Right handed   Caffeine: about 3-4 cups per day   Social Determinants of Health   Financial Resource Strain: Low Risk  (11/21/2021)   Overall Financial Resource Strain (CARDIA)    Difficulty of Paying Living Expenses: Not hard at all  Food Insecurity: No Food Insecurity (11/21/2021)   Hunger Vital Sign    Worried About Running Out of Food in the Last Year: Never true  Ran Out of Food in the Last Year: Never true  Transportation Needs: No Transportation Needs (11/21/2021)   PRAPARE - Administrator, Civil Service (Medical): No    Lack of Transportation (Non-Medical): No  Physical Activity: Sufficiently Active (11/21/2021)   Exercise Vital Sign    Days of Exercise per Week: 5 days    Minutes of Exercise per Session: 30 min  Stress: No Stress Concern Present (11/21/2021)   [employer] of Occupational Health - Occupational Stress Questionnaire    Feeling of Stress : Not at all  Social Connections: Socially Integrated (11/21/2021)   Social Connection and Isolation Panel [NHANES]    Frequency of Communication with Friends and Family: More than three times a week    Frequency of Social Gatherings  with Friends and Family: Once a week    Attends Religious Services: More than 4 times per year    Active Member of [employer] or Organizations: No    Attends Engineer, structural: More than 4 times per year    Marital Status: Married    Tobacco Counseling Ready to quit: Not Answered Counseling given: Not Answered   Clinical Intake:  Pre-visit preparation completed: Yes  Pain : No/denies pain Pain Score: 0-No pain     BMI - recorded: 29.78 Nutritional Status: BMI 25 -29 Overweight Nutritional Risks: None Diabetes: No  How often do you need to have someone help you when you read instructions, pamphlets, or other written materials from your doctor or pharmacy?: 1 - Never What is the last grade level you completed in school?: Master's Degree in Business  Diabetic? yes  Interpreter Needed?: No  Information entered by :: Susie Cassette, LPN.   Activities of Daily Living    11/21/2021    4:33 PM  In your present state of health, do you have any difficulty performing the following activities:  Hearing? 0  Vision? 0  Difficulty concentrating or making decisions? 0  Walking or climbing stairs? 0  Dressing or bathing? 0  Doing errands, shopping? 0  Preparing Food and eating ? N  Using the Toilet? N  In the past six months, have you accidently leaked urine? N  Do you have problems with loss of bowel control? N  Managing your Medications? N  Managing your Finances? N  Housekeeping or managing your Housekeeping? N    Patient Care Team: Etta Grandchild, MD as PCP - General (Internal Medicine)  Indicate any recent Medical Services you may have received from other than [facility] providers in the past year (date may be approximate).     Assessment:   This is a routine wellness examination for [place].  Hearing/Vision screen Hearing Screening - Comments:: Patient denied any hearing difficulty.   No hearing aids.  Vision Screening - Comments:: Patient does wear  corrective lenses/contacts.  Eye exam done by:     Dietary issues and exercise activities discussed: Current Exercise Habits: Home exercise routine, Type of exercise: walking, Time (Minutes): 30, Frequency (Times/Week): 5, Weekly Exercise (Minutes/Week): 150, Intensity: Moderate, Exercise limited by: None identified   Goals Addressed             This Visit's Progress    My goal is to maintain my health and stay active.        Depression Screen    11/21/2021    4:30 PM 05/30/2020   10:50 AM 11/08/2018   11:40 AM 03/08/2018    5:28 PM 03/08/2018    9:48 AM  12/17/2016   12:04 PM 12/16/2016    3:10 PM  PHQ 2/9 Scores  PHQ - 2 Score 0 0 0 0 0 0 0    Fall Risk    11/21/2021    4:33 PM 05/30/2020   10:50 AM 11/08/2018   11:40 AM 06/07/2018    2:15 PM 03/08/2018    5:28 PM  Fall Risk   Falls in the past year? 0 0 0 0 0  Number falls in past yr: 0  0 0   Injury with Fall? 0  0    Risk for fall due to : Impaired balance/gait      Follow up Falls prevention discussed  Falls evaluation completed      FALL RISK PREVENTION PERTAINING TO THE HOME:  Any stairs in or around the home? No  If so, are there any without handrails? No  Home free of loose throw rugs in walkways, pet beds, electrical cords, etc? Yes  Adequate lighting in your home to reduce risk of falls? Yes   ASSISTIVE DEVICES UTILIZED TO PREVENT FALLS:  Life alert? No  Use of a cane, walker or w/c? No  Grab bars in the bathroom? No  Shower chair or bench in shower? No  Elevated toilet seat or a handicapped toilet? No   TIMED UP AND GO:  Was the test performed? Yes .  Length of time to ambulate 10 feet: 6 sec.   Gait steady and fast without use of assistive device  Cognitive Function:        11/21/2021    4:35 PM  6CIT Screen  What Year? 0 points  What month? 0 points  What time? 0 points  Count back from 20 0 points  Months in reverse 0 points  Repeat phrase 0 points  Total Score 0 points     Immunizations Immunization History  Administered Date(s) Administered   Fluad Quad(high Dose 65+) 03/10/2019, 05/30/2020   Influenza, High Dose Seasonal PF 05/25/2016, 07/20/2017, 03/08/2018   PFIZER(Purple Top)SARS-COV-2 Vaccination 07/13/2019, 08/07/2019   Pneumococcal Conjugate-13 07/20/2017   Pneumococcal Polysaccharide-23 08/22/2013, 11/08/2018   Tdap 08/22/2013    TDAP status: Up to date  Flu Vaccine status: Up to date  Pneumococcal vaccine status: Up to date  Covid-19 vaccine status: Completed vaccines  Qualifies for Shingles Vaccine? Yes   Zostavax completed No   Shingrix Completed?: No.    Education has been provided regarding the importance of this vaccine. Patient has been advised to call insurance company to determine out of pocket expense if they have not yet received this vaccine. Advised may also receive vaccine at local pharmacy or Health Dept. Verbalized acceptance and understanding.  Screening Tests Health Maintenance  Topic Date Due   Zoster Vaccines- Shingrix (1 of 2) Never done   COVID-19 Vaccine (3 - Pfizer series) 10/02/2019   OPHTHALMOLOGY EXAM  07/04/2020   INFLUENZA VACCINE  12/02/2021   FOOT EXAM  12/18/2021   HEMOGLOBIN A1C  02/05/2022   Diabetic kidney evaluation - GFR measurement  08/07/2022   Diabetic kidney evaluation - Urine ACR  08/07/2022   Fecal DNA (Cologuard)  08/11/2023   TETANUS/TDAP  08/23/2023   Pneumonia Vaccine 30+ Years old  Completed   Hepatitis C Screening  Completed   HPV VACCINES  Aged Out    Health Maintenance  Health Maintenance Due  Topic Date Due   Zoster Vaccines- Shingrix (1 of 2) Never done   COVID-19 Vaccine (3 - Pfizer series) 10/02/2019  OPHTHALMOLOGY EXAM  07/04/2020    Colorectal cancer screening: Type of screening: Cologuard. Completed 08/10/2020. Repeat every 3 years  Lung Cancer Screening: (Low Dose CT Chest recommended if Age 86-80 years, 30 pack-year currently smoking OR have quit w/in 15years.)  does not qualify.   Lung Cancer Screening Referral: no  Additional Screening:  Hepatitis C Screening: does qualify; Completed 12/15/2016  Vision Screening: Recommended annual ophthalmology exams for early detection of glaucoma and other disorders of the eye. Is the patient up to date with their annual eye exam?  Yes  Who is the provider or what is the name of the office in which the patient attends annual eye exams? Ordered by PCP on 08/06/2021 If pt is not established with a provider, would they like to be referred to a provider to establish care? No .   Dental Screening: Recommended annual dental exams for proper oral hygiene  Community Resource Referral / Chronic Care Management: CRR required this visit?  No   CCM required this visit?  No      Plan:     I have personally reviewed and noted the following in the patient's chart:   Medical and social history Use of alcohol, tobacco or illicit drugs  Current medications and supplements including opioid prescriptions. Patient is not currently taking opioid prescriptions. Functional ability and status Nutritional status Physical activity Advanced directives List of other physicians Hospitalizations, surgeries, and ER visits in previous 12 months Vitals Screenings to include cognitive, depression, and falls Referrals and appointments  In addition, I have reviewed and discussed with patient certain preventive protocols, quality metrics, and best practice recommendations. A written personalized care plan for preventive services as well as general preventive health recommendations were provided to patient.     Mickeal Needy, LPN   [id number]   Nurse Notes:

## 2021-11-21 NOTE — Patient Instructions (Signed)
[name] , Thank you for taking time to come for your Medicare Wellness Visit. I appreciate your ongoing commitment to your health goals. Please review the following plan we discussed and let me know if I can assist you in the future.   Screening recommendations/referrals: Colonoscopy: Cologuard done 08/10/2020; due every 3 years Recommended yearly ophthalmology/optometry visit for glaucoma screening and checkup Recommended yearly dental visit for hygiene and checkup  Vaccinations: Influenza vaccine: 05/30/2020 Pneumococcal vaccine: 07/20/2017, 11/08/2018 Tdap vaccine: 08/22/2013; due every 10 years Shingles vaccine: never done   Covid-19: 07/13/2019, 08/07/2019  Advanced directives: No  Conditions/risks identified: Yes  Next appointment: Please schedule your next Medicare Wellness Visit with your Nurse Health Advisor in 1 year by calling [phone].  Preventive Care 60 Years and Older, Male Preventive care refers to lifestyle choices and visits with your health care provider that can promote health and wellness. What does preventive care include? A yearly physical exam. This is also called an annual well check. Dental exams once or twice a year. Routine eye exams. Ask your health care provider how often you should have your eyes checked. Personal lifestyle choices, including: Daily care of your teeth and gums. Regular physical activity. Eating a healthy diet. Avoiding tobacco and drug use. Limiting alcohol use. Practicing safe sex. Taking low doses of aspirin every day. Taking vitamin and mineral supplements as recommended by your health care provider. What happens during an annual well check? The services and screenings done by your health care provider during your annual well check will depend on your age, overall health, lifestyle risk factors, and family history of disease. Counseling  Your health care provider may ask you questions about your: Alcohol use. Tobacco use. Drug  use. Emotional well-being. Home and relationship well-being. Sexual activity. Eating habits. History of falls. Memory and ability to understand (cognition). Work and work Astronomer. Screening  You may have the following tests or measurements: Height, weight, and BMI. Blood pressure. Lipid and cholesterol levels. These may be checked every 5 years, or more frequently if you are over 60 years old. Skin check. Lung cancer screening. You may have this screening every year starting at age 50 if you have a 30-pack-year history of smoking and currently smoke or have quit within the past 15 years. Fecal occult blood test (FOBT) of the stool. You may have this test every year starting at age 66. Flexible sigmoidoscopy or colonoscopy. You may have a sigmoidoscopy every 5 years or a colonoscopy every 10 years starting at age 5. Prostate cancer screening. Recommendations will vary depending on your family history and other risks. Hepatitis C blood test. Hepatitis B blood test. Sexually transmitted disease (STD) testing. Diabetes screening. This is done by checking your blood sugar (glucose) after you have not eaten for a while (fasting). You may have this done every 1-3 years. Abdominal aortic aneurysm (AAA) screening. You may need this if you are a current or former smoker. Osteoporosis. You may be screened starting at age 56 if you are at high risk. Talk with your health care provider about your test results, treatment options, and if necessary, the need for more tests. Vaccines  Your health care provider may recommend certain vaccines, such as: Influenza vaccine. This is recommended every year. Tetanus, diphtheria, and acellular pertussis (Tdap, Td) vaccine. You may need a Td booster every 10 years. Zoster vaccine. You may need this after age 29. Pneumococcal 13-valent conjugate (PCV13) vaccine. One dose is recommended after age 41. Pneumococcal polysaccharide (PPSV23)  vaccine. One dose is  recommended after age 41. Talk to your health care provider about which screenings and vaccines you need and how often you need them. This information is not intended to replace advice given to you by your health care provider. Make sure you discuss any questions you have with your health care provider. Document Released: 05/17/2015 Document Revised: 01/08/2016 Document Reviewed: 02/19/2015 Elsevier Interactive Patient Education  2017 [employer] Prevention in the Home Falls can cause injuries. They can happen to people of all ages. There are many things you can do to make your home safe and to help prevent falls. What can I do on the outside of my home? Regularly fix the edges of walkways and driveways and fix any cracks. Remove anything that might make you trip as you walk through a door, such as a raised step or threshold. Trim any bushes or trees on the path to your home. Use bright outdoor lighting. Clear any walking paths of anything that might make someone trip, such as rocks or tools. Regularly check to see if handrails are loose or broken. Make sure that both sides of any steps have handrails. Any raised decks and porches should have guardrails on the edges. Have any leaves, snow, or ice cleared regularly. Use sand or salt on walking paths during winter. Clean up any spills in your garage right away. This includes oil or grease spills. What can I do in the bathroom? Use night lights. Install grab bars by the toilet and in the tub and shower. Do not use towel bars as grab bars. Use non-skid mats or decals in the tub or shower. If you need to sit down in the shower, use a plastic, non-slip stool. Keep the floor dry. Clean up any water that spills on the floor as soon as it happens. Remove soap buildup in the tub or shower regularly. Attach bath mats securely with double-sided non-slip rug tape. Do not have throw rugs and other things on the floor that can make you  trip. What can I do in the bedroom? Use night lights. Make sure that you have a light by your bed that is easy to reach. Do not use any sheets or blankets that are too big for your bed. They should not hang down onto the floor. Have a firm chair that has side arms. You can use this for support while you get dressed. Do not have throw rugs and other things on the floor that can make you trip. What can I do in the kitchen? Clean up any spills right away. Avoid walking on wet floors. Keep items that you use a lot in easy-to-reach places. If you need to reach something above you, use a strong step stool that has a grab bar. Keep electrical cords out of the way. Do not use floor polish or wax that makes floors slippery. If you must use wax, use non-skid floor wax. Do not have throw rugs and other things on the floor that can make you trip. What can I do with my stairs? Do not leave any items on the stairs. Make sure that there are handrails on both sides of the stairs and use them. Fix handrails that are broken or loose. Make sure that handrails are as long as the stairways. Check any carpeting to make sure that it is firmly attached to the stairs. Fix any carpet that is loose or worn. Avoid having throw rugs at the top or bottom  of the stairs. If you do have throw rugs, attach them to the floor with carpet tape. Make sure that you have a light switch at the top of the stairs and the bottom of the stairs. If you do not have them, ask someone to add them for you. What else can I do to help prevent falls? Wear shoes that: Do not have high heels. Have rubber bottoms. Are comfortable and fit you well. Are closed at the toe. Do not wear sandals. If you use a stepladder: Make sure that it is fully opened. Do not climb a closed stepladder. Make sure that both sides of the stepladder are locked into place. Ask someone to hold it for you, if possible. Clearly mark and make sure that you can  see: Any grab bars or handrails. First and last steps. Where the edge of each step is. Use tools that help you move around (mobility aids) if they are needed. These include: Canes. Walkers. Scooters. Crutches. Turn on the lights when you go into a dark area. Replace any light bulbs as soon as they burn out. Set up your furniture so you have a clear path. Avoid moving your furniture around. If any of your floors are uneven, fix them. If there are any pets around you, be aware of where they are. Review your medicines with your doctor. Some medicines can make you feel dizzy. This can increase your chance of falling. Ask your doctor what other things that you can do to help prevent falls. This information is not intended to replace advice given to you by your health care provider. Make sure you discuss any questions you have with your health care provider. Document Released: 02/14/2009 Document Revised: 09/26/2015 Document Reviewed: 05/25/2014 Elsevier Interactive Patient Education  2017 [employer].

## 2021-12-07 ENCOUNTER — Other Ambulatory Visit: Payer: Self-pay | Admitting: Internal Medicine

## 2021-12-07 DIAGNOSIS — E118 Type 2 diabetes mellitus with unspecified complications: Secondary | ICD-10-CM

## 2021-12-07 DIAGNOSIS — E785 Hyperlipidemia, unspecified: Secondary | ICD-10-CM

## 2021-12-07 DIAGNOSIS — N1831 Chronic kidney disease, stage 3a: Secondary | ICD-10-CM

## 2021-12-07 DIAGNOSIS — I10 Essential (primary) hypertension: Secondary | ICD-10-CM

## 2022-01-01 DIAGNOSIS — H25813 Combined forms of age-related cataract, bilateral: Secondary | ICD-10-CM | POA: Diagnosis not present

## 2022-01-01 DIAGNOSIS — E119 Type 2 diabetes mellitus without complications: Secondary | ICD-10-CM | POA: Diagnosis not present

## 2022-02-28 ENCOUNTER — Other Ambulatory Visit: Payer: Self-pay | Admitting: Internal Medicine

## 2022-02-28 DIAGNOSIS — E785 Hyperlipidemia, unspecified: Secondary | ICD-10-CM

## 2022-02-28 DIAGNOSIS — E118 Type 2 diabetes mellitus with unspecified complications: Secondary | ICD-10-CM

## 2022-03-30 DIAGNOSIS — E669 Obesity, unspecified: Secondary | ICD-10-CM | POA: Diagnosis not present

## 2022-03-30 DIAGNOSIS — E1122 Type 2 diabetes mellitus with diabetic chronic kidney disease: Secondary | ICD-10-CM | POA: Diagnosis not present

## 2022-03-30 DIAGNOSIS — N1831 Chronic kidney disease, stage 3a: Secondary | ICD-10-CM | POA: Diagnosis not present

## 2022-03-30 DIAGNOSIS — Z008 Encounter for other general examination: Secondary | ICD-10-CM | POA: Diagnosis not present

## 2022-03-30 DIAGNOSIS — Z6832 Body mass index (BMI) 32.0-32.9, adult: Secondary | ICD-10-CM | POA: Diagnosis not present

## 2022-03-30 DIAGNOSIS — F17210 Nicotine dependence, cigarettes, uncomplicated: Secondary | ICD-10-CM | POA: Diagnosis not present

## 2022-04-03 NOTE — Progress Notes (Deleted)
[name] [address] [place] [address] Phone: [phone] Subjective:    I'm seeing this patient by the request  of:  Etta Grandchild, MD  CC: Finger pain  [id number]  Michael Ashley is a 71 y.o. male coming in with complaint of finger pain. Last seen in 2015 for thumb trigger finger. Patient states   Onset-  Location Duration-  Character- Aggravating factors- Reliving factors-  Therapies tried-  Severity-     Past Medical History:  Diagnosis Date   Abnormality of gait    Diabetes mellitus without complication (HCC)    Diabetic neuropathy (HCC)    Hypertension    Lumbar radiculopathy    Spinal stenosis    Past Surgical History:  Procedure Laterality Date   NO PAST SURGERIES     Social History   Socioeconomic History   Marital status: Married    Spouse name: Not on file   Number of children: 3   Years of education: Not on file   Highest education level: Master's degree (e.g., MA, MS, MEng, MEd, MSW, MBA)  Occupational History   Not on file  Tobacco Use   Smoking status: Some Days    Types: Cigarettes   Smokeless tobacco: Never  Vaping Use   Vaping Use: Never used  Substance and Sexual Activity   Alcohol use: No    Alcohol/week: 0.0 standard drinks of alcohol   Drug use: No   Sexual activity: Not on file  Other Topics Concern   Not on file  Social History Narrative   Lives at home alone, his [place] in [place]   Works at [employer]   Right handed   Caffeine: about 3-4 cups per day   Social Determinants of Health   Financial Resource Strain: Low Risk  (11/21/2021)   Overall Financial Resource Strain (CARDIA)    Difficulty of Paying Living Expenses: Not hard at all  Food Insecurity: No Food Insecurity (11/21/2021)   Hunger Vital Sign    Worried About Running Out of Food in the Last Year: Never true    Ran Out of Food in the Last Year: Never true  Transportation Needs: No Transportation Needs  (11/21/2021)   PRAPARE - Administrator, Civil Service (Medical): No    Lack of Transportation (Non-Medical): No  Physical Activity: Sufficiently Active (11/21/2021)   Exercise Vital Sign    Days of Exercise per Week: 5 days    Minutes of Exercise per Session: 30 min  Stress: No Stress Concern Present (11/21/2021)   [employer] of Occupational Health - Occupational Stress Questionnaire    Feeling of Stress : Not at all  Social Connections: Socially Integrated (11/21/2021)   Social Connection and Isolation Panel [NHANES]    Frequency of Communication with Friends and Family: More than three times a week    Frequency of Social Gatherings with Friends and Family: Once a week    Attends Religious Services: More than 4 times per year    Active Member of [employer] or Organizations: No    Attends Engineer, structural: More than 4 times per year    Marital Status: Married   Allergies  Allergen Reactions   Lisinopril Cough   Family History  Problem Relation Age of Onset   Heart failure Father    Kidney disease Brother     Current Outpatient Medications (Endocrine & Metabolic):    FARXIGA 10 MG TABS tablet, Take 1 tablet by  mouth once daily   metFORMIN (GLUCOPHAGE) 1000 MG tablet, TAKE 1 TABLET BY MOUTH TWICE DAILY WITH A MEAL  Current Outpatient Medications (Cardiovascular):    atorvastatin (LIPITOR) 20 MG tablet, Take 1 tablet by mouth once daily   candesartan (ATACAND) 32 MG tablet, Take 1 tablet by mouth once daily     Current Outpatient Medications (Other):    gabapentin (NEURONTIN) 300 MG capsule, TAKE 1 CAPSULE BY MOUTH THREE TIMES DAILY   Reviewed prior external information including notes and imaging from  primary care provider As well as notes that were available from care everywhere and other healthcare systems.  Past medical history, social, surgical and family history all reviewed in electronic medical record.  No pertanent information unless  stated regarding to the chief complaint.   Review of Systems:  No headache, visual changes, nausea, vomiting, diarrhea, constipation, dizziness, abdominal pain, skin rash, fevers, chills, night sweats, weight loss, swollen lymph nodes, body aches, joint swelling, chest pain, shortness of breath, mood changes. POSITIVE muscle aches  Objective  There were no vitals taken for this visit.   General: No apparent distress alert and oriented x3 mood and affect normal, dressed appropriately.  HEENT: Pupils equal, extraocular movements intact  Respiratory: Patient's speak in full sentences and does not appear short of breath  Cardiovascular: No lower extremity edema, non tender, no erythema  Finger exam shows    Impression and Recommendations:     The above documentation has been reviewed and is accurate and complete Lyndal Pulley, DO

## 2022-04-05 ENCOUNTER — Other Ambulatory Visit: Payer: Self-pay | Admitting: Internal Medicine

## 2022-04-05 DIAGNOSIS — E118 Type 2 diabetes mellitus with unspecified complications: Secondary | ICD-10-CM

## 2022-04-06 ENCOUNTER — Ambulatory Visit: Payer: No Typology Code available for payment source | Admitting: Family Medicine

## 2022-04-24 ENCOUNTER — Other Ambulatory Visit: Payer: Self-pay | Admitting: Internal Medicine

## 2022-04-24 DIAGNOSIS — I10 Essential (primary) hypertension: Secondary | ICD-10-CM

## 2022-04-24 DIAGNOSIS — E118 Type 2 diabetes mellitus with unspecified complications: Secondary | ICD-10-CM

## 2022-04-24 DIAGNOSIS — N1831 Chronic kidney disease, stage 3a: Secondary | ICD-10-CM

## 2022-05-12 NOTE — Progress Notes (Signed)
[name] [address] [place] [address] Phone: [phone] Subjective:   Michael Ashley, am serving as a scribe for Dr. Antoine Primas.  I'm seeing this patient by the request  of:  Etta Grandchild, MD  CC: Hand pain  [id number]  Michael Ashley is a 72 y.o. male coming in for L hand middle finger. Woke up one day and noted stiffness in finger. Thought symptoms would subside but have not. Seen for trigger thumb in 2015.   Patient concerned about low blood pressure reading today. Does not feel dizzy or lightheaded. Unable to monitor BP at home but would like to try to get monitor through [facility].       Past Medical History:  Diagnosis Date   Abnormality of gait    Diabetes mellitus without complication (HCC)    Diabetic neuropathy (HCC)    Hypertension    Lumbar radiculopathy    Spinal stenosis    Past Surgical History:  Procedure Laterality Date   NO PAST SURGERIES     Social History   Socioeconomic History   Marital status: Married    Spouse name: Not on file   Number of children: 3   Years of education: Not on file   Highest education level: Master's degree (e.g., MA, MS, MEng, MEd, MSW, MBA)  Occupational History   Not on file  Tobacco Use   Smoking status: Some Days    Types: Cigarettes   Smokeless tobacco: Never  Vaping Use   Vaping Use: Never used  Substance and Sexual Activity   Alcohol use: No    Alcohol/week: 0.0 standard drinks of alcohol   Drug use: No   Sexual activity: Not on file  Other Topics Concern   Not on file  Social History Narrative   Lives at home alone, his wife in the [place]   Works at [employer]   Right handed   Caffeine: about 3-4 cups per day   Social Determinants of Health   Financial Resource Strain: Low Risk  (11/21/2021)   Overall Financial Resource Strain (CARDIA)    Difficulty of Paying Living Expenses: Not hard at all  Food Insecurity: No Food  Insecurity (11/21/2021)   Hunger Vital Sign    Worried About Running Out of Food in the Last Year: Never true    Ran Out of Food in the Last Year: Never true  Transportation Needs: No Transportation Needs (11/21/2021)   PRAPARE - Administrator, Civil Service (Medical): No    Lack of Transportation (Non-Medical): No  Physical Activity: Sufficiently Active (11/21/2021)   Exercise Vital Sign    Days of Exercise per Week: 5 days    Minutes of Exercise per Session: 30 min  Stress: No Stress Concern Present (11/21/2021)   [employer] of Occupational Health - Occupational Stress Questionnaire    Feeling of Stress : Not at all  Social Connections: Socially Integrated (11/21/2021)   Social Connection and Isolation Panel [NHANES]    Frequency of Communication with Friends and Family: More than three times a week    Frequency of Social Gatherings with Friends and Family: Once a week    Attends Religious Services: More than 4 times per year    Active Member of [employer] or Organizations: No    Attends Engineer, structural: More than 4 times per year    Marital Status: Married   Allergies  Allergen Reactions   Lisinopril Cough  Family History  Problem Relation Age of Onset   Heart failure Father    Kidney disease Brother     Current Outpatient Medications (Endocrine & Metabolic):    FARXIGA 10 MG TABS tablet, Take 1 tablet by mouth once daily   metFORMIN (GLUCOPHAGE) 1000 MG tablet, TAKE 1 TABLET BY MOUTH TWICE DAILY WITH A MEAL  Current Outpatient Medications (Cardiovascular):    atorvastatin (LIPITOR) 20 MG tablet, Take 1 tablet by mouth once daily   candesartan (ATACAND) 32 MG tablet, Take 1 tablet by mouth once daily     Current Outpatient Medications (Other):    AMBULATORY NON FORMULARY MEDICATION, 1 Units by Other route once for 1 dose.   gabapentin (NEURONTIN) 300 MG capsule, TAKE 1 CAPSULE BY MOUTH THREE TIMES DAILY   Reviewed prior external information  including notes and imaging from  primary care provider As well as notes that were available from care everywhere and other healthcare systems.  Past medical history, social, surgical and family history all reviewed in electronic medical record.  No pertanent information unless stated regarding to the chief complaint.   Review of Systems:  No headache, visual changes, nausea, vomiting, diarrhea, constipation, dizziness, abdominal pain, skin rash, fevers, chills, night sweats, weight loss, swollen lymph nodes, body aches, joint swelling, chest pain, shortness of breath, mood changes. POSITIVE muscle aches  Objective  Blood pressure 92/62, pulse 88, height 5' 9.5" (1.765 m), weight 206 lb (93.4 kg), SpO2 97 %.   General: No apparent distress alert and oriented x3 mood and affect normal, dressed appropriately.  HEENT: Pupils equal, extraocular movements intact  Respiratory: Patient's speak in full sentences and does not appear short of breath  Cardiovascular: No lower extremity edema, non tender, no erythema  Patient does have some swelling of the middle DIP joint.  Does not have full flexion.  Neurovascular intact distally.  Limited muscular skeletal ultrasound was performed and interpreted by Hulan Saas, M  Limited ultrasound of patient's finger shows PIP is unremarkable but DIP of the middle finger does have what appears to be a cortical irregularity noted.  Increasing in Doppler flow noted.  Trace effusion of the joint noted. Impression: Cortical irregularity of the DIP of the middle finger of the left hand.    Impression and Recommendations:

## 2022-05-18 ENCOUNTER — Ambulatory Visit (INDEPENDENT_AMBULATORY_CARE_PROVIDER_SITE_OTHER): Payer: No Typology Code available for payment source

## 2022-05-18 ENCOUNTER — Ambulatory Visit: Payer: Self-pay

## 2022-05-18 ENCOUNTER — Ambulatory Visit (INDEPENDENT_AMBULATORY_CARE_PROVIDER_SITE_OTHER): Payer: No Typology Code available for payment source | Admitting: Family Medicine

## 2022-05-18 VITALS — BP 92/62 | HR 88 | Ht 69.5 in | Wt 206.0 lb

## 2022-05-18 DIAGNOSIS — M79642 Pain in left hand: Secondary | ICD-10-CM

## 2022-05-18 DIAGNOSIS — M79641 Pain in right hand: Secondary | ICD-10-CM | POA: Diagnosis not present

## 2022-05-18 DIAGNOSIS — M79646 Pain in unspecified finger(s): Secondary | ICD-10-CM | POA: Diagnosis not present

## 2022-05-18 MED ORDER — AMBULATORY NON FORMULARY MEDICATION: 1.0000 [IU] | Freq: Once | 0 refills | Status: AC

## 2022-05-18 NOTE — Assessment & Plan Note (Signed)
Concern the patient did have an injury.  Over read of x-rays are pending, discussed icing regimen and home exercises, which activities to do and which ones to avoid, increase activity slowly.  Follow-up with me again in 6 to 8 weeks after bracing.  Worsening pain consider injection.

## 2022-05-18 NOTE — Patient Instructions (Addendum)
Stack splint Xray of L hand See me again in 3 weeks [facility] [address] [place], [place] [address] [phone]

## 2022-06-09 NOTE — Progress Notes (Unsigned)
Michael Ashley [place] [address] [place] Phone: [phone] Subjective:   IVilma Meckel, am serving as a scribe for Dr. Hulan Saas.  I'm seeing this patient by the request  of:  Janith Lima, MD  CC: Left finger pain follow-up  [id number]  05/18/2022 Concern the patient did have an injury.  Over read of x-rays are pending, discussed icing regimen and home exercises, which activities to do and which ones to avoid, increase activity slowly.  Follow-up with me again in 6 to 8 weeks after bracing.  Worsening pain consider injection.     Update 06/10/2022 Michael Ashley is a 72 y.o. male coming in with complaint of L hand, middle finger pain. Patient states doing better since wearing brace. Finger is not pain free. Doesn't wear brace consistently. No new issues.  Patient has been noncompliant wearing the brace regularly.      Past Medical History:  Diagnosis Date   Abnormality of gait    Diabetes mellitus without complication ([place])    Diabetic neuropathy ([place])    Hypertension    Lumbar radiculopathy    Spinal stenosis    Past Surgical History:  Procedure Laterality Date   NO PAST SURGERIES     Social History   Socioeconomic History   Marital status: Married    Spouse name: Not on file   Number of children: 3   Years of education: Not on file   Highest education level: Master's degree (e.g., MA, MS, MEng, MEd, MSW, MBA)  Occupational History   Not on file  Tobacco Use   Smoking status: Some Days    Types: Cigarettes   Smokeless tobacco: Never  Vaping Use   Vaping Use: Never used  Substance and Sexual Activity   Alcohol use: No    Alcohol/week: 0.0 standard drinks of alcohol   Drug use: No   Sexual activity: Not on file  Other Topics Concern   Not on file  Social History Narrative   Lives at home alone, his wife in the [place]   Works at [employer]   Right handed   Caffeine: about 3-4 cups per day   Social  Determinants of Health   Financial Resource Strain: Low Risk  (11/21/2021)   Overall Financial Resource Strain (CARDIA)    Difficulty of Paying Living Expenses: Not hard at all  Food Insecurity: No Food Insecurity (11/21/2021)   Hunger Vital Sign    Worried About Running Out of Food in the Last Year: Never true    [place] in the Last Year: Never true  Transportation Needs: No Transportation Needs (11/21/2021)   PRAPARE - Hydrologist (Medical): No    Lack of Transportation (Non-Medical): No  Physical Activity: Sufficiently Active (11/21/2021)   Exercise Vital Sign    Days of Exercise per Week: 5 days    Minutes of Exercise per Session: 30 min  Stress: No Stress Concern Present (11/21/2021)   [place]    Feeling of Stress : Not at all  Social Connections: [place] (11/21/2021)   Social Connection and Isolation Panel [NHANES]    Frequency of Communication with Friends and Family: More than three times a week    Frequency of Social Gatherings with Friends and Family: Once a week    Attends Religious Services: More than 4 times per year    Active Member of Clubs or  Organizations: No    Attends Music therapist: More than 4 times per year    Marital Status: Married   Allergies  Allergen Reactions   Lisinopril Cough   Family History  Problem Relation Age of Onset   Heart failure Father    Kidney disease Brother     Current Outpatient Medications (Endocrine & Metabolic):    FARXIGA 10 MG TABS tablet, Take 1 tablet by mouth once daily   metFORMIN (GLUCOPHAGE) 1000 MG tablet, TAKE 1 TABLET BY MOUTH TWICE DAILY WITH A MEAL  Current Outpatient Medications (Cardiovascular):    atorvastatin (LIPITOR) 20 MG tablet, Take 1 tablet by mouth once daily   candesartan (ATACAND) 32 MG tablet, Take 1 tablet by mouth once daily     Current Outpatient Medications  (Other):    gabapentin (NEURONTIN) 300 MG capsule, TAKE 1 CAPSULE BY MOUTH THREE TIMES DAILY   Reviewed prior external information including notes and imaging from  primary care provider As well as notes that were available from care everywhere and other healthcare systems.  Past medical history, social, surgical and family history all reviewed in electronic medical record.  No pertanent information unless stated regarding to the chief complaint.   Review of Systems:  No headache, visual changes, nausea, vomiting, diarrhea, constipation, dizziness, abdominal pain, skin rash, fevers, chills, night sweats, weight loss, swollen lymph nodes, body aches, joint swelling, chest pain, shortness of breath, mood changes. POSITIVE muscle aches  Objective  Blood pressure 130/78, pulse 78, height 5\' 9"  (1.753 m), weight 207 lb (93.9 kg), SpO2 98 %.   General: No apparent distress alert and oriented x3 mood and affect normal, dressed appropriately.  HEENT: Pupils equal, extraocular movements intact  Respiratory: Patient's speak in full sentences and does not appear short of breath  Cardiovascular: No lower extremity edema, non tender, no erythema  Hand exam still shows the patient does have some swelling of the DIP of the middle finger of the left hand.  Patient does have some tenderness to palpation noted in this area.  Still has some limited range of motion and full flexion of the DIP.  Limited muscular skeletal ultrasound was performed and interpreted by Hulan Saas, M   Limited ultrasound shows the patient does have some mild hypoechoic changes of the DIP still remaining but a significant improvement from previous imaging.  Questionable injury down to the extensor tendon noted. Impression: Interval improvement   Impression and Recommendations:      The above documentation has been reviewed and is accurate and complete Lyndal Pulley, DO

## 2022-06-10 ENCOUNTER — Encounter: Payer: Self-pay | Admitting: Family Medicine

## 2022-06-10 ENCOUNTER — Ambulatory Visit: Payer: Self-pay

## 2022-06-10 ENCOUNTER — Ambulatory Visit: Payer: No Typology Code available for payment source | Admitting: Family Medicine

## 2022-06-10 VITALS — BP 130/78 | HR 78 | Ht 69.0 in | Wt 207.0 lb

## 2022-06-10 DIAGNOSIS — M79646 Pain in unspecified finger(s): Secondary | ICD-10-CM | POA: Diagnosis not present

## 2022-06-10 NOTE — Assessment & Plan Note (Signed)
Patient's x-rays were unremarkable.  Patient does have what appears to be a potential cortical irregularity still in the patient is improving slowly.  Discussed in detail about bracing and how this could be beneficial for another 2 weeks then starting to increase activity and range of motion.  Discussed which activities to potentially avoid.  Follow-up with me again in 6 to 8 weeks otherwise.

## 2022-06-10 NOTE — Patient Instructions (Signed)
Wear the brace daily for 2 more weeks, then attempt moving it around Ice with activity See you again in 2 months just in case

## 2022-06-16 ENCOUNTER — Other Ambulatory Visit: Payer: Self-pay | Admitting: Internal Medicine

## 2022-06-16 DIAGNOSIS — E118 Type 2 diabetes mellitus with unspecified complications: Secondary | ICD-10-CM

## 2022-06-16 DIAGNOSIS — E785 Hyperlipidemia, unspecified: Secondary | ICD-10-CM

## 2022-07-07 ENCOUNTER — Other Ambulatory Visit: Payer: Self-pay | Admitting: Internal Medicine

## 2022-07-07 DIAGNOSIS — E118 Type 2 diabetes mellitus with unspecified complications: Secondary | ICD-10-CM

## 2022-07-10 ENCOUNTER — Other Ambulatory Visit: Payer: Self-pay | Admitting: Internal Medicine

## 2022-07-10 DIAGNOSIS — E118 Type 2 diabetes mellitus with unspecified complications: Secondary | ICD-10-CM

## 2022-07-15 ENCOUNTER — Ambulatory Visit: Payer: No Typology Code available for payment source | Admitting: Internal Medicine

## 2022-07-15 VITALS — BP 84/52 | HR 90 | Temp 97.7°F | Resp 16 | Ht 69.0 in | Wt 203.0 lb

## 2022-07-15 DIAGNOSIS — E118 Type 2 diabetes mellitus with unspecified complications: Secondary | ICD-10-CM | POA: Diagnosis not present

## 2022-07-15 DIAGNOSIS — I10 Essential (primary) hypertension: Secondary | ICD-10-CM | POA: Diagnosis not present

## 2022-07-15 DIAGNOSIS — E785 Hyperlipidemia, unspecified: Secondary | ICD-10-CM

## 2022-07-15 DIAGNOSIS — N1831 Chronic kidney disease, stage 3a: Secondary | ICD-10-CM | POA: Diagnosis not present

## 2022-07-15 DIAGNOSIS — Z794 Long term (current) use of insulin: Secondary | ICD-10-CM | POA: Diagnosis not present

## 2022-07-15 DIAGNOSIS — E119 Type 2 diabetes mellitus without complications: Secondary | ICD-10-CM | POA: Diagnosis not present

## 2022-07-15 DIAGNOSIS — I9589 Other hypotension: Secondary | ICD-10-CM | POA: Diagnosis not present

## 2022-07-15 DIAGNOSIS — E861 Hypovolemia: Secondary | ICD-10-CM | POA: Diagnosis not present

## 2022-07-15 LAB — URINALYSIS, ROUTINE W REFLEX MICROSCOPIC
Bilirubin Urine: NEGATIVE
Hgb urine dipstick: NEGATIVE
Ketones, ur: NEGATIVE
Leukocytes,Ua: NEGATIVE
Nitrite: NEGATIVE
RBC / HPF: NONE SEEN (ref 0–?)
Specific Gravity, Urine: 1.015 (ref 1.000–1.030)
Total Protein, Urine: NEGATIVE
Urine Glucose: >=1000 — AB
Urobilinogen, UA: 0.2 (ref 0.0–1.0)
pH: 5.5 (ref 5.0–8.0)

## 2022-07-15 LAB — CBC WITH DIFFERENTIAL/PLATELET
Basophils Absolute: 0 10*3/uL (ref 0.0–0.1)
Basophils Relative: 0.6 % (ref 0.0–3.0)
Eosinophils Absolute: 0.3 10*3/uL (ref 0.0–0.7)
Eosinophils Relative: 5 % (ref 0.0–5.0)
HCT: 44.9 % (ref 39.0–52.0)
Hemoglobin: 15.1 g/dL (ref 13.0–17.0)
Lymphocytes Relative: 20.5 % (ref 12.0–46.0)
Lymphs Abs: 1.4 10*3/uL (ref 0.7–4.0)
MCHC: 33.5 g/dL (ref 30.0–36.0)
MCV: 93.5 fl (ref 78.0–100.0)
Monocytes Absolute: 0.5 10*3/uL (ref 0.1–1.0)
Monocytes Relative: 6.9 % (ref 3.0–12.0)
Neutro Abs: 4.6 10*3/uL (ref 1.4–7.7)
Neutrophils Relative %: 67 % (ref 43.0–77.0)
Platelets: 228 10*3/uL (ref 150.0–400.0)
RBC: 4.8 Mil/uL (ref 4.22–5.81)
RDW: 13.9 % (ref 11.5–15.5)
WBC: 6.9 10*3/uL (ref 4.0–10.5)

## 2022-07-15 LAB — HEMOGLOBIN A1C: Hgb A1c MFr Bld: 10.4 % — ABNORMAL HIGH (ref 4.6–6.5)

## 2022-07-15 LAB — BASIC METABOLIC PANEL
BUN: 34 mg/dL — ABNORMAL HIGH (ref 6–23)
CO2: 27 mEq/L (ref 19–32)
Calcium: 9.9 mg/dL (ref 8.4–10.5)
Chloride: 98 mEq/L (ref 96–112)
Creatinine, Ser: 1.56 mg/dL — ABNORMAL HIGH (ref 0.40–1.50)
GFR: 43.75 mL/min — ABNORMAL LOW (ref >60.00)
Glucose, Bld: 248 mg/dL — ABNORMAL HIGH (ref 70–99)
Potassium: 4.6 mEq/L (ref 3.5–5.1)
Sodium: 136 mEq/L (ref 135–145)

## 2022-07-15 LAB — HEPATIC FUNCTION PANEL
ALT: 19 U/L (ref 0–53)
AST: 17 U/L (ref 0–37)
Albumin: 4.1 g/dL (ref 3.5–5.2)
Alkaline Phosphatase: 83 U/L (ref 39–117)
Bilirubin, Direct: 0.2 mg/dL (ref 0.0–0.3)
Total Bilirubin: 0.8 mg/dL (ref 0.2–1.2)
Total Protein: 7.4 g/dL (ref 6.0–8.3)

## 2022-07-15 LAB — MICROALBUMIN / CREATININE URINE RATIO
Creatinine,U: 129.5 mg/dL
Microalb Creat Ratio: 0.6 mg/g (ref 0.0–30.0)
Microalb, Ur: 0.8 mg/dL (ref 0.0–1.9)

## 2022-07-15 LAB — TSH: TSH: 3.43 u[IU]/mL (ref 0.35–5.50)

## 2022-07-15 LAB — CORTISOL: Cortisol, Plasma: 11.9 ug/dL

## 2022-07-15 MED ORDER — FREESTYLE LIBRE 3 READER DEVI: 1.0000 | Freq: Every day | 3 refills | Status: DC

## 2022-07-15 MED ORDER — METFORMIN HCL ER 500 MG PO TB24: 500.0000 mg | ORAL_TABLET | Freq: Every day | ORAL | 0 refills | Status: DC

## 2022-07-15 MED ORDER — TOUJEO MAX SOLOSTAR 300 UNIT/ML ~~LOC~~ SOPN: 30.0000 [IU] | PEN_INJECTOR | Freq: Every day | SUBCUTANEOUS | 0 refills | Status: DC

## 2022-07-15 MED ORDER — FREESTYLE LIBRE 3 SENSOR MISC: 1.0000 | Freq: Every day | 5 refills | Status: DC

## 2022-07-15 MED ORDER — INSULIN PEN NEEDLE 32G X 6 MM MISC: 1.0000 | Freq: Every day | 1 refills | Status: DC

## 2022-07-15 NOTE — Progress Notes (Signed)
Subjective:  Patient ID: Michael Ashley, male    DOB: 1950/06/10  Age: 72 y.o. MRN: [id number]  CC: Hypertension and Diabetes   HPI Michael Ashley presents for f/up ---  He complains that his blood sugar has recently been as high as 300 and he complains of polys.  Outpatient Medications Prior to Visit  Medication Sig Dispense Refill   atorvastatin (LIPITOR) 20 MG tablet Take 1 tablet by mouth once daily 90 tablet 0   FARXIGA 10 MG TABS tablet Take 1 tablet by mouth once daily 90 tablet 0   gabapentin (NEURONTIN) 300 MG capsule TAKE 1 CAPSULE BY MOUTH THREE TIMES DAILY 90 capsule 3   candesartan (ATACAND) 32 MG tablet Take 1 tablet by mouth once daily 90 tablet 0   metFORMIN (GLUCOPHAGE) 1000 MG tablet TAKE 1 TABLET BY MOUTH TWICE DAILY WITH A MEAL 180 tablet 1   Continuous Blood Gluc Receiver (FREESTYLE LIBRE 2 READER) DEVI See admin instructions. (Patient not taking: Reported on 07/15/2022)     Continuous Blood Gluc Sensor (FREESTYLE LIBRE 2 SENSOR) MISC See admin instructions. (Patient not taking: Reported on 07/15/2022)     No facility-administered medications prior to visit.    ROS Review of Systems  Constitutional:  Positive for unexpected weight change (wt loss). Negative for chills, diaphoresis and fatigue.  HENT: Negative.    Eyes: Negative.  Negative for visual disturbance.  Respiratory:  Negative for cough, chest tightness, shortness of breath and wheezing.   Cardiovascular:  Negative for chest pain, palpitations and leg swelling.  Gastrointestinal: Negative.  Negative for abdominal pain, constipation, diarrhea, nausea and vomiting.  Endocrine: Positive for polydipsia, polyphagia and polyuria.  Genitourinary: Negative.  Negative for difficulty urinating.  Musculoskeletal:  Positive for back pain and gait problem. Negative for myalgias and neck pain.  Skin: Negative.   Neurological:  Positive for dizziness. Negative for weakness and light-headedness.  Hematological:  Negative  for adenopathy. Does not bruise/bleed easily.  Psychiatric/Behavioral: Negative.      Objective:  BP (!) 84/52 (BP Location: Right Arm, Patient Position: Sitting, Cuff Size: Large)   Pulse 90   Temp 97.7 F (36.5 C) (Oral)   Resp 16   Ht 5\' 9"  (1.753 m)   Wt 203 lb (92.1 kg)   SpO2 96%   BMI 29.98 kg/m   BP Readings from Last 3 Encounters:  07/15/22 (!) 84/52  06/10/22 130/78  05/18/22 92/62    Wt Readings from Last 3 Encounters:  07/15/22 203 lb (92.1 kg)  06/10/22 207 lb (93.9 kg)  05/18/22 206 lb (93.4 kg)    Physical Exam Vitals reviewed.  HENT:     Nose: Nose normal.     Mouth/Throat:     Mouth: Mucous membranes are moist.  Eyes:     General: No scleral icterus.    Conjunctiva/sclera: Conjunctivae normal.  Cardiovascular:     Rate and Rhythm: Normal rate and regular rhythm.     Heart sounds: Normal heart sounds and S1 normal. No murmur heard.    No friction rub. No gallop.     Comments: EKG--- NSR, 88 bpm No LVH, Q waves, or ST/T wave changes Normal EKG Pulmonary:     Effort: Pulmonary effort is normal.     Breath sounds: No stridor. No wheezing, rhonchi or rales.  Abdominal:     General: Abdomen is flat.     Palpations: There is no mass.     Tenderness: There is no abdominal tenderness. There is no  guarding.     Hernia: No hernia is present.  Musculoskeletal:     Cervical back: Neck supple.     Right lower leg: No edema.     Left lower leg: No edema.  Skin:    General: Skin is warm and dry.  Neurological:     General: No focal deficit present.     Mental Status: He is alert. Mental status is at baseline.  Psychiatric:        Mood and Affect: Mood normal.        Behavior: Behavior normal.     Lab Results  Component Value Date   WBC 6.9 07/15/2022   HGB 15.1 07/15/2022   HCT 44.9 07/15/2022   PLT 228.0 07/15/2022   GLUCOSE 248 (H) 07/15/2022   CHOL 101 12/18/2020   TRIG 94.0 12/18/2020   HDL 41.00 12/18/2020   LDLDIRECT 84.0 10/10/2015    LDLCALC 41 12/18/2020   ALT 19 07/15/2022   AST 17 07/15/2022   NA 136 07/15/2022   K 4.6 07/15/2022   CL 98 07/15/2022   CREATININE 1.56 (H) 07/15/2022   BUN 34 (H) 07/15/2022   CO2 27 07/15/2022   TSH 3.43 07/15/2022   PSA 1.11 12/18/2020   HGBA1C 10.4 Repeated and verified X2. (H) 07/15/2022   MICROALBUR 0.8 07/15/2022    DG Chest 2 View  Result Date: 03/08/2018 CLINICAL DATA:  Dry cough for 1 week EXAM: CHEST - 2 VIEW COMPARISON:  None. FINDINGS: Coarse interstitial opacity may reflect bronchitic changes. No consolidation or effusion. Normal heart size. No pneumothorax. IMPRESSION: 1. No focal pulmonary infiltrate. 2. Bronchitic changes. Electronically Signed   By: Donavan Foil M.D.   On: 03/08/2018 15:30    Assessment & Plan:  Essential hypertension, benign- His blood pressure is soft.  Will d'c the ARB. -     TSH; Future -     Urinalysis, Routine w reflex microscopic; Future -     Hepatic function panel; Future -     Basic metabolic panel; Future -     CBC with Differential/Platelet; Future  Type II diabetes mellitus with manifestations (Michael Ashley) -     Hepatic function panel; Future -     Hemoglobin A1c; Future -     Microalbumin / creatinine urine ratio; Future -     Basic metabolic panel; Future  Hyperlipidemia with target LDL less than 70 -     TSH; Future  Stage 3a chronic kidney disease (Michael Ashley)- His renal function has declined.  Will try to get better control of his blood sugar. -     Microalbumin / creatinine urine ratio; Future  Hypotension due to hypovolemia- He has glucose toxicity.  Will start insulin. -     Cortisol; Future -     TSH; Future -     Urinalysis, Routine w reflex microscopic; Future -     Hepatic function panel; Future -     Basic metabolic panel; Future -     CBC with Differential/Platelet; Future  Insulin-requiring or dependent type II diabetes mellitus (Michael Ashley)- His A1c is up to 10.4%.  Will restart metformin and will start basal/bolus  insulin. -     Toujeo Max SoloStar; Inject 30 Units into the skin daily.  Dispense: 9 mL; Refill: 0 -     FreeStyle Libre 3 Sensor; 1 Act by Does not apply route daily. Place 1 sensor on the skin every 14 days. Use to check glucose continuously  Dispense: 2 each; Refill:  5 -     FreeStyle Libre 3 Reader; 1 Act by Does not apply route daily.  Dispense: 1 each; Refill: 3 -     metFORMIN HCl ER; Take 1 tablet (500 mg total) by mouth daily with breakfast.  Dispense: 90 tablet; Refill: 0 -     Insulin Pen Needle; 1 Act by Does not apply route daily.  Dispense: 100 each; Refill: 1 -     HM Diabetes Foot Exam     Follow-up: No follow-ups on file.  Scarlette Calico, MD

## 2022-07-17 ENCOUNTER — Encounter: Payer: Self-pay | Admitting: Internal Medicine

## 2022-08-10 NOTE — Progress Notes (Unsigned)
[name] [address] [place] [address] Phone: [phone] Subjective:   Michael Ashley, am serving as a scribe for Dr. Antoine Primas.  I'm seeing this patient by the request  of:  Etta Grandchild, MD  CC: hand pain follow up   [id number]  2/7/20254 Patient's x-rays were unremarkable.  Patient does have what appears to be a potential cortical irregularity still in the patient is improving slowly.  Discussed in detail about bracing and how this could be beneficial for another 2 weeks then starting to increase activity and range of motion.  Discussed which activities to potentially avoid.  Follow-up with me again in 6 to 8 weeks otherwise.     Update 08/12/2022 Michael Ashley is a 72 y.o. male coming in with complaint of R hand, middle finger pain. Patient states he has not have any improvement since last visit. Unable to make fist. No pain. Feels like index finger is now doing same thing as middle finger.       Past Medical History:  Diagnosis Date   Abnormality of gait    Diabetes mellitus without complication ([facility])    Diabetic neuropathy ([facility])    Hypertension    Lumbar radiculopathy    Spinal stenosis    Past Surgical History:  Procedure Laterality Date   NO PAST SURGERIES     Social History   Socioeconomic History   Marital status: Married    Spouse name: Not on file   Number of children: 3   Years of education: Not on file   Highest education level: Master's degree (e.g., MA, MS, MEng, MEd, MSW, MBA)  Occupational History   Not on file  Tobacco Use   Smoking status: Some Days    Types: Cigarettes   Smokeless tobacco: Never  Vaping Use   Vaping Use: Never used  Substance and Sexual Activity   Alcohol use: No    Alcohol/week: 0.0 standard drinks of alcohol   Drug use: No   Sexual activity: Not on file  Other Topics Concern   Not on file  Social History Narrative   Lives at home alone, his wife in the [place]    Works at [employer]   Right handed   Caffeine: about 3-4 cups per day   Social Determinants of Health   Financial Resource Strain: Low Risk  (11/21/2021)   Overall Financial Resource Strain (CARDIA)    Difficulty of Paying Living Expenses: Not hard at all  Food Insecurity: No Food Insecurity (11/21/2021)   Hunger Vital Sign    Worried About Running Out of Food in the Last Year: Never true    Ran Out of Food in the Last Year: Never true  Transportation Needs: No Transportation Needs (11/21/2021)   PRAPARE - Administrator, Civil Service (Medical): No    Lack of Transportation (Non-Medical): No  Physical Activity: Sufficiently Active (11/21/2021)   Exercise Vital Sign    Days of Exercise per Week: 5 days    Minutes of Exercise per Session: 30 min  Stress: No Stress Concern Present (11/21/2021)   [employer] of Occupational Health - Occupational Stress Questionnaire    Feeling of Stress : Not at all  Social Connections: Socially Integrated (11/21/2021)   Social Connection and Isolation Panel [NHANES]    Frequency of Communication with Friends and Family: More than three times a week    Frequency of Social Gatherings with Friends and Family: Once a week  Attends Religious Services: More than 4 times per year    Active Member of Clubs or Organizations: No    Attends Engineer, structural: More than 4 times per year    Marital Status: Married   Allergies  Allergen Reactions   Lisinopril Cough   Family History  Problem Relation Age of Onset   Heart failure Father    Kidney disease Brother     Current Outpatient Medications (Endocrine & Metabolic):    FARXIGA 10 MG TABS tablet, Take 1 tablet by mouth once daily   insulin glargine, 2 Unit Dial, (TOUJEO MAX SOLOSTAR) 300 UNIT/ML Solostar Pen, Inject 30 Units into the skin daily.   metFORMIN (GLUCOPHAGE-XR) 500 MG 24 hr tablet, Take 1 tablet (500 mg total) by mouth daily with breakfast.  Current  Outpatient Medications (Cardiovascular):    atorvastatin (LIPITOR) 20 MG tablet, Take 1 tablet by mouth once daily     Current Outpatient Medications (Other):    Continuous Blood Gluc Receiver (FREESTYLE LIBRE 3 READER) DEVI, 1 Act by Does not apply route daily.   Continuous Blood Gluc Sensor (FREESTYLE LIBRE 3 SENSOR) MISC, 1 Act by Does not apply route daily. Place 1 sensor on the skin every 14 days. Use to check glucose continuously   gabapentin (NEURONTIN) 300 MG capsule, TAKE 1 CAPSULE BY MOUTH THREE TIMES DAILY   Insulin Pen Needle 32G X 6 MM MISC, 1 Act by Does not apply route daily.    Objective  Blood pressure 126/70, pulse 70, height 5\' 9"  (1.753 m), weight 205 lb (93 kg), SpO2 98 %.   General: No apparent distress alert and oriented x3 mood and affect normal, dressed appropriately.  HEENT: Pupils equal, extraocular movements intact  Respiratory: Patient's speak in full sentences and does not appear short of breath  Cardiovascular: No lower extremity edema, non tender, no erythema  Hand exam 3rd finger still lacking the last 5 degrees of extension noted.  Does have some swelling of the DIP noted compared to the other fingers.  No erythema noted.  Limited muscular skeletal ultrasound was performed and interpreted by Antoine Primas, M  Limited ultrasound does not show any significant arthritic changes but mild.  Still has the avulsion likely noted of the extensor tendon.  Proximal to the DIP joint.  Mild increase in Doppler flow.    Impression and Recommendations:     The above documentation has been reviewed and is accurate and complete Judi Saa, DO

## 2022-08-12 ENCOUNTER — Ambulatory Visit: Payer: No Typology Code available for payment source | Admitting: Internal Medicine

## 2022-08-12 ENCOUNTER — Encounter: Payer: Self-pay | Admitting: Family Medicine

## 2022-08-12 ENCOUNTER — Encounter: Payer: Self-pay | Admitting: Internal Medicine

## 2022-08-12 ENCOUNTER — Ambulatory Visit: Payer: No Typology Code available for payment source | Admitting: Family Medicine

## 2022-08-12 ENCOUNTER — Ambulatory Visit: Payer: Self-pay

## 2022-08-12 VITALS — BP 126/70 | HR 70 | Ht 69.0 in | Wt 205.0 lb

## 2022-08-12 VITALS — BP 124/78 | HR 88 | Temp 98.6°F | Ht 69.0 in | Wt 205.0 lb

## 2022-08-12 DIAGNOSIS — R931 Abnormal findings on diagnostic imaging of heart and coronary circulation: Secondary | ICD-10-CM

## 2022-08-12 DIAGNOSIS — Z7984 Long term (current) use of oral hypoglycemic drugs: Secondary | ICD-10-CM

## 2022-08-12 DIAGNOSIS — M79646 Pain in unspecified finger(s): Secondary | ICD-10-CM | POA: Diagnosis not present

## 2022-08-12 DIAGNOSIS — M79644 Pain in right finger(s): Secondary | ICD-10-CM | POA: Diagnosis not present

## 2022-08-12 DIAGNOSIS — N1831 Chronic kidney disease, stage 3a: Secondary | ICD-10-CM | POA: Diagnosis not present

## 2022-08-12 DIAGNOSIS — Z125 Encounter for screening for malignant neoplasm of prostate: Secondary | ICD-10-CM | POA: Diagnosis not present

## 2022-08-12 DIAGNOSIS — E118 Type 2 diabetes mellitus with unspecified complications: Secondary | ICD-10-CM

## 2022-08-12 DIAGNOSIS — Z794 Long term (current) use of insulin: Secondary | ICD-10-CM

## 2022-08-12 DIAGNOSIS — E785 Hyperlipidemia, unspecified: Secondary | ICD-10-CM

## 2022-08-12 DIAGNOSIS — N400 Enlarged prostate without lower urinary tract symptoms: Secondary | ICD-10-CM

## 2022-08-12 LAB — BASIC METABOLIC PANEL
BUN: 23 mg/dL (ref 6–23)
CO2: 30 mEq/L (ref 19–32)
Calcium: 9.6 mg/dL (ref 8.4–10.5)
Chloride: 102 mEq/L (ref 96–112)
Creatinine, Ser: 1.44 mg/dL (ref 0.40–1.50)
GFR: 48.14 mL/min — ABNORMAL LOW (ref >60.00)
Glucose, Bld: 169 mg/dL — ABNORMAL HIGH (ref 70–99)
Potassium: 4.3 mEq/L (ref 3.5–5.1)
Sodium: 141 mEq/L (ref 135–145)

## 2022-08-12 LAB — PSA: PSA: 1.25 ng/mL (ref 0.10–4.00)

## 2022-08-12 LAB — LIPID PANEL
Cholesterol: 114 mg/dL (ref 0–200)
HDL: 41.1 mg/dL (ref >39.00)
LDL Cholesterol: 35 mg/dL (ref 0–99)
NonHDL: 72.47
Total CHOL/HDL Ratio: 3
Triglycerides: 186 mg/dL — ABNORMAL HIGH (ref 0.0–149.0)
VLDL: 37.2 mg/dL (ref 0.0–40.0)

## 2022-08-12 NOTE — Patient Instructions (Signed)

## 2022-08-12 NOTE — Assessment & Plan Note (Signed)
Discussed HEP  Discussed which activities to do  Worsening pain will consider injection

## 2022-08-12 NOTE — Patient Instructions (Signed)
Good to see you Lets give it more time Ok to massage and stretch finger See me in 3 months

## 2022-08-12 NOTE — Progress Notes (Signed)
Subjective:  Patient ID: Michael Ashley, male    DOB: Sep 03, 1950  Age: 72 y.o. MRN: [id number]  CC: Hypertension and Diabetes   HPI Michael Ashley presents for f/up ---  He denies chest pain, shortness of breath, diaphoresis, or edema.  Outpatient Medications Prior to Visit  Medication Sig Dispense Refill   atorvastatin (LIPITOR) 20 MG tablet Take 1 tablet by mouth once daily 90 tablet 0   Continuous Blood Gluc Receiver (FREESTYLE LIBRE 3 READER) DEVI 1 Act by Does not apply route daily. 1 each 3   Continuous Blood Gluc Sensor (FREESTYLE LIBRE 3 SENSOR) MISC 1 Act by Does not apply route daily. Place 1 sensor on the skin every 14 days. Use to check glucose continuously 2 each 5   gabapentin (NEURONTIN) 300 MG capsule TAKE 1 CAPSULE BY MOUTH THREE TIMES DAILY 90 capsule 3   insulin glargine, 2 Unit Dial, (TOUJEO MAX SOLOSTAR) 300 UNIT/ML Solostar Pen Inject 30 Units into the skin daily. 9 mL 0   Insulin Pen Needle 32G X 6 MM MISC 1 Act by Does not apply route daily. 100 each 1   metFORMIN (GLUCOPHAGE-XR) 500 MG 24 hr tablet Take 1 tablet (500 mg total) by mouth daily with breakfast. 90 tablet 0   FARXIGA 10 MG TABS tablet Take 1 tablet by mouth once daily 90 tablet 0   No facility-administered medications prior to visit.    ROS Review of Systems  Constitutional: Negative.  Negative for diaphoresis and fatigue.  HENT: Negative.    Eyes: Negative.   Respiratory:  Negative for cough, chest tightness, shortness of breath and wheezing.   Cardiovascular:  Negative for chest pain, palpitations and leg swelling.  Gastrointestinal:  Negative for abdominal pain, constipation, diarrhea, nausea and vomiting.  Endocrine: Negative.   Genitourinary: Negative.  Negative for difficulty urinating.  Musculoskeletal:  Positive for gait problem.  Skin: Negative.  Negative for color change and pallor.  Neurological:  Negative for dizziness, weakness and light-headedness.  Hematological:  Negative for  adenopathy. Does not bruise/bleed easily.  Psychiatric/Behavioral: Negative.      Objective:  BP 124/78 (BP Location: Right Arm, Patient Position: Sitting, Cuff Size: Normal)   Pulse 88   Temp 98.6 F (37 C) (Oral)   Ht  (1.753 m)   Wt 205 lb (93 kg)   SpO2 96%   BMI 30.27 kg/m   BP Readings from Last 3 Encounters:  08/12/22 124/78  08/12/22 126/70  07/15/22 (!) 84/52    Wt Readings from Last 3 Encounters:  08/12/22 205 lb (93 kg)  08/12/22 205 lb (93 kg)  07/15/22 203 lb (92.1 kg)    Physical Exam Vitals reviewed.  HENT:     Nose: Nose normal.     Mouth/Throat:     Mouth: Mucous membranes are moist.  Eyes:     General: No scleral icterus.    Conjunctiva/sclera: Conjunctivae normal.  Cardiovascular:     Rate and Rhythm: Normal rate and regular rhythm.     Pulses: Normal pulses.     Heart sounds: No murmur heard.    No gallop.  Pulmonary:     Effort: Pulmonary effort is normal.     Breath sounds: No stridor. No wheezing, rhonchi or rales.  Abdominal:     General: Abdomen is flat.     Palpations: There is no mass.     Tenderness: There is no abdominal tenderness. There is no guarding.     Hernia: No hernia is  present.  Musculoskeletal:     Cervical back: Neck supple.  Lymphadenopathy:     Cervical: No cervical adenopathy.  Skin:    General: Skin is warm and dry.  Neurological:     General: No focal deficit present.     Mental Status: He is alert. Mental status is at baseline.  Psychiatric:        Mood and Affect: Mood normal.        Behavior: Behavior normal.     Lab Results  Component Value Date   WBC 6.9 07/15/2022   HGB 15.1 07/15/2022   HCT 44.9 07/15/2022   PLT 228.0 07/15/2022   GLUCOSE 169 (H) 08/12/2022   CHOL 114 08/12/2022   TRIG 186.0 (H) 08/12/2022   HDL 41.10 08/12/2022   LDLDIRECT 84.0 10/10/2015   LDLCALC 35 08/12/2022   ALT 19 07/15/2022   AST 17 07/15/2022   NA 141 08/12/2022   K 4.3 08/12/2022   CL 102 08/12/2022    CREATININE 1.44 08/12/2022   BUN 23 08/12/2022   CO2 30 08/12/2022   TSH 3.43 07/15/2022   PSA 1.25 08/12/2022   HGBA1C 10.4 Repeated and verified X2. (H) 07/15/2022   MICROALBUR 0.8 07/15/2022    DG Chest 2 View  Result Date: 03/08/2018 CLINICAL DATA:  Dry cough for 1 week EXAM: CHEST - 2 VIEW COMPARISON:  None. FINDINGS: Coarse interstitial opacity may reflect bronchitic changes. No consolidation or effusion. Normal heart size. No pneumothorax. IMPRESSION: 1. No focal pulmonary infiltrate. 2. Bronchitic changes. Electronically Signed   By: Jasmine Pang M.D.   On: 03/08/2018 15:30    Assessment & Plan:   Hyperlipidemia with target LDL less than 70- LDL goal achieved. Doing well on the statin  -     Lipid panel; Future -     CT CARDIAC SCORING (SELF PAY ONLY); Future  Prostate cancer screening -     PSA; Future  Stage 3a chronic kidney disease (HCC)- His renal function is stable.  Will continue the SGLT2 inhibitor. -     Basic metabolic panel; Future -     Dapagliflozin Propanediol; Take 1 tablet (10 mg total) by mouth daily.  Dispense: 90 tablet; Refill: 0  Type II diabetes mellitus with manifestations- Will gauge his risk for coronary artery disease with a coronary calcium score. -     Basic metabolic panel; Future -     Dapagliflozin Propanediol; Take 1 tablet (10 mg total) by mouth daily.  Dispense: 90 tablet; Refill: 0 -     CT CARDIAC SCORING (SELF PAY ONLY); Future     Follow-up: Return in about 4 months (around 12/12/2022).  Sanda Linger, MD

## 2022-08-13 ENCOUNTER — Other Ambulatory Visit: Payer: Self-pay | Admitting: Internal Medicine

## 2022-08-13 DIAGNOSIS — E118 Type 2 diabetes mellitus with unspecified complications: Secondary | ICD-10-CM

## 2022-08-13 MED ORDER — DAPAGLIFLOZIN PROPANEDIOL 10 MG PO TABS: 10.0000 mg | ORAL_TABLET | Freq: Every day | ORAL | 0 refills | Status: DC

## 2022-09-02 ENCOUNTER — Ambulatory Visit (HOSPITAL_COMMUNITY)
Admission: RE | Admit: 2022-09-02 | Discharge: 2022-09-02 | Disposition: A | Discharge status: Home / Self Care | Payer: No Typology Code available for payment source | Source: Ambulatory Visit | Attending: Internal Medicine | Admitting: Internal Medicine

## 2022-09-02 ENCOUNTER — Telehealth: Payer: Self-pay | Admitting: Internal Medicine

## 2022-09-02 DIAGNOSIS — E118 Type 2 diabetes mellitus with unspecified complications: Secondary | ICD-10-CM | POA: Insufficient documentation

## 2022-09-02 DIAGNOSIS — E785 Hyperlipidemia, unspecified: Secondary | ICD-10-CM | POA: Insufficient documentation

## 2022-09-02 NOTE — Addendum Note (Signed)
Addended by: Etta Grandchild on: 09/02/2022 05:45 PM   Modules accepted: Orders

## 2022-09-02 NOTE — Telephone Encounter (Signed)
Contacted Michael Ashley to schedule their annual wellness visit. Appointment made for 09/22/2022.  [facility] Care Guide [facility] AWV TEAM Direct Dial: [phone]

## 2022-09-07 ENCOUNTER — Other Ambulatory Visit: Payer: Self-pay | Admitting: Internal Medicine

## 2022-09-07 DIAGNOSIS — J849 Interstitial pulmonary disease, unspecified: Secondary | ICD-10-CM

## 2022-09-11 ENCOUNTER — Encounter: Payer: Self-pay | Admitting: Internal Medicine

## 2022-09-11 ENCOUNTER — Ambulatory Visit: Payer: No Typology Code available for payment source | Attending: Internal Medicine | Admitting: Internal Medicine

## 2022-09-11 ENCOUNTER — Other Ambulatory Visit: Payer: Self-pay | Admitting: Internal Medicine

## 2022-09-11 VITALS — BP 131/76 | HR 71 | Ht 69.0 in | Wt 203.8 lb

## 2022-09-11 DIAGNOSIS — R931 Abnormal findings on diagnostic imaging of heart and coronary circulation: Secondary | ICD-10-CM | POA: Diagnosis not present

## 2022-09-11 DIAGNOSIS — E118 Type 2 diabetes mellitus with unspecified complications: Secondary | ICD-10-CM

## 2022-09-11 DIAGNOSIS — E785 Hyperlipidemia, unspecified: Secondary | ICD-10-CM

## 2022-09-11 MED ORDER — ASPIRIN 81 MG PO TBEC: 81.0000 mg | DELAYED_RELEASE_TABLET | Freq: Every day | ORAL | 12 refills | Status: AC

## 2022-09-11 NOTE — Patient Instructions (Addendum)
Medication Instructions:  No changes *If you need a refill on your cardiac medications before your next appointment, please call your pharmacy*  Testing/Procedures: Your physician has requested that you have an echocardiogram. Echocardiography is a painless test that uses sound waves to create images of your heart. It provides your doctor with information about the size and shape of your heart and how well your heart's chambers and valves are working. This procedure takes approximately one hour. There are no restrictions for this procedure. Please do NOT wear cologne, perfume, aftershave, or lotions (deodorant is allowed). Please arrive 15 minutes prior to your appointment time.                         Patient Instructions for Exercise/Treadmill Stress Test  Medication instructions:  You can take all medications  Do not eat, drink or use tobacco products four hours prior to the test.  Water is ok.  3.  Dress prepared to exercise in a comfortable, two piece clothing outfit and walking shoes.  4.  Bring any current prescription medications with you the day of the test.  5.  Notify the office 24 hours in advance if you cannot keep this appointment.  6.  If you have any questions, please call [phone].    Follow-Up: At [facility], you and your health needs are our priority.  As part of our continuing mission to provide you with exceptional heart care, we have created designated Provider Care Teams.  These Care Teams include your primary Cardiologist (physician) and Advanced Practice Providers (APPs -  Physician Assistants and Nurse Practitioners) who all work together to provide you with the care you need, when you need it.  We recommend signing up for the patient portal called "MyChart".  Sign up information is provided on this After Visit Summary.  MyChart is used to connect with patients for Virtual Visits (Telemedicine).  Patients are able to view lab/test results, encounter  notes, upcoming appointments, etc.  Non-urgent messages can be sent to your provider as well.   To learn more about what you can do with MyChart, go to [email or web address].    Your next appointment:   6 month(s)  Provider:   Dr Wyline Mood

## 2022-09-11 NOTE — Progress Notes (Signed)
Cardiology Office Note:    Date:  09/11/2022   ID:  Michael Ashley, DOB 1950/11/27, MRN [id number]  PCP:  Etta Grandchild, MD   [name] Providers Cardiologist:  None     Referring MD: Etta Grandchild, MD   No chief complaint on file. CAC  History of Present Illness:    Michael Ashley is a 72 y.o. male with a hx of DM2,  findings suggestive of interstitial lung dx, CAC of 3977 which is 97th percentile. No prior recent cardiac stress test or cardiologist. He states he takes asa 81 mg daily.  He does not exercise. He does not augment his HR. No CP or SOB. No syncope. No orthopnea/PND. No LE edema. Blood pressure is well controlled. [place]  He smokes cigarettes sometimes. He likes to eat a lot of pasta. He is working on diet and BGs are ok. Dad passed of MI. Maternal uncles had MI  Past Medical History:  Diagnosis Date   Abnormality of gait    Diabetes mellitus without complication (HCC)    Diabetic neuropathy (HCC)    Hypertension    Lumbar radiculopathy    Spinal stenosis     Past Surgical History:  Procedure Laterality Date   NO PAST SURGERIES      Current Medications: Current Outpatient Medications on File Prior to Visit  Medication Sig Dispense Refill   atorvastatin (LIPITOR) 20 MG tablet Take 1 tablet by mouth once daily 90 tablet 0   Continuous Blood Gluc Receiver (FREESTYLE LIBRE 3 READER) DEVI 1 Act by Does not apply route daily. 1 each 3   Continuous Blood Gluc Sensor (FREESTYLE LIBRE 3 SENSOR) MISC 1 Act by Does not apply route daily. Place 1 sensor on the skin every 14 days. Use to check glucose continuously 2 each 5   dapagliflozin propanediol (FARXIGA) 10 MG TABS tablet Take 1 tablet (10 mg total) by mouth daily. 90 tablet 0   gabapentin (NEURONTIN) 300 MG capsule TAKE 1 CAPSULE BY MOUTH THREE TIMES DAILY 90 capsule 3   insulin glargine, 2 Unit Dial, (TOUJEO MAX SOLOSTAR) 300 UNIT/ML Solostar Pen Inject 30 Units into the skin daily. 9  mL 0   Insulin Pen Needle 32G X 6 MM MISC 1 Act by Does not apply route daily. 100 each 1   metFORMIN (GLUCOPHAGE-XR) 500 MG 24 hr tablet Take 1 tablet (500 mg total) by mouth daily with breakfast. 90 tablet 0   No current facility-administered medications on file prior to visit.     Allergies:   Lisinopril   Social History   Socioeconomic History   Marital status: Married    Spouse name: Not on file   Number of children: 3   Years of education: Not on file   Highest education level: Master's degree (e.g., MA, MS, MEng, MEd, MSW, MBA)  Occupational History   Not on file  Tobacco Use   Smoking status: Some Days    Types: Cigarettes   Smokeless tobacco: Never  Vaping Use   Vaping Use: Never used  Substance and Sexual Activity   Alcohol use: No    Alcohol/week: 0.0 standard drinks of alcohol   Drug use: No   Sexual activity: Not on file  Other Topics Concern   Not on file  Social History Narrative   Lives at home alone, his wife in [place]   Works at [employer]   Right handed   Caffeine: about 3-4 cups per  day   Social Determinants of Health   Financial Resource Strain: Low Risk  (11/21/2021)   Overall Financial Resource Strain (CARDIA)    Difficulty of Paying Living Expenses: Not hard at all  Food Insecurity: No Food Insecurity (11/21/2021)   Hunger Vital Sign    Worried About Running Out of Food in the Last Year: Never true    Ran Out of Food in the Last Year: Never true  Transportation Needs: No Transportation Needs (11/21/2021)   PRAPARE - Administrator, Civil Service (Medical): No    Lack of Transportation (Non-Medical): No  Physical Activity: Sufficiently Active (11/21/2021)   Exercise Vital Sign    Days of Exercise per Week: 5 days    Minutes of Exercise per Session: 30 min  Stress: No Stress Concern Present (11/21/2021)   [employer] of Occupational Health - Occupational Stress Questionnaire    Feeling of Stress : Not at all   Social Connections: Socially Integrated (11/21/2021)   Social Connection and Isolation Panel [NHANES]    Frequency of Communication with Friends and Family: More than three times a week    Frequency of Social Gatherings with Friends and Family: Once a week    Attends Religious Services: More than 4 times per year    Active Member of [employer] or Organizations: No    Attends Engineer, structural: More than 4 times per year    Marital Status: Married     Family History: The patient's family history includes Heart failure in his father; Kidney disease in his brother.  ROS:   Please see the history of present illness.     All other systems reviewed and are negative.  EKGs/Labs/Other Studies Reviewed:    The following studies were reviewed today:   EKG:  EKG is  ordered today.  The ekg ordered today demonstrates   09/11/2022- NSR  Recent Labs: 07/15/2022: ALT 19; Hemoglobin 15.1; Platelets 228.0; TSH 3.43 08/12/2022: BUN 23; Creatinine, Ser 1.44; Potassium 4.3; Sodium 141  Recent Lipid Panel    Component Value Date/Time   CHOL 114 08/12/2022 1334   TRIG 186.0 (H) 08/12/2022 1334   HDL 41.10 08/12/2022 1334   CHOLHDL 3 08/12/2022 1334   VLDL 37.2 08/12/2022 1334   LDLCALC 35 08/12/2022 1334   LDLDIRECT 84.0 10/10/2015 1004     Risk Assessment/Calculations:     Physical Exam:    VS:   Vitals:   09/11/22 0906  BP: 131/76  Pulse: 71  SpO2: 94%     Wt Readings from Last 3 Encounters:  08/12/22 205 lb (93 kg)  08/12/22 205 lb (93 kg)  07/15/22 203 lb (92.1 kg)     GEN:  Well nourished, well developed in no acute distress HEENT: Normal NECK: No JVD; No carotid bruits CARDIAC: RRR, no murmurs, rubs, gallops RESPIRATORY:  Clear to auscultation without rales, wheezing or rhonchi  ABDOMEN: Soft, non-tender, non-distended MUSCULOSKELETAL:  No edema; No deformity  SKIN: Warm and dry NEUROLOGIC:  Alert and oriented x 3 PSYCHIATRIC:  Normal affect   ASSESSMENT:    Elevated CAC: score is very high. He denies symptoms, but not active.  -LDL 35, continue lipitor 20 mg daily - continue asa 81 mg daily  Thoracic aneurysm: 4.4 cm, can repeat CT in 2025  DM2: poorly controlled A1c 10.4 07/15/2022; working to improve glycemic control. Was in good control before.  on farxiga 10 mg daily and insulin. A1c goal. <7  Stage 3a CKD: continue SGLT2  PLAN:    In order of problems listed above:   TTE Exercise Myoview SPECT Follow up in 6 months       Shared Decision Making/Informed Consent The risks [chest pain, shortness of breath, cardiac arrhythmias, dizziness, blood pressure fluctuations, myocardial infarction, stroke/transient ischemic attack, nausea, vomiting, allergic reaction, radiation exposure, metallic taste sensation and life-threatening complications (estimated to be 1 in 10,000)], benefits (risk stratification, diagnosing coronary artery disease, treatment guidance) and alternatives of a nuclear stress test were discussed in detail with Mr. Nottage and he agrees to proceed.    Medication Adjustments/Labs and Tests Ordered: Current medicines are reviewed at length with the patient today.  Concerns regarding medicines are outlined above.  No orders of the defined types were placed in this encounter.  No orders of the defined types were placed in this encounter.   There are no Patient Instructions on file for this visit.   Signed, Maisie Fus, MD  09/11/2022 8:05 AM    [facility]

## 2022-09-18 ENCOUNTER — Telehealth: Payer: Self-pay | Admitting: Internal Medicine

## 2022-09-18 NOTE — Telephone Encounter (Signed)
LVM on pt's phone to reschedule an Exercise stress test. Spoke with wife - stated husband is out of town, but will let him know to call back.   09-18-22, J.Britt

## 2022-09-18 NOTE — Addendum Note (Signed)
Addended by: Bea Laura B on: 09/18/2022 09:06 AM   Modules accepted: Orders

## 2022-09-22 ENCOUNTER — Ambulatory Visit (INDEPENDENT_AMBULATORY_CARE_PROVIDER_SITE_OTHER): Payer: No Typology Code available for payment source

## 2022-09-22 VITALS — BP 118/65 | HR 79 | Temp 97.6°F | Ht 69.0 in | Wt 201.2 lb

## 2022-09-22 DIAGNOSIS — Z0000 Encounter for general adult medical examination without abnormal findings: Secondary | ICD-10-CM

## 2022-09-22 NOTE — Patient Instructions (Signed)
[name] , Thank you for taking time to come for your Medicare Wellness Visit. I appreciate your ongoing commitment to your health goals. Please review the following plan we discussed and let me know if I can assist you in the future.   These are the goals we discussed:  Goals      CCM Expected Outcome:  Monitor, Self-Manage and Reduce Symptoms of Diabetes     Diabetes: Goal Setting The 7 areas that you can work on to improve your diabetes are:  Healthy Eating - Decrease portions, eat at regular times, add whole grains,  fruits and vegetables.  Being Active - Increase activity by walking, swimming or biking, take the  stairs, park farther away.  Monitoring - Check blood sugar, record results, keep a journal. Monitoring  can also include weight and blood pressure.  Taking Medicines - Take the right amount at the right time, learn how your  medicine works and what the side effects are.  Problem Solving - Take care of high and low blood sugars, sick days, know  who to call and when.  Reducing Risks - Stop smoking and start preventive care: get dilated eye  exams, foot care, flu and pneumonia shots, and dental care.  Healthy Coping - Know when to ask for help and who you can talk to when  you feel stressed or overwhelmed.  Pick something important to you and break your larger goals down into small  steps that you can really achieve! Caring for diabetes takes time and commitment, but it's worth it! Keep in mind  that change takes time. Once you choose a goal, break down the steps you will  take this week to reach that goal. Think of these as experiments, or things you  will try to see if they work. Think about why and what you will do differently  next week if something didn't work. Be patient with yourself and take it one day at a time.        This is a list of the screening recommended for you and due dates:  Health Maintenance  Topic Date Due   Eye exam for diabetics  07/04/2020    COVID-19 Vaccine (3 - 2023-24 season) 01/02/2022   Zoster (Shingles) Vaccine (1 of 2) 10/17/2022*   Flu Shot  12/03/2022   Hemoglobin A1C  01/15/2023   Yearly kidney health urinalysis for diabetes  07/15/2023   Complete foot exam   07/17/2023   Cologuard (Stool DNA test)  08/11/2023   Yearly kidney function blood test for diabetes  08/12/2023   DTaP/Tdap/Td vaccine (2 - Td or Tdap) 08/23/2023   Medicare Annual Wellness Visit  09/22/2023   Pneumonia Vaccine  Completed   Hepatitis C Screening: USPSTF Recommendation to screen - Ages 39-79 yo.  Completed   HPV Vaccine  Aged Out  *Topic was postponed. The date shown is not the original due date.    Advanced directives: No  Conditions/risks identified: Yes; Type II Diabetes  Next appointment: Follow up in one year for your annual wellness visit.   Preventive Care 91 Years and Older, Male  Preventive care refers to lifestyle choices and visits with your health care provider that can promote health and wellness. What does preventive care include? A yearly physical exam. This is also called an annual well check. Dental exams once or twice a year. Routine eye exams. Ask your health care provider how often you should have your eyes checked. Personal lifestyle choices, including:  Daily care of your teeth and gums. Regular physical activity. Eating a healthy diet. Avoiding tobacco and drug use. Limiting alcohol use. Practicing safe sex. Taking low doses of aspirin every day. Taking vitamin and mineral supplements as recommended by your health care provider. What happens during an annual well check? The services and screenings done by your health care provider during your annual well check will depend on your age, overall health, lifestyle risk factors, and family history of disease. Counseling  Your health care provider may ask you questions about your: Alcohol use. Tobacco use. Drug use. Emotional well-being. Home and relationship  well-being. Sexual activity. Eating habits. History of falls. Memory and ability to understand (cognition). Work and work Astronomer. Screening  You may have the following tests or measurements: Height, weight, and BMI. Blood pressure. Lipid and cholesterol levels. These may be checked every 5 years, or more frequently if you are over 49 years old. Skin check. Lung cancer screening. You may have this screening every year starting at age 89 if you have a 30-pack-year history of smoking and currently smoke or have quit within the past 15 years. Fecal occult blood test (FOBT) of the stool. You may have this test every year starting at age 72. Flexible sigmoidoscopy or colonoscopy. You may have a sigmoidoscopy every 5 years or a colonoscopy every 10 years starting at age 41. Prostate cancer screening. Recommendations will vary depending on your family history and other risks. Hepatitis C blood test. Hepatitis B blood test. Sexually transmitted disease (STD) testing. Diabetes screening. This is done by checking your blood sugar (glucose) after you have not eaten for a while (fasting). You may have this done every 1-3 years. Abdominal aortic aneurysm (AAA) screening. You may need this if you are a current or former smoker. Osteoporosis. You may be screened starting at age 58 if you are at high risk. Talk with your health care provider about your test results, treatment options, and if necessary, the need for more tests. Vaccines  Your health care provider may recommend certain vaccines, such as: Influenza vaccine. This is recommended every year. Tetanus, diphtheria, and acellular pertussis (Tdap, Td) vaccine. You may need a Td booster every 10 years. Zoster vaccine. You may need this after age 27. Pneumococcal 13-valent conjugate (PCV13) vaccine. One dose is recommended after age 35. Pneumococcal polysaccharide (PPSV23) vaccine. One dose is recommended after age 69. Talk to your health care  provider about which screenings and vaccines you need and how often you need them. This information is not intended to replace advice given to you by your health care provider. Make sure you discuss any questions you have with your health care provider. Document Released: 05/17/2015 Document Revised: 01/08/2016 Document Reviewed: 02/19/2015 Elsevier Interactive Patient Education  2017 [employer].  Fall Prevention in the Home Falls can cause injuries. They can happen to people of all ages. There are many things you can do to make your home safe and to help prevent falls. What can I do on the outside of my home? Regularly fix the edges of walkways and driveways and fix any cracks. Remove anything that might make you trip as you walk through a door, such as a raised step or threshold. Trim any bushes or trees on the path to your home. Use bright outdoor lighting. Clear any walking paths of anything that might make someone trip, such as rocks or tools. Regularly check to see if handrails are loose or broken. Make sure that both sides  of any steps have handrails. Any raised decks and porches should have guardrails on the edges. Have any leaves, snow, or ice cleared regularly. Use sand or salt on walking paths during winter. Clean up any spills in your garage right away. This includes oil or grease spills. What can I do in the bathroom? Use night lights. Install grab bars by the toilet and in the tub and shower. Do not use towel bars as grab bars. Use non-skid mats or decals in the tub or shower. If you need to sit down in the shower, use a plastic, non-slip stool. Keep the floor dry. Clean up any water that spills on the floor as soon as it happens. Remove soap buildup in the tub or shower regularly. Attach bath mats securely with double-sided non-slip rug tape. Do not have throw rugs and other things on the floor that can make you trip. What can I do in the bedroom? Use night lights. Make  sure that you have a light by your bed that is easy to reach. Do not use any sheets or blankets that are too big for your bed. They should not hang down onto the floor. Have a firm chair that has side arms. You can use this for support while you get dressed. Do not have throw rugs and other things on the floor that can make you trip. What can I do in the kitchen? Clean up any spills right away. Avoid walking on wet floors. Keep items that you use a lot in easy-to-reach places. If you need to reach something above you, use a strong step stool that has a grab bar. Keep electrical cords out of the way. Do not use floor polish or wax that makes floors slippery. If you must use wax, use non-skid floor wax. Do not have throw rugs and other things on the floor that can make you trip. What can I do with my stairs? Do not leave any items on the stairs. Make sure that there are handrails on both sides of the stairs and use them. Fix handrails that are broken or loose. Make sure that handrails are as long as the stairways. Check any carpeting to make sure that it is firmly attached to the stairs. Fix any carpet that is loose or worn. Avoid having throw rugs at the top or bottom of the stairs. If you do have throw rugs, attach them to the floor with carpet tape. Make sure that you have a light switch at the top of the stairs and the bottom of the stairs. If you do not have them, ask someone to add them for you. What else can I do to help prevent falls? Wear shoes that: Do not have high heels. Have rubber bottoms. Are comfortable and fit you well. Are closed at the toe. Do not wear sandals. If you use a stepladder: Make sure that it is fully opened. Do not climb a closed stepladder. Make sure that both sides of the stepladder are locked into place. Ask someone to hold it for you, if possible. Clearly mark and make sure that you can see: Any grab bars or handrails. First and last steps. Where the  edge of each step is. Use tools that help you move around (mobility aids) if they are needed. These include: Canes. Walkers. Scooters. Crutches. Turn on the lights when you go into a dark area. Replace any light bulbs as soon as they burn out. Set up your furniture so you have  a clear path. Avoid moving your furniture around. If any of your floors are uneven, fix them. If there are any pets around you, be aware of where they are. Review your medicines with your doctor. Some medicines can make you feel dizzy. This can increase your chance of falling. Ask your doctor what other things that you can do to help prevent falls. This information is not intended to replace advice given to you by your health care provider. Make sure you discuss any questions you have with your health care provider. Document Released: 02/14/2009 Document Revised: 09/26/2015 Document Reviewed: 05/25/2014 Elsevier Interactive Patient Education  2017 [employer].

## 2022-09-22 NOTE — Progress Notes (Signed)
Subjective:   Michael Ashley is a 72 y.o. male who presents for Medicare Annual/Subsequent preventive examination.  Review of Systems     Cardiac Risk Factors include: advanced age (>67men, >33 women)     Objective:    Today's Vitals   09/22/22 1015  Height: 5\' 9"  (1.753 m)   Body mass index is 30.1 kg/m.     11/21/2021    4:31 PM 12/17/2016   12:03 PM 10/13/2015   10:47 AM  Advanced Directives  Does Patient Have a Medical Advance Directive? No No Yes  Type of Best boy of [place];Living will  Does patient want to make changes to medical advance directive?   No - Patient declined  Copy of Healthcare Power of Attorney in Chart?   Yes  Would patient like information on creating a medical advance directive? No - Patient declined No - Patient declined     Current Medications (verified) Outpatient Encounter Medications as of 09/22/2022  Medication Sig   aspirin EC 81 MG tablet Take 1 tablet (81 mg total) by mouth daily. Swallow whole.   atorvastatin (LIPITOR) 20 MG tablet Take 1 tablet by mouth once daily   Continuous Blood Gluc Receiver (FREESTYLE LIBRE 3 READER) DEVI 1 Act by Does not apply route daily.   Continuous Blood Gluc Sensor (FREESTYLE LIBRE 3 SENSOR) MISC 1 Act by Does not apply route daily. Place 1 sensor on the skin every 14 days. Use to check glucose continuously   dapagliflozin propanediol (FARXIGA) 10 MG TABS tablet Take 1 tablet (10 mg total) by mouth daily.   gabapentin (NEURONTIN) 300 MG capsule TAKE 1 CAPSULE BY MOUTH THREE TIMES DAILY   insulin glargine, 2 Unit Dial, (TOUJEO MAX SOLOSTAR) 300 UNIT/ML Solostar Pen Inject 30 Units into the skin daily.   Insulin Pen Needle 32G X 6 MM MISC 1 Act by Does not apply route daily.   metFORMIN (GLUCOPHAGE-XR) 500 MG 24 hr tablet Take 1 tablet (500 mg total) by mouth daily with breakfast.   No facility-administered encounter medications on file as of 09/22/2022.    Allergies  (verified) Lisinopril   History: Past Medical History:  Diagnosis Date   Abnormality of gait    Diabetes mellitus without complication (HCC)    Diabetic neuropathy (HCC)    Hypertension    Lumbar radiculopathy    Spinal stenosis    Past Surgical History:  Procedure Laterality Date   NO PAST SURGERIES     Family History  Problem Relation Age of Onset   Heart failure Father    Kidney disease Brother    Social History   Socioeconomic History   Marital status: Married    Spouse name: Not on file   Number of children: 3   Years of education: Not on file   Highest education level: Master's degree (e.g., MA, MS, MEng, MEd, MSW, MBA)  Occupational History   Not on file  Tobacco Use   Smoking status: Some Days    Types: Cigarettes   Smokeless tobacco: Never  Vaping Use   Vaping Use: Never used  Substance and Sexual Activity   Alcohol use: No    Alcohol/week: 0.0 standard drinks of alcohol   Drug use: No   Sexual activity: Not on file  Other Topics Concern   Not on file  Social History Narrative   Lives at home alone, his [place] in [place]   Works at [employer]   Right handed  Caffeine: about 3-4 cups per day   Social Determinants of Health   Financial Resource Strain: Low Risk  (09/22/2022)   Overall Financial Resource Strain (CARDIA)    Difficulty of Paying Living Expenses: Not hard at all  Food Insecurity: No Food Insecurity (09/22/2022)   Hunger Vital Sign    Worried About Running Out of Food in the Last Year: Never true    Ran Out of Food in the Last Year: Never true  Transportation Needs: No Transportation Needs (09/22/2022)   PRAPARE - Administrator, Civil Service (Medical): No    Lack of Transportation (Non-Medical): No  Physical Activity: Sufficiently Active (09/22/2022)   Exercise Vital Sign    Days of Exercise per Week: 5 days    Minutes of Exercise per Session: 30 min  Stress: No Stress Concern Present (09/22/2022)   [employer] of Occupational Health - Occupational Stress Questionnaire    Feeling of Stress : Not at all  Social Connections: Socially Integrated (09/22/2022)   Social Connection and Isolation Panel [NHANES]    Frequency of Communication with Friends and Family: More than three times a week    Frequency of Social Gatherings with Friends and Family: Once a week    Attends Religious Services: More than 4 times per year    Active Member of [employer] or Organizations: No    Attends Engineer, structural: More than 4 times per year    Marital Status: Married    Tobacco Counseling Ready to quit: Not Answered Counseling given: Not Answered   Clinical Intake:  Pre-visit preparation completed: Yes  Pain : No/denies pain     Nutritional Risks: None Diabetes: No  How often do you need to have someone help you when you read instructions, pamphlets, or other written materials from your doctor or pharmacy?: 1 - Never What is the last grade level you completed in school?: HSG/Some College/College Graduate  Nutrition Risk Assessment:  Has the patient had any N/V/D within the last 2 months?  No  Does the patient have any non-healing wounds?  No  Has the patient had any unintentional weight loss or weight gain?  No   Diabetes:  Is the patient diabetic?  Yes  If diabetic, was a CBG obtained today?  No  Did the patient bring in their glucometer from home?  No  How often do you monitor your CBG's? Freestyle Libre.   Financial Strains and Diabetes Management:  Are you having any financial strains with the device, your supplies or your medication? No .  Does the patient want to be seen by Chronic Care Management for management of their diabetes?  No  Would the patient like to be referred to a Nutritionist or for Diabetic Management?  No   Diabetic Exams:  Diabetic Eye Exam: Overdue for diabetic eye exam. Pt has been advised about the importance in completing this exam. Patient advised  to call and schedule an eye exam. Diabetic Foot Exam: Completed 07/07/2022   Interpreter Needed?: No  Information entered by :: Michael Macdonnell N. Khyran Riera, LPN.   Activities of Daily Living    09/22/2022   10:18 AM 11/21/2021    4:33 PM  In your present state of health, do you have any difficulty performing the following activities:  Hearing? 0 0  Vision? 0 0  Difficulty concentrating or making decisions? 0 0  Walking or climbing stairs? 0 0  Dressing or bathing? 0 0  Doing errands, shopping? 0 0  Preparing Food and eating ? N N  Using the Toilet? N N  In the past six months, have you accidently leaked urine? N N  Do you have problems with loss of bowel control? N N  Managing your Medications? N N  Managing your Finances? N N  Housekeeping or managing your Housekeeping? N N    Patient Care Team: Etta Grandchild, MD as PCP - General (Internal Medicine)  Indicate any recent Medical Services you may have received from other than [facility] providers in the past year (date may be approximate).     Assessment:   This is a routine wellness examination for [place].  Hearing/Vision screen No results found.  Dietary issues and exercise activities discussed: Current Exercise Habits: Home exercise routine, Time (Minutes): 30, Frequency (Times/Week): 5, Weekly Exercise (Minutes/Week): 150, Intensity: Moderate   Goals Addressed   None   Depression Screen    09/22/2022   10:16 AM 08/12/2022    1:04 PM 07/15/2022    1:42 PM 11/21/2021    4:30 PM 05/30/2020   10:50 AM 11/08/2018   11:40 AM 03/08/2018    5:28 PM  PHQ 2/9 Scores  PHQ - 2 Score 0 0 0 0 0 0 0  PHQ- 9 Score 0          Fall Risk    09/22/2022   10:18 AM 08/12/2022    1:04 PM 07/15/2022    1:42 PM 11/21/2021    4:33 PM 05/30/2020   10:50 AM  Fall Risk   Falls in the past year? 1 0 0 0 0  Number falls in past yr: 0 0 0 0   Injury with Fall? 0 0 0 0   Risk for fall due to : No Fall Risks No Fall Risks No Fall Risks Impaired  balance/gait   Follow up Falls prevention discussed Falls evaluation completed Falls evaluation completed Falls prevention discussed     FALL RISK PREVENTION PERTAINING TO THE HOME:  Any stairs in or around the home? No  If so, are there any without handrails? No  Home free of loose throw rugs in walkways, pet beds, electrical cords, etc? Yes  Adequate lighting in your home to reduce risk of falls? Yes   ASSISTIVE DEVICES UTILIZED TO PREVENT FALLS:  Life alert? No  Use of a cane, walker or w/c? No  Grab bars in the bathroom? No  Shower chair or bench in shower? No  Elevated toilet seat or a handicapped toilet? No   TIMED UP AND GO:  Was the test performed? Yes .  Length of time to ambulate 10 feet: 10 sec.   Gait slow and steady without use of assistive device  Cognitive Function:        09/22/2022   10:18 AM 11/21/2021    4:35 PM  6CIT Screen  What Year? 0 points 0 points  What month? 0 points 0 points  What time? 0 points 0 points  Count back from 20 0 points 0 points  Months in reverse 0 points 0 points  Repeat phrase 0 points 0 points  Total Score 0 points 0 points    Immunizations Immunization History  Administered Date(s) Administered   Fluad Quad(high Dose 65+) 03/10/2019, 05/30/2020   Influenza, High Dose Seasonal PF 05/25/2016, 07/20/2017, 03/08/2018   PFIZER(Purple Top)SARS-COV-2 Vaccination 07/13/2019, 08/07/2019   Pneumococcal Conjugate-13 07/20/2017   Pneumococcal Polysaccharide-23 08/22/2013, 11/08/2018   Tdap 08/22/2013    TDAP status: Up to date  Flu  Vaccine status: Up to date  Pneumococcal vaccine status: Up to date  Covid-19 vaccine status: Completed vaccines  Qualifies for Shingles Vaccine? Yes   Zostavax completed No   Shingrix Completed?: No.    Education has been provided regarding the importance of this vaccine. Patient has been advised to call insurance company to determine out of pocket expense if they have not yet received this  vaccine. Advised may also receive vaccine at local pharmacy or Health Dept. Verbalized acceptance and understanding.  Screening Tests Health Maintenance  Topic Date Due   OPHTHALMOLOGY EXAM  07/04/2020   COVID-19 Vaccine (3 - 2023-24 season) 01/02/2022   Zoster Vaccines- Shingrix (1 of 2) 10/17/2022 (Originally 11/25/1998)   INFLUENZA VACCINE  12/03/2022   HEMOGLOBIN A1C  01/15/2023   Diabetic kidney evaluation - Urine ACR  07/15/2023   FOOT EXAM  07/17/2023   Fecal DNA (Cologuard)  08/11/2023   Diabetic kidney evaluation - eGFR measurement  08/12/2023   DTaP/Tdap/Td (2 - Td or Tdap) 08/23/2023   Medicare Annual Wellness (AWV)  09/22/2023   Pneumonia Vaccine 83+ Years old  Completed   Hepatitis C Screening  Completed   HPV VACCINES  Aged Out    Health Maintenance  Health Maintenance Due  Topic Date Due   OPHTHALMOLOGY EXAM  07/04/2020   COVID-19 Vaccine (3 - 2023-24 season) 01/02/2022    Colorectal cancer screening: Type of screening: Cologuard. Completed 08/10/2020. Repeat every 3 years  Lung Cancer Screening: (Low Dose CT Chest recommended if Age 60-80 years, 30 pack-year currently smoking OR have quit w/in 15years.) does qualify.   Lung Cancer Screening Referral: Yes; patient is scheduled.  Additional Screening:  Hepatitis C Screening: does qualify; Completed 12/15/2016  Vision Screening: Recommended annual ophthalmology exams for early detection of glaucoma and other disorders of the eye. Is the patient up to date with their annual eye exam?  No  Who is the provider or what is the name of the office in which the patient attends annual eye exams? None; patient will have retinal scan completed at pcp office on 11/18/2022 If pt is not established with a provider, would they like to be referred to a provider to establish care? No .   Dental Screening: Recommended annual dental exams for proper oral hygiene  Community Resource Referral / Chronic Care Management: CRR required  this visit?  No   CCM required this visit?  No      Plan:     I have personally reviewed and noted the following in the patient's chart:   Medical and social history Use of alcohol, tobacco or illicit drugs  Current medications and supplements including opioid prescriptions. Patient is not currently taking opioid prescriptions. Functional ability and status Nutritional status Physical activity Advanced directives List of other physicians Hospitalizations, surgeries, and ER visits in previous 12 months Vitals Screenings to include cognitive, depression, and falls Referrals and appointments  In addition, I have reviewed and discussed with patient certain preventive protocols, quality metrics, and best practice recommendations. A written personalized care plan for preventive services as well as general preventive health recommendations were provided to patient.     Mickeal Needy, LPN   1/61/0960   Nurse Notes:  Normal cognitive status assessed by direct observation by this Nurse Health Advisor. No abnormalities found.

## 2022-09-25 ENCOUNTER — Encounter (HOSPITAL_COMMUNITY): Payer: Self-pay

## 2022-09-29 NOTE — Progress Notes (Unsigned)
[name] [address] [place] [address] Phone: [phone] Subjective:   Bruce Donath, am serving as a scribe for Dr. Antoine Primas.  I'm seeing this patient by the request  of:  Etta Grandchild, MD  CC: Middle finger and index finger pain.  [id number]  08/12/2022 Discussed HEP  Discussed which activities to do  Worsening pain will consider injection       Update 09/30/2022 [place] Kawasaki is a 72 y.o. male coming in with complaint of middle finger f/u. Patient states that his finger hasn't changed and he is still in pain patient states that the middle finger has made some improvement but unfortunately it seems that the index finger is giving him more difficulty.  States that when he picks up his coffee cup does have more discomfort and pain.     Past Medical History:  Diagnosis Date   Abnormality of gait    Diabetes mellitus without complication (HCC)    Diabetic neuropathy (HCC)    Hypertension    Lumbar radiculopathy    Spinal stenosis    Past Surgical History:  Procedure Laterality Date   NO PAST SURGERIES     Social History   Socioeconomic History   Marital status: Married    Spouse name: Not on file   Number of children: 3   Years of education: Not on file   Highest education level: Master's degree (e.g., MA, MS, MEng, MEd, MSW, MBA)  Occupational History   Not on file  Tobacco Use   Smoking status: Some Days    Types: Cigarettes   Smokeless tobacco: Never  Vaping Use   Vaping Use: Never used  Substance and Sexual Activity   Alcohol use: No    Alcohol/week: 0.0 standard drinks of alcohol   Drug use: No   Sexual activity: Not on file  Other Topics Concern   Not on file  Social History Narrative   Lives at home alone, his [place] in the [place]   Works at [employer]   Right handed   Caffeine: about 3-4 cups per day   Social Determinants of Health   Financial Resource Strain: Low Risk  (09/22/2022)    Overall Financial Resource Strain (CARDIA)    Difficulty of Paying Living Expenses: Not hard at all  Food Insecurity: No Food Insecurity (09/22/2022)   Hunger Vital Sign    Worried About Running Out of Food in the Last Year: Never true    Ran Out of Food in the Last Year: Never true  Transportation Needs: No Transportation Needs (09/22/2022)   PRAPARE - Administrator, Civil Service (Medical): No    Lack of Transportation (Non-Medical): No  Physical Activity: Inactive (09/22/2022)   Exercise Vital Sign    Days of Exercise per Week: 0 days    Minutes of Exercise per Session: 0 min  Stress: No Stress Concern Present (09/22/2022)   [employer] of Occupational Health - Occupational Stress Questionnaire    Feeling of Stress : Not at all  Social Connections: Socially Integrated (09/22/2022)   Social Connection and Isolation Panel [NHANES]    Frequency of Communication with Friends and Family: More than three times a week    Frequency of Social Gatherings with Friends and Family: Once a week    Attends Religious Services: More than 4 times per year    Active Member of [employer] or Organizations: No    Attends Engineer, structural: More  than 4 times per year    Marital Status: Married   Allergies  Allergen Reactions   Lisinopril Cough   Family History  Problem Relation Age of Onset   Heart failure Father    Kidney disease Brother     Current Outpatient Medications (Endocrine & Metabolic):    dapagliflozin propanediol (FARXIGA) 10 MG TABS tablet, Take 1 tablet (10 mg total) by mouth daily.   insulin glargine, 2 Unit Dial, (TOUJEO MAX SOLOSTAR) 300 UNIT/ML Solostar Pen, Inject 30 Units into the skin daily.   metFORMIN (GLUCOPHAGE-XR) 500 MG 24 hr tablet, Take 1 tablet (500 mg total) by mouth daily with breakfast.  Current Outpatient Medications (Cardiovascular):    atorvastatin (LIPITOR) 20 MG tablet, Take 1 tablet by mouth once daily   Current Outpatient  Medications (Analgesics):    aspirin EC 81 MG tablet, Take 1 tablet (81 mg total) by mouth daily. Swallow whole.   Current Outpatient Medications (Other):    Continuous Blood Gluc Receiver (FREESTYLE LIBRE 3 READER) DEVI, 1 Act by Does not apply route daily.   Continuous Blood Gluc Sensor (FREESTYLE LIBRE 3 SENSOR) MISC, 1 Act by Does not apply route daily. Place 1 sensor on the skin every 14 days. Use to check glucose continuously   gabapentin (NEURONTIN) 300 MG capsule, TAKE 1 CAPSULE BY MOUTH THREE TIMES DAILY   Insulin Pen Needle 32G X 6 MM MISC, 1 Act by Does not apply route daily.   Reviewed prior external information including notes and imaging from  primary care provider As well as notes that were available from care everywhere and other healthcare systems.  Past medical history, social, surgical and family history all reviewed in electronic medical record.  No pertanent information unless stated regarding to the chief complaint.   Review of Systems:  No headache, visual changes, nausea, vomiting, diarrhea, constipation, dizziness, abdominal pain, skin rash, fevers, chills, night sweats, weight loss, swollen lymph nodes, body aches, joint swelling, chest pain, shortness of breath, mood changes. POSITIVE muscle aches  Objective  Blood pressure 106/72, pulse 76, height 5\' 9"  (1.753 m), weight 202 lb (91.6 kg), SpO2 96 %.   General: No apparent distress alert and oriented x3 mood and affect normal, dressed appropriately.  HEENT: Pupils equal, extraocular movements intact  Respiratory: Patient's speak in full sentences and does not appear short of breath  Cardiovascular: No lower extremity edema, non tender, no erythema  Hand exam shows that the right middle finger does still have some bony abnormalities noted of the PIP joint.  The patient also on the index finger does have some bony abnormality now of the PIP joint.  Limited muscular skeletal ultrasound was performed and  interpreted by Antoine Primas, M  Limited ultrasound shows that patient does have some calcific changes noted.  Mild underlying osteoarthritic changes but hypoechoic changes consistent with effusion also noted.    Impression and Recommendations:    The above documentation has been reviewed and is accurate and complete Judi Saa, DO

## 2022-09-30 ENCOUNTER — Other Ambulatory Visit: Payer: Self-pay

## 2022-09-30 ENCOUNTER — Encounter: Payer: Self-pay | Admitting: Family Medicine

## 2022-09-30 ENCOUNTER — Ambulatory Visit (INDEPENDENT_AMBULATORY_CARE_PROVIDER_SITE_OTHER): Payer: No Typology Code available for payment source | Admitting: Family Medicine

## 2022-09-30 VITALS — BP 106/72 | HR 76 | Ht 69.0 in | Wt 202.0 lb

## 2022-09-30 DIAGNOSIS — M2550 Pain in unspecified joint: Secondary | ICD-10-CM

## 2022-09-30 DIAGNOSIS — M79641 Pain in right hand: Secondary | ICD-10-CM | POA: Diagnosis not present

## 2022-09-30 LAB — CBC WITH DIFFERENTIAL/PLATELET
Basophils Absolute: 0 10*3/uL (ref 0.0–0.1)
Basophils Relative: 0.8 % (ref 0.0–3.0)
Eosinophils Absolute: 0.4 10*3/uL (ref 0.0–0.7)
Eosinophils Relative: 6.1 % — ABNORMAL HIGH (ref 0.0–5.0)
HCT: 41.9 % (ref 39.0–52.0)
Hemoglobin: 13.8 g/dL (ref 13.0–17.0)
Lymphocytes Relative: 17.7 % (ref 12.0–46.0)
Lymphs Abs: 1 10*3/uL (ref 0.7–4.0)
MCHC: 32.9 g/dL (ref 30.0–36.0)
MCV: 94 fl (ref 78.0–100.0)
Monocytes Absolute: 0.4 10*3/uL (ref 0.1–1.0)
Monocytes Relative: 6.4 % (ref 3.0–12.0)
Neutro Abs: 4 10*3/uL (ref 1.4–7.7)
Neutrophils Relative %: 69 % (ref 43.0–77.0)
Platelets: 218 10*3/uL (ref 150.0–400.0)
RBC: 4.45 Mil/uL (ref 4.22–5.81)
RDW: 13.8 % (ref 11.5–15.5)
WBC: 5.8 10*3/uL (ref 4.0–10.5)

## 2022-09-30 LAB — BASIC METABOLIC PANEL WITH GFR
BUN: 18 mg/dL (ref 6–23)
CO2: 27 meq/L (ref 19–32)
Calcium: 8.9 mg/dL (ref 8.4–10.5)
Chloride: 102 meq/L (ref 96–112)
Creatinine, Ser: 1.21 mg/dL (ref 0.40–1.50)
GFR: 59.26 mL/min — ABNORMAL LOW
Glucose, Bld: 202 mg/dL — ABNORMAL HIGH (ref 70–99)
Potassium: 3.7 meq/L (ref 3.5–5.1)
Sodium: 138 meq/L (ref 135–145)

## 2022-09-30 LAB — VITAMIN B12: Vitamin B-12: 403 pg/mL (ref 211–911)

## 2022-09-30 LAB — IBC PANEL
Iron: 116 ug/dL (ref 42–165)
Saturation Ratios: 42.9 % (ref 20.0–50.0)
TIBC: 270.2 ug/dL (ref 250.0–450.0)
Transferrin: 193 mg/dL — ABNORMAL LOW (ref 212.0–360.0)

## 2022-09-30 LAB — URIC ACID: Uric Acid, Serum: 5.7 mg/dL (ref 4.0–7.8)

## 2022-09-30 LAB — FERRITIN: Ferritin: 144 ng/mL (ref 22.0–322.0)

## 2022-09-30 NOTE — Patient Instructions (Signed)
Labs on the way out  Voltaren gel over areas of pain  Frog splint index finger for some time  7-8 week follow up

## 2022-09-30 NOTE — Assessment & Plan Note (Signed)
Likely continue to monitor.  Will get laboratory workup to make sure nothing else is potentially contributing to some of the discomfort and pain.  We discussed with patient about icing regimen and home exercises.  Discussed the brace to wear at the moment.  Will get patient to continue to do increase activity.  Discussed the potential for injections in the finger.  I do feel that the differential could be diabetic neuropathy but that it is only 1 hand.  Patient has no signs of any carpal tunnel but could consider the possibility of nerve conduction

## 2022-10-01 ENCOUNTER — Other Ambulatory Visit: Payer: Self-pay | Admitting: Internal Medicine

## 2022-10-01 DIAGNOSIS — E119 Type 2 diabetes mellitus without complications: Secondary | ICD-10-CM

## 2022-10-01 LAB — CALCIUM, IONIZED: Calcium, Ion: 4.8 mg/dL (ref 4.7–5.5)

## 2022-10-06 ENCOUNTER — Telehealth (HOSPITAL_COMMUNITY): Payer: Self-pay | Admitting: *Deleted

## 2022-10-06 ENCOUNTER — Other Ambulatory Visit: Payer: Self-pay | Admitting: Internal Medicine

## 2022-10-06 DIAGNOSIS — E119 Type 2 diabetes mellitus without complications: Secondary | ICD-10-CM

## 2022-10-06 NOTE — Telephone Encounter (Signed)
Calling patient to cancel patient's CCTA after review with the cardiologist.  Left message with name and call back number.  Larey Brick RN Navigator Cardiac Imaging [facility] [phone] Office [phone] Cell

## 2022-10-07 ENCOUNTER — Ambulatory Visit (INDEPENDENT_AMBULATORY_CARE_PROVIDER_SITE_OTHER): Payer: No Typology Code available for payment source | Admitting: Pulmonary Disease

## 2022-10-07 ENCOUNTER — Encounter: Payer: Self-pay | Admitting: Pulmonary Disease

## 2022-10-07 VITALS — BP 138/82 | HR 80 | Temp 97.8°F | Ht 69.5 in | Wt 205.4 lb

## 2022-10-07 DIAGNOSIS — J849 Interstitial pulmonary disease, unspecified: Secondary | ICD-10-CM

## 2022-10-07 NOTE — Progress Notes (Addendum)
Michael Ashley    [id number]    07-07-1950  Primary Care Physician:Jones, Bernadene Bell, MD  Referring Physician: Etta Grandchild, MD [address] [place],  [place] [address]  Chief complaint: Consult for interstitial lung disease  HPI: 72 y.o. who  has a past medical history of Abnormality of gait, Diabetes mellitus without complication (HCC), Diabetic neuropathy (HCC), Hypertension, Lumbar radiculopathy, and Spinal stenosis.   Referred here for abnormal cardiac CT which showed mild interstitial changes.  He denies any symptoms.  No cough, sputum production, dyspnea on exertion Does not have any joint pain, rash or difficulty swallowing, dry mouth, dry eyes  He is scheduled for a stress test of the heart later this month.  Pets: Used to have a dog Occupation: Worked in Community education officer and business Exposures: No mold, hot tub, Jacuzzi.  No feather pillows or comforters ILD questionnaire 10/07/2022-negative Smoking history: Smoked 5 cigarettes a day since 1968 Travel history: Originally [place]  Moved to the states in the 1980s.  No significant recent travel Relevant family history: No family history of lung disease   Outpatient Encounter Medications as of 10/07/2022  Medication Sig   aspirin EC 81 MG tablet Take 1 tablet (81 mg total) by mouth daily. Swallow whole.   atorvastatin (LIPITOR) 20 MG tablet Take 1 tablet by mouth once daily   Continuous Blood Gluc Receiver (FREESTYLE LIBRE 3 READER) DEVI 1 Act by Does not apply route daily.   Continuous Blood Gluc Sensor (FREESTYLE LIBRE 3 SENSOR) MISC 1 Act by Does not apply route daily. Place 1 sensor on the skin every 14 days. Use to check glucose continuously   dapagliflozin propanediol (FARXIGA) 10 MG TABS tablet Take 1 tablet (10 mg total) by mouth daily.   Insulin Pen Needle 32G X 6 MM MISC 1 Act by Does not apply route daily.   metFORMIN (GLUCOPHAGE-XR) 500 MG 24 hr tablet Take 1 tablet by mouth once daily with  breakfast   TOUJEO MAX SOLOSTAR 300 UNIT/ML Solostar Pen INJECT 30 UNITS SUBCUTANEOUSLY ONCE DAILY   gabapentin (NEURONTIN) 300 MG capsule TAKE 1 CAPSULE BY MOUTH THREE TIMES DAILY (Patient not taking: Reported on 10/07/2022)   No facility-administered encounter medications on file as of 10/07/2022.    Physical Exam: Blood pressure 138/82, pulse 80, temperature 97.8 F (36.6 C), temperature source Oral, height 5' 9.5" (1.765 m), weight 205 lb 6.4 oz (93.2 kg), SpO2 98 %. 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 I reviewed the images personally  PFTs:  Labs:  Assessment:  Evaluation for interstitial lung disease This was an incidental finding on a recent cardiac CT and he is asymptomatic There are no signs and symptoms of connective tissue disease or any exposure history  Visualized lungs on CT shows changes which appear to be in probable UIP  pattern on my review Will further investigate with baseline CTD serologies, high-res CT and PFTs Return to clinic in 2 to 3 months for review and plan for next steps.  Active smoker Will discuss smoking cessation on return visit.  Plan/Recommendations: High-res CT, PFTs CTD serologies  Chilton Greathouse MD [facility] 10/07/2022, 9:45 AM  CC: Etta Grandchild, MD

## 2022-10-07 NOTE — Patient Instructions (Signed)
Will get some labs today for further evaluation of your lung issues Will also schedule hydrogen CT and lung function test Follow-up in 3 months.

## 2022-10-08 ENCOUNTER — Ambulatory Visit (HOSPITAL_COMMUNITY): Admission: RE | Admit: 2022-10-08 | Payer: No Typology Code available for payment source | Source: Ambulatory Visit

## 2022-10-08 LAB — RHEUMATOID FACTOR: Rheumatoid fact SerPl-aCnc: <10 IU/mL (ref <14)

## 2022-10-09 LAB — CYCLIC CITRUL PEPTIDE ANTIBODY, IGG: Cyclic Citrullin Peptide Ab: <16 UNITS

## 2022-10-09 LAB — ANTI-DNA ANTIBODY, DOUBLE-STRANDED: ds DNA Ab: <1 IU/mL

## 2022-10-09 LAB — ANA: Anti Nuclear Antibody (ANA): NEGATIVE

## 2022-10-11 ENCOUNTER — Other Ambulatory Visit: Payer: Self-pay | Admitting: Internal Medicine

## 2022-10-11 DIAGNOSIS — E119 Type 2 diabetes mellitus without complications: Secondary | ICD-10-CM

## 2022-10-15 ENCOUNTER — Ambulatory Visit (HOSPITAL_COMMUNITY): Payer: No Typology Code available for payment source | Attending: Internal Medicine

## 2022-10-15 ENCOUNTER — Ambulatory Visit (HOSPITAL_COMMUNITY): Payer: No Typology Code available for payment source

## 2022-10-15 DIAGNOSIS — E119 Type 2 diabetes mellitus without complications: Secondary | ICD-10-CM | POA: Insufficient documentation

## 2022-10-15 DIAGNOSIS — I340 Nonrheumatic mitral (valve) insufficiency: Secondary | ICD-10-CM | POA: Diagnosis not present

## 2022-10-15 DIAGNOSIS — R931 Abnormal findings on diagnostic imaging of heart and coronary circulation: Secondary | ICD-10-CM

## 2022-10-15 DIAGNOSIS — I77810 Thoracic aortic ectasia: Secondary | ICD-10-CM

## 2022-10-15 DIAGNOSIS — I351 Nonrheumatic aortic (valve) insufficiency: Secondary | ICD-10-CM | POA: Diagnosis not present

## 2022-10-15 DIAGNOSIS — I10 Essential (primary) hypertension: Secondary | ICD-10-CM | POA: Diagnosis not present

## 2022-10-16 ENCOUNTER — Telehealth (HOSPITAL_COMMUNITY): Payer: Self-pay | Admitting: *Deleted

## 2022-10-16 LAB — ECHOCARDIOGRAM COMPLETE
Area-P 1/2: 2.69 cm2
S' Lateral: 2.7 cm

## 2022-10-16 NOTE — Telephone Encounter (Signed)
Left a detailed with instructions about a scheduled STRESS TEST on 10/20/22 at 7:30. PER DPR

## 2022-10-20 ENCOUNTER — Ambulatory Visit (HOSPITAL_COMMUNITY): Payer: No Typology Code available for payment source | Attending: Internal Medicine

## 2022-10-20 DIAGNOSIS — I2089 Other forms of angina pectoris: Secondary | ICD-10-CM | POA: Diagnosis not present

## 2022-10-20 DIAGNOSIS — R931 Abnormal findings on diagnostic imaging of heart and coronary circulation: Secondary | ICD-10-CM | POA: Insufficient documentation

## 2022-10-20 DIAGNOSIS — I501 Left ventricular failure: Secondary | ICD-10-CM | POA: Diagnosis not present

## 2022-10-20 LAB — MYOCARDIAL PERFUSION IMAGING
LV dias vol: 112 mL (ref 62–150)
LV sys vol: 57 mL
Nuc Stress EF: 49 %
Peak HR: 67 {beats}/min
Rest HR: 54 {beats}/min
Rest Nuclear Isotope Dose: 10.2 mCi
SDS: 1
SRS: 0
SSS: 1
ST Depression (mm): 0 mm
Stress Nuclear Isotope Dose: 30.2 mCi
TID: 1.09

## 2022-10-20 MED ORDER — TECHNETIUM TC 99M TETROFOSMIN IV KIT
10.2000 | PACK | Freq: Once | INTRAVENOUS | Status: AC | PRN
  Administered 2022-10-20: 10.2 via INTRAVENOUS

## 2022-10-20 MED ORDER — TECHNETIUM TC 99M TETROFOSMIN IV KIT
30.2000 | PACK | Freq: Once | INTRAVENOUS | Status: AC | PRN
  Administered 2022-10-20: 30.2 via INTRAVENOUS

## 2022-10-20 MED ORDER — REGADENOSON 0.4 MG/5ML IV SOLN
0.4000 mg | Freq: Once | INTRAVENOUS | Status: AC
Start: 2022-10-20 — End: 2022-10-20
  Administered 2022-10-20: 0.4 mg via INTRAVENOUS

## 2022-10-22 ENCOUNTER — Ambulatory Visit: Payer: No Typology Code available for payment source | Admitting: Internal Medicine

## 2022-11-04 ENCOUNTER — Ambulatory Visit
Admission: RE | Admit: 2022-11-04 | Discharge: 2022-11-04 | Disposition: A | Discharge status: Home / Self Care | Payer: No Typology Code available for payment source | Source: Ambulatory Visit | Attending: Pulmonary Disease | Admitting: Pulmonary Disease

## 2022-11-04 DIAGNOSIS — J8410 Pulmonary fibrosis, unspecified: Secondary | ICD-10-CM | POA: Diagnosis not present

## 2022-11-04 DIAGNOSIS — S2241XA Multiple fractures of ribs, right side, initial encounter for closed fracture: Secondary | ICD-10-CM | POA: Diagnosis not present

## 2022-11-04 DIAGNOSIS — J849 Interstitial pulmonary disease, unspecified: Secondary | ICD-10-CM

## 2022-11-04 DIAGNOSIS — I77810 Thoracic aortic ectasia: Secondary | ICD-10-CM | POA: Diagnosis not present

## 2022-11-04 DIAGNOSIS — I700 Atherosclerosis of aorta: Secondary | ICD-10-CM | POA: Diagnosis not present

## 2022-11-10 ENCOUNTER — Other Ambulatory Visit: Payer: Self-pay | Admitting: Internal Medicine

## 2022-11-10 DIAGNOSIS — E118 Type 2 diabetes mellitus with unspecified complications: Secondary | ICD-10-CM

## 2022-11-10 DIAGNOSIS — N1831 Chronic kidney disease, stage 3a: Secondary | ICD-10-CM

## 2022-11-11 ENCOUNTER — Ambulatory Visit: Payer: No Typology Code available for payment source | Admitting: Family Medicine

## 2022-11-16 ENCOUNTER — Ambulatory Visit: Payer: No Typology Code available for payment source | Admitting: Family Medicine

## 2022-12-10 ENCOUNTER — Ambulatory Visit: Payer: No Typology Code available for payment source | Admitting: Internal Medicine

## 2022-12-10 ENCOUNTER — Encounter: Payer: Self-pay | Admitting: Internal Medicine

## 2022-12-10 VITALS — BP 132/82 | HR 75 | Temp 98.0°F | Resp 16 | Ht 69.0 in | Wt 204.0 lb

## 2022-12-10 DIAGNOSIS — E119 Type 2 diabetes mellitus without complications: Secondary | ICD-10-CM

## 2022-12-10 DIAGNOSIS — E118 Type 2 diabetes mellitus with unspecified complications: Secondary | ICD-10-CM | POA: Diagnosis not present

## 2022-12-10 DIAGNOSIS — I10 Essential (primary) hypertension: Secondary | ICD-10-CM

## 2022-12-10 DIAGNOSIS — N1831 Chronic kidney disease, stage 3a: Secondary | ICD-10-CM

## 2022-12-10 DIAGNOSIS — Z794 Long term (current) use of insulin: Secondary | ICD-10-CM

## 2022-12-10 LAB — BASIC METABOLIC PANEL
BUN: 16 mg/dL (ref 6–23)
CO2: 31 mEq/L (ref 19–32)
Calcium: 9.5 mg/dL (ref 8.4–10.5)
Chloride: 99 mEq/L (ref 96–112)
Creatinine, Ser: 1.08 mg/dL (ref 0.40–1.50)
GFR: 67.83 mL/min (ref >60.00)
Glucose, Bld: 202 mg/dL — ABNORMAL HIGH (ref 70–99)
Potassium: 4 mEq/L (ref 3.5–5.1)
Sodium: 136 mEq/L (ref 135–145)

## 2022-12-10 LAB — HEMOGLOBIN A1C: Hgb A1c MFr Bld: 9.5 % — ABNORMAL HIGH (ref 4.6–6.5)

## 2022-12-10 MED ORDER — METFORMIN HCL ER 500 MG PO TB24
1000.0000 mg | ORAL_TABLET | Freq: Every day | ORAL | 1 refills | Status: DC
Start: 2022-12-10 — End: 2023-01-12

## 2022-12-10 MED ORDER — TOUJEO MAX SOLOSTAR 300 UNIT/ML ~~LOC~~ SOPN
50.0000 [IU] | PEN_INJECTOR | Freq: Every day | SUBCUTANEOUS | 1 refills | Status: DC
Start: 2022-12-10 — End: 2022-12-21

## 2022-12-10 NOTE — Progress Notes (Signed)
Subjective:  Patient ID: Michael Ashley, male    DOB: May 07, 1950  Age: 72 y.o. MRN: [id number]  CC: Hypertension and Diabetes   HPI Michael Ashley presents for f/up -   Discussed the use of AI scribe software for clinical note transcription with the patient, who gave verbal consent to proceed.  History of Present Illness   The patient, previously diagnosed with diabetes, presents with concerns about their elevated A1c levels. They have been adhering to a low carbohydrate diet and continue to take prescribed insulin. They monitor their blood sugar daily, which typically ranges between 120 and 150. Despite these elevated levels, they deny experiencing any classic diabetic symptoms such as polydipsia, polyuria, or significant changes in weight or appetite. They also deny any cardiopulmonary symptoms, specifically chest pain or shortness of breath. No other symptoms were reported.       Outpatient Medications Prior to Visit  Medication Sig Dispense Refill   aspirin EC 81 MG tablet Take 1 tablet (81 mg total) by mouth daily. Swallow whole. 30 tablet 12   atorvastatin (LIPITOR) 20 MG tablet Take 1 tablet by mouth once daily 90 tablet 0   Continuous Blood Gluc Receiver (FREESTYLE LIBRE 3 READER) DEVI 1 Act by Does not apply route daily. 1 each 3   Continuous Blood Gluc Sensor (FREESTYLE LIBRE 3 SENSOR) MISC 1 Act by Does not apply route daily. Place 1 sensor on the skin every 14 days. Use to check glucose continuously 2 each 5   FARXIGA 10 MG TABS tablet Take 1 tablet by mouth once daily 90 tablet 0   gabapentin (NEURONTIN) 300 MG capsule TAKE 1 CAPSULE BY MOUTH THREE TIMES DAILY 90 capsule 3   Insulin Pen Needle 32G X 6 MM MISC 1 Act by Does not apply route daily. 100 each 1   metFORMIN (GLUCOPHAGE-XR) 500 MG 24 hr tablet Take 1 tablet by mouth once daily with breakfast 90 tablet 0   TOUJEO MAX SOLOSTAR 300 UNIT/ML Solostar Pen INJECT 30 UNITS SUBCUTANEOUSLY ONCE DAILY 9 mL 0   No  facility-administered medications prior to visit.    ROS Review of Systems  Constitutional: Negative.  Negative for chills, diaphoresis, fatigue and fever.  HENT: Negative.  Negative for trouble swallowing.   Eyes: Negative.   Respiratory:  Negative for cough, chest tightness, shortness of breath and wheezing.   Cardiovascular:  Negative for chest pain, palpitations and leg swelling.  Gastrointestinal:  Negative for abdominal pain, constipation, diarrhea, nausea and vomiting.  Endocrine: Negative.   Genitourinary: Negative.   Musculoskeletal: Negative.   Skin: Negative.   Neurological: Negative.  Negative for dizziness, weakness and light-headedness.  Hematological:  Negative for adenopathy. Does not bruise/bleed easily.  Psychiatric/Behavioral: Negative.      Objective:  BP 132/82 (BP Location: Left Arm, Patient Position: Sitting, Cuff Size: Large)   Pulse 75   Temp 98 F (36.7 C) (Oral)   Resp 16   Ht 5\' 9"  (1.753 m)   Wt 204 lb (92.5 kg)   SpO2 92%   BMI 30.13 kg/m   BP Readings from Last 3 Encounters:  12/10/22 132/82  10/07/22 138/82  09/30/22 106/72    Wt Readings from Last 3 Encounters:  12/10/22 204 lb (92.5 kg)  10/20/22 205 lb (93 kg)  10/07/22 205 lb 6.4 oz (93.2 kg)    Physical Exam Vitals reviewed.  Constitutional:      Appearance: Normal appearance.  HENT:     Nose: Nose normal.  Mouth/Throat:     Mouth: Mucous membranes are moist.  Eyes:     General: No scleral icterus.    Conjunctiva/sclera: Conjunctivae normal.  Cardiovascular:     Rate and Rhythm: Normal rate and regular rhythm.     Heart sounds: No murmur heard. Pulmonary:     Effort: Pulmonary effort is normal.     Breath sounds: No stridor. No wheezing, rhonchi or rales.  Abdominal:     General: Abdomen is flat.     Palpations: There is no mass.     Tenderness: There is no abdominal tenderness. There is no guarding.     Hernia: No hernia is present.  Musculoskeletal:         General: Normal range of motion.     Cervical back: Neck supple.     Right lower leg: No edema.     Left lower leg: No edema.  Lymphadenopathy:     Cervical: No cervical adenopathy.  Skin:    General: Skin is warm and dry.  Neurological:     General: No focal deficit present.     Mental Status: He is alert. Mental status is at baseline.  Psychiatric:        Mood and Affect: Mood normal.        Behavior: Behavior normal.     Lab Results  Component Value Date   WBC 5.8 09/30/2022   HGB 13.8 09/30/2022   HCT 41.9 09/30/2022   PLT 218.0 09/30/2022   GLUCOSE 202 (H) 12/10/2022   CHOL 114 08/12/2022   TRIG 186.0 (H) 08/12/2022   HDL 41.10 08/12/2022   LDLDIRECT 84.0 10/10/2015   LDLCALC 35 08/12/2022   ALT 19 07/15/2022   AST 17 07/15/2022   NA 136 12/10/2022   K 4.0 12/10/2022   CL 99 12/10/2022   CREATININE 1.08 12/10/2022   BUN 16 12/10/2022   CO2 31 12/10/2022   TSH 3.43 07/15/2022   PSA 1.25 08/12/2022   HGBA1C 9.5 (H) 12/10/2022   MICROALBUR 0.8 07/15/2022    CT CHEST HIGH RESOLUTION  Result Date: 11/09/2022 CLINICAL DATA:  72 year old male with history of interstitial lung disease. EXAM: CT CHEST WITHOUT CONTRAST TECHNIQUE: Multidetector CT imaging of the chest was performed following the standard protocol without intravenous contrast. High resolution imaging of the lungs, as well as inspiratory and expiratory imaging, was performed. RADIATION DOSE REDUCTION: This exam was performed according to the departmental dose-optimization program which includes automated exposure control, adjustment of the mA and/or kV according to patient size and/or use of iterative reconstruction technique. COMPARISON:  Cardiac CT 09/02/2022. FINDINGS: Cardiovascular: Heart size is normal. There is no significant pericardial fluid, thickening or pericardial calcification. There is aortic atherosclerosis, as well as atherosclerosis of the great vessels of the mediastinum and the coronary  arteries, including calcified atherosclerotic plaque in the left main, left anterior descending, left circumflex and right coronary arteries. Ectasia of ascending thoracic aorta (4.4 cm in diameter). Mediastinum/Nodes: No pathologically enlarged mediastinal or hilar lymph nodes. Please note that accurate exclusion of hilar adenopathy is limited on noncontrast CT scans. Esophagus is unremarkable in appearance. No axillary lymphadenopathy. Lungs/Pleura: High-resolution images demonstrate patchy areas of peripheral predominant ground-glass attenuation, septal thickening, subpleural reticulation, mild cylindrical bronchiectasis and peripheral bronchiolectasis. These findings have a definitive craniocaudal gradient. No frank honeycombing. Inspiratory and expiratory imaging is unremarkable. No acute consolidative airspace disease. No pleural effusions. No definite suspicious appearing pulmonary nodules or masses are noted. Upper Abdomen: Aortic atherosclerosis. Numerous calcified gallstones  lying dependently in the gallbladder. Musculoskeletal: Acute nondisplaced fracture of the anterolateral right seventh rib (axial image 116 of series 2). Acute mildly displaced fracture of the lateral aspect of the right tenth rib (axial image 165 of series 2). There are no aggressive appearing lytic or blastic lesions noted in the visualized portions of the skeleton. IMPRESSION: 1. The appearance of the lungs is indicative of interstitial lung disease, with a spectrum of findings categorized as probable usual interstitial pneumonia (UIP) per current ATS guidelines. Repeat high-resolution chest CT should be considered in 12 months to assess for temporal changes in the appearance of the lung parenchyma. 2. Acute right-sided rib fractures, as above.  No pneumothorax. 3. Aortic atherosclerosis, in addition to left main and three-vessel coronary artery disease. Assessment for potential risk factor modification, dietary therapy or  pharmacologic therapy may be warranted, if clinically indicated. 4. Ectasia of ascending thoracic aorta (4.4 cm in diameter). Recommend annual imaging followup by CTA or MRA. This recommendation follows 2010 ACCF/AHA/AATS/ACR/ASA/SCA/SCAI/SIR/STS/SVM Guidelines for the Diagnosis and Management of Patients with Thoracic Aortic Disease. Circulation. 2010; 121: Z610-R604. Aortic aneurysm NOS (ICD10-I71.9). 5. Cholelithiasis. Aortic Atherosclerosis (ICD10-I70.0). Electronically Signed   By: Trudie Reed M.D.   On: 11/09/2022 11:03    Assessment & Plan:   Stage 3a chronic kidney disease (HCC)- Renal function has improved. Will continue the SGLT-2 inh. -     Basic metabolic panel; Future  Type II diabetes mellitus with manifestations (HCC)- A1C remains too high. Will increase the basal insulin dose. -     Hemoglobin A1c; Future -     Basic metabolic panel; Future -     Ambulatory referral to Ophthalmology  Essential hypertension, benign- His BP is well controlled. -     Basic metabolic panel; Future     Follow-up: Return in about 4 months (around 04/11/2023).  Sanda Linger, MD

## 2022-12-10 NOTE — Patient Instructions (Signed)
Chronic Kidney Disease, Adult Chronic kidney disease (CKD) occurs when the kidneys are slowly and permanently damaged over a long period of time. The kidneys are a pair of organs that do many important jobs in the body, including: Removing waste and extra fluid from the blood to make urine. Making hormones that maintain the amount of fluid in tissues and blood vessels. Maintaining the right amount of fluids and chemicals in the body. A small amount of kidney damage may not cause problems, but a large amount of damage may make it hard or impossible for the kidneys to work right. Steps must be taken to slow kidney damage or to stop it from getting worse. If steps are not taken, the kidneys may stop working permanently (end-stage renal disease, or ESRD). Most of the time, CKD does not go away, but it can often be controlled. People who have CKD are usually able to live full lives. What are the causes? The most common causes of this condition are diabetes and high blood pressure (hypertension). Other causes include: Cardiovascular diseases. These affect the heart and blood vessels. Kidney diseases. These include: Glomerulonephritis, or inflammation of the tiny filters in the kidneys. Interstitial nephritis. This is swelling of the small tubes of the kidneys and of the surrounding structures. Polycystic kidney disease, in which clusters of fluid-filled sacs form within the kidneys. Renal vascular disease. This includes disorders that affect the arteries and veins of the kidneys. Diseases that affect the body's defense system (immune system). A problem with urine flow. This may be caused by: Kidney stones. Cancer. An enlarged prostate, in males. A kidney infection or urinary tract infection (UTI) that keeps coming back. Vasculitis. This is swelling or inflammation of the blood vessels. What increases the risk? Your chances of having kidney disease increase with age. The following factors may make  you more likely to develop this condition: A family history of kidney disease or kidney failure. Kidney failure means the kidneys can no longer work right. Certain genetic diseases. Taking medicines often that are damaging to the kidneys. Being around or being in contact with toxic substances. Obesity. A history of tobacco use. What are the signs or symptoms? Symptoms of this condition include: Feeling very tired (lethargic) and having less energy. Swelling, or edema, of the face, legs, ankles, or feet. Nausea or vomiting, or loss of appetite. Confusion or trouble concentrating. Muscle twitches and cramps, especially in the legs. Dry, itchy skin. A metallic taste in the mouth. Producing less urine, or producing more urine (especially at night). Shortness of breath. Trouble sleeping. CKD may also result in not having enough red blood cells or hemoglobin in the blood (anemia) or having weak bones (bone disease). Symptoms develop slowly and may not be obvious until the kidney damage becomes severe. It is possible to have kidney disease for years without having symptoms. How is this diagnosed? This condition may be diagnosed based on: Blood tests. Urine tests. Imaging tests, such as an ultrasound or a CT scan. A kidney biopsy. This involves removing a sample of kidney tissue to be looked at under a microscope. Results from these tests will help to determine how serious the CKD is. How is this treated? There is no cure for most cases of this condition, but treatment usually relieves symptoms and prevents or slows the worsening of the disease. Treatment may include: Diet changes, which may require you to avoid alcohol and foods that are high in salt, potassium, phosphorous, and protein. Medicines. These may:  Lower blood pressure. Control blood sugar (glucose). Relieve anemia. Relieve swelling. Protect your bones. Improve the balance of salts and minerals in your blood  (electrolytes). Dialysis, which is a type of treatment that removes toxic waste from the body. It may be needed if you have kidney failure. Managing any other conditions that are causing your CKD or making it worse. Follow these instructions at home: Medicines Take over-the-counter and prescription medicines only as told by your health care provider. The amount of some medicines that you take may need to be changed. Do not take any new medicines unless approved by your health care provider. Many medicines can make kidney damage worse. Do not take any vitamin and mineral supplements unless approved by your health care provider. Many nutritional supplements can make kidney damage worse. Lifestyle  Do not use any products that contain nicotine or tobacco, such as cigarettes, e-cigarettes, and chewing tobacco. If you need help quitting, ask your health care provider. If you drink alcohol: Limit how much you use to: 0-1 drink a day for women who are not pregnant. 0-2 drinks a day for men. Know how much alcohol is in your drink. In the [place] one drink equals one 12 oz bottle of beer (355 mL), one 5 oz glass of wine (148 mL), or one 1 oz glass of hard liquor (44 mL). Maintain a healthy weight. If you need help, ask your health care provider. General instructions  Follow instructions from your health care provider about eating or drinking restrictions, including any prescribed diet. Track your blood pressure at home. Report changes in your blood pressure as told. If you are being treated for diabetes, track your blood glucose levels as told. Start or continue an exercise plan. Exercise at least 30 minutes a day, 5 days a week. Keep your immunizations up to date as told. Keep all follow-up visits. This is important. Where to find more information American Association of Kidney Patients: Residential[employer].is [employer]: www.kidney.org American Kidney Fund: FightingMatch.com.ee Life Options:  www.lifeoptions.org Kidney School: www.kidneyschool.org Contact a health care provider if: Your symptoms get worse. You develop new symptoms. Get help right away if: You develop symptoms of ESRD. These include: Headaches. Numbness in your hands or feet. Easy bruising. Frequent hiccups. Chest pain. Shortness of breath. Lack of menstrual periods, in women. You have a fever. You are producing less urine than usual. You have pain or bleeding when you urinate or when you have a bowel movement. These symptoms may represent a serious problem that is an emergency. Do not wait to see if the symptoms will go away. Get medical help right away. Call your local emergency services (911 in the [place] Do not drive yourself to the hospital. Summary Chronic kidney disease (CKD) occurs when the kidneys become damaged slowly over a long period of time. The most common causes of this condition are diabetes and high blood pressure (hypertension). There is no cure for most cases of CKD, but treatment usually relieves symptoms and prevents or slows the worsening of the disease. Treatment may include a combination of lifestyle changes, medicines, and dialysis. This information is not intended to replace advice given to you by your health care provider. Make sure you discuss any questions you have with your health care provider. Document Revised: 07/21/2019 Document Reviewed: 07/26/2019 Elsevier Patient Education  2024 [employer].

## 2022-12-14 ENCOUNTER — Telehealth: Payer: Self-pay | Admitting: Internal Medicine

## 2022-12-14 ENCOUNTER — Other Ambulatory Visit: Payer: Self-pay | Admitting: Internal Medicine

## 2022-12-14 DIAGNOSIS — E118 Type 2 diabetes mellitus with unspecified complications: Secondary | ICD-10-CM

## 2022-12-14 DIAGNOSIS — E785 Hyperlipidemia, unspecified: Secondary | ICD-10-CM

## 2022-12-14 MED ORDER — ATORVASTATIN CALCIUM 20 MG PO TABS
20.0000 mg | ORAL_TABLET | Freq: Every day | ORAL | 1 refills | Status: DC
Start: 2022-12-14 — End: 2023-06-11

## 2022-12-14 NOTE — Telephone Encounter (Signed)
Prescription Request  12/14/2022  LOV: 12/10/2022  What is the name of the medication or equipment? atorvastatin (LIPITOR) 20 MG tablet   Have you contacted your pharmacy to request a refill? No   Which pharmacy would you like this sent to?  [id number] - [place] ([place]), [place] - [address]  ([place]) [place] [address] Phone: [phone] Fax: [phone]    Patient notified that their request is being sent to the clinical staff for review and that they should receive a response within 2 business days.   Please advise at Mobile [phone] (mobile)

## 2022-12-15 ENCOUNTER — Telehealth: Payer: Self-pay

## 2022-12-15 NOTE — Progress Notes (Signed)
   12/15/2022  Patient ID: Michael Ashley, male   DOB: 08/20/1950, 72 y.o.   MRN: [id number]  Outreach attempt to schedule telephone visit for medication review at the request of Dr. Yetta Barre.  Recent changes have been made to his diabetes medication regimen.  I was not able to reach the patient but left HIPAA compliant voicemail with my direct phone number.  I am also sending a MyChart message.  Lenna Gilford, PharmD, DPLA

## 2022-12-21 ENCOUNTER — Other Ambulatory Visit: Payer: Self-pay | Admitting: Internal Medicine

## 2022-12-21 DIAGNOSIS — E119 Type 2 diabetes mellitus without complications: Secondary | ICD-10-CM

## 2022-12-24 ENCOUNTER — Other Ambulatory Visit: Payer: No Typology Code available for payment source

## 2022-12-24 ENCOUNTER — Ambulatory Visit: Payer: No Typology Code available for payment source | Admitting: Family Medicine

## 2022-12-24 NOTE — Progress Notes (Signed)
   12/24/2022  Patient ID: Michael Ashley, male   DOB: [birth year], 72 y.o.   MRN: [id number]  Outreach attempt for scheduled telephone visit for medication review specific to diabetes management per clinic routed request from patient's PCP, Dr. Yetta Barre, was unsuccessful.  Called x3 and each time it went straight to voicemail.  Left HIPAA compliant voicemail with my direct number for patient to return my call.  I am also sending a MyChart message to attempt to reschedule visit.  Lenna Gilford, PharmD, DPLA

## 2022-12-25 ENCOUNTER — Other Ambulatory Visit: Payer: Self-pay | Admitting: Internal Medicine

## 2022-12-25 DIAGNOSIS — E119 Type 2 diabetes mellitus without complications: Secondary | ICD-10-CM

## 2023-01-01 ENCOUNTER — Other Ambulatory Visit: Payer: No Typology Code available for payment source

## 2023-01-01 DIAGNOSIS — E119 Type 2 diabetes mellitus without complications: Secondary | ICD-10-CM

## 2023-01-01 NOTE — Progress Notes (Signed)
01/01/2023 Name: Michael Ashley MRN: [id number] DOB: 12/25/1950  Chief Complaint  Patient presents with   Medication Management   Michael Ashley is a 72 y.o. year old male who presented for a telephone visit.   They were referred to the pharmacist by their PCP for assistance in managing diabetes.    Subjective:  Care Team: Primary Care Provider: Etta Grandchild, MD   Medication Access/Adherence  Current Pharmacy:  [facility] - [address] ([place]), [place] - [address] [place] ([place]) [place] [address] Phone: [phone] Fax: [phone]  -Patient reports affordability concerns with their medications: No  -Patient reports access/transportation concerns to their pharmacy: No  -Patient reports adherence concerns with their medications:  No    Diabetes: Current medications: Farxiga 10mg  daily, metformin xr 1000mg  daily, Toujeo Max 30 units daily -Current glucose readings: FBG ranges 105-180 depending on meal/snacks evening prior, but patient states it average 110-120 -He is prescribed Libre 3 CGM and has this at home but is not using.  Continues to check FBG daily with glucometer/strips/lancets -Patient denies hypoglycemic s/sx including dizziness, shakiness, sweating.  -Patient denies hyperglycemic symptoms including polyuria, polydipsia, polyphagia, nocturia, neuropathy, blurred vision. -States [name] is covered on insurance and affordable to him  Objective: Lab Results  Component Value Date   HGBA1C 9.5 (H) 12/10/2022   Lab Results  Component Value Date   CREATININE 1.08 12/10/2022   BUN 16 12/10/2022   NA 136 12/10/2022   K 4.0 12/10/2022   CL 99 12/10/2022   CO2 31 12/10/2022   Medications Reviewed Today     Reviewed by Lenna Gilford, RPH (Pharmacist) on 01/01/23 at [phone]  Med List Status: <None>   Medication Order Taking? Sig Documenting Provider Last Dose Status Informant  aspirin EC 81 MG tablet [id number] Yes Take 1 tablet  (81 mg total) by mouth daily. Swallow whole. Maisie Fus, MD Taking Active   atorvastatin (LIPITOR) 20 MG tablet [id number] Yes Take 1 tablet (20 mg total) by mouth daily. Etta Grandchild, MD Taking Active   Continuous Blood Gluc Receiver (FREESTYLE LIBRE 3 READER) DEVI [id number] No 1 Act by Does not apply route daily.  Patient not taking: Reported on 01/01/2023   Etta Grandchild, MD Not Taking Active   Continuous Blood Gluc Sensor (FREESTYLE LIBRE 3 SENSOR) [place] [id number] No 1 Act by Does not apply route daily. Place 1 sensor on the skin every 14 days. Use to check glucose continuously  Patient not taking: Reported on 01/01/2023   Etta Grandchild, MD Not Taking Active   FARXIGA 10 MG TABS tablet [id number] Yes Take 1 tablet by mouth once daily Etta Grandchild, MD Taking Active   Insulin Pen Needle 32G X 6 MM MISC [id number] Yes 1 Act by Does not apply route daily. Etta Grandchild, MD Taking Active   metFORMIN (GLUCOPHAGE-XR) 500 MG 24 hr tablet [id number] Yes Take 2 tablets (1,000 mg total) by mouth daily with breakfast. Etta Grandchild, MD Taking Active   TOUJEO MAX SOLOSTAR 300 UNIT/ML Solostar Pen [id number] Yes INJECT 30 UNITS SUBCUTANEOUSLY ONCE DAILY Etta Grandchild, MD Taking Active            Assessment/Plan:   Diabetes: - Currently uncontrolled - Reviewed goal A1c, goal fasting, and goal 2 hour post prandial glucose - Reviewed dietary modifications including elimination drinks with added sugar, decreasing carbohydrate and sugar intake, and increasing lean proteins - Reviewed lifestyle modifications including: increasing activity level -  Recommend to increase metformin to 1000mg  xr BID, which patient agrees to and endorses tolerating medication well - He will start using Libre 3 for CGM and will document FBG and 2hr post-prandial values  Follow Up Plan: 10/2 to assess any BG changes with increased metformin dosing and lifestyle modifications.  Can discuss increasing Toujeo if needed  at this time and/or GLP1 therapy  Lenna Gilford, PharmD, DPLA

## 2023-01-07 ENCOUNTER — Encounter: Payer: Self-pay | Admitting: Pulmonary Disease

## 2023-01-07 ENCOUNTER — Ambulatory Visit: Payer: No Typology Code available for payment source | Admitting: Pulmonary Disease

## 2023-01-07 VITALS — BP 120/74 | HR 94 | Temp 97.3°F | Ht 69.0 in | Wt 203.0 lb

## 2023-01-07 DIAGNOSIS — F17210 Nicotine dependence, cigarettes, uncomplicated: Secondary | ICD-10-CM

## 2023-01-07 DIAGNOSIS — J849 Interstitial pulmonary disease, unspecified: Secondary | ICD-10-CM

## 2023-01-07 LAB — PULMONARY FUNCTION TEST
DL/VA % pred: 82 %
DL/VA: 3.28 ml/min/mmHg/L
DLCO cor % pred: 75 %
DLCO cor: 18.4 ml/min/mmHg
DLCO unc % pred: 75 %
DLCO unc: 18.4 ml/min/mmHg
FEF 25-75 Post: 0.83 L/sec
FEF 25-75 Pre: 0.92 L/sec
FEF2575-%Change-Post: -9 %
FEF2575-%Pred-Post: 38 %
FEF2575-%Pred-Pre: 42 %
FEV1-%Change-Post: -5 %
FEV1-%Pred-Post: 47 %
FEV1-%Pred-Pre: 50 %
FEV1-Post: 1.41 L
FEV1-Pre: 1.5 L
FEV1FVC-%Change-Post: -6 %
FEV1FVC-%Pred-Pre: 54 %
FEV6-%Change-Post: -2 %
FEV6-%Pred-Post: 96 %
FEV6-%Pred-Pre: 98 %
FEV6-Post: 3.72 L
FEV6-Pre: 3.81 L
FEV6FVC-%Change-Post: -1 %
FEV6FVC-%Pred-Post: 105 %
FEV6FVC-%Pred-Pre: 106 %
FVC-%Change-Post: 0 %
FVC-%Pred-Post: 93 %
FVC-%Pred-Pre: 92 %
FVC-Post: 3.84 L
FVC-Pre: 3.81 L
Post FEV1/FVC ratio: 37 %
Post FEV6/FVC ratio: 99 %
Pre FEV1/FVC ratio: 39 %
Pre FEV6/FVC Ratio: 100 %
RV % pred: 77 %
RV: 1.93 L
TLC % pred: 83 %
TLC: 5.73 L

## 2023-01-07 NOTE — Patient Instructions (Signed)
We reviewed your CT scan today that does show some scarring in the lung.  A condition called interstitial lung disease or pulmonary fibrosis We discussed treatment options and have decided to monitor for now Will order high-res CT and PFTs in 10 months Return to clinic in 10 months after these tests

## 2023-01-07 NOTE — Patient Instructions (Signed)
Full PFT performed today. °

## 2023-01-07 NOTE — Progress Notes (Signed)
Michael Ashley    [id number]    16-Aug-1950  Primary Care Physician:Jones, Bernadene Bell, MD  Referring Physician: Etta Grandchild, MD [address] [place],  [place] [address]  Chief complaint: Follow up for interstitial lung disease  HPI: 72 y.o. who  has a past medical history of Abnormality of gait, Diabetes mellitus without complication (HCC), Diabetic neuropathy (HCC), Hypertension, Lumbar radiculopathy, and Spinal stenosis.   Referred here for abnormal cardiac CT which showed mild interstitial changes.  He denies any symptoms.  No cough, sputum production, dyspnea on exertion Does not have any joint pain, rash or difficulty swallowing, dry mouth, dry eyes  He is scheduled for a stress test of the heart later this month.  Pets: Used to have a dog Occupation: Worked in Community education officer and business Exposures: No mold, hot tub, Jacuzzi.  No feather pillows or comforters ILD questionnaire 10/07/2022-negative Smoking history: Smoked 5 cigarettes a day since 1968 Travel history: Originally [place]  Moved to the states in the 1980s.  No significant recent travel Relevant family history: No family history of lung disease  Interim history: Here for review of CT scan and PFTs States that breathing is doing well with no issues.  Outpatient Encounter Medications as of 01/07/2023  Medication Sig   aspirin EC 81 MG tablet Take 1 tablet (81 mg total) by mouth daily. Swallow whole.   atorvastatin (LIPITOR) 20 MG tablet Take 1 tablet (20 mg total) by mouth daily.   Continuous Blood Gluc Receiver (FREESTYLE LIBRE 3 READER) DEVI 1 Act by Does not apply route daily.   Continuous Blood Gluc Sensor (FREESTYLE LIBRE 3 SENSOR) MISC 1 Act by Does not apply route daily. Place 1 sensor on the skin every 14 days. Use to check glucose continuously   FARXIGA 10 MG TABS tablet Take 1 tablet by mouth once daily   Insulin Pen Needle 32G X 6 MM MISC 1 Act by Does not apply route daily.   metFORMIN  (GLUCOPHAGE-XR) 500 MG 24 hr tablet Take 2 tablets (1,000 mg total) by mouth daily with breakfast.   TOUJEO MAX SOLOSTAR 300 UNIT/ML Solostar Pen INJECT 30 UNITS SUBCUTANEOUSLY ONCE DAILY   No facility-administered encounter medications on file as of 01/07/2023.    Physical Exam: Blood pressure 138/82, pulse 80, temperature 97.8 F (36.6 C), temperature source Oral, height 5' 9.5" (1.765 m), weight 205 lb 6.4 oz (93.2 kg), SpO2 98 %. 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  High resolution CT 11/04/2022-interstitial lung disease with patchy areas of groundglass, septal thickening, mild cylindrical bronchiectasis.  Probable UIP pattern. I reviewed the images personally  PFTs: 01/07/2023 FVC 3.84 [93%], FEV1 1.41 [47%], F/F37, TLC 5.73 [83%], DLCO 18.40 [95% Severe obstruction.  Overall spirometry is unreliable due to poor expiratory effort Mild diffusion defect  Labs: CTD  serology 10/07/2022-negative  Assessment:  Evaluation for interstitial lung disease CT shows mild changes in probable UIP pattern. There are no signs and symptoms of connective tissue disease or any exposure history.  CTD serologies are negative  We discussed further workup and possible antifibrotic treatment but he says that he feels well and would like to monitor for now. Get follow-up high-res CT and PFTs in 10 months.  Active smoker Discussed smoking cessation.  He wants to  quit on his own Reassess at return visit.  Time spent counseling-5 minutes  PFT shows severe obstruction but may be over estimated due to patient effort.  Plan/Recommendations: High-res CT, PFTs in 10 months Smoking cessation  Chilton Greathouse MD [facility] 01/07/2023, 11:11 AM  CC: Etta Grandchild, MD

## 2023-01-07 NOTE — Progress Notes (Signed)
Full PFT performed today. °

## 2023-01-11 DIAGNOSIS — H35013 Changes in retinal vascular appearance, bilateral: Secondary | ICD-10-CM | POA: Diagnosis not present

## 2023-01-11 DIAGNOSIS — H25813 Combined forms of age-related cataract, bilateral: Secondary | ICD-10-CM | POA: Diagnosis not present

## 2023-01-11 DIAGNOSIS — H35362 Drusen (degenerative) of macula, left eye: Secondary | ICD-10-CM | POA: Diagnosis not present

## 2023-01-11 DIAGNOSIS — E119 Type 2 diabetes mellitus without complications: Secondary | ICD-10-CM | POA: Diagnosis not present

## 2023-01-11 LAB — HM DIABETES EYE EXAM

## 2023-01-12 ENCOUNTER — Other Ambulatory Visit: Payer: Self-pay | Admitting: Internal Medicine

## 2023-01-12 DIAGNOSIS — E119 Type 2 diabetes mellitus without complications: Secondary | ICD-10-CM

## 2023-01-12 MED ORDER — METFORMIN HCL ER 500 MG PO TB24: 1000.0000 mg | ORAL_TABLET | Freq: Every day | ORAL | 1 refills | Status: DC

## 2023-01-16 ENCOUNTER — Other Ambulatory Visit: Payer: Self-pay | Admitting: Internal Medicine

## 2023-01-16 DIAGNOSIS — E119 Type 2 diabetes mellitus without complications: Secondary | ICD-10-CM

## 2023-02-03 ENCOUNTER — Other Ambulatory Visit: Payer: No Typology Code available for payment source | Admitting: Pharmacist

## 2023-02-03 DIAGNOSIS — E118 Type 2 diabetes mellitus with unspecified complications: Secondary | ICD-10-CM

## 2023-02-03 DIAGNOSIS — E119 Type 2 diabetes mellitus without complications: Secondary | ICD-10-CM

## 2023-02-03 MED ORDER — METFORMIN HCL ER 500 MG PO TB24: 1000.0000 mg | ORAL_TABLET | Freq: Two times a day (BID) | ORAL | 1 refills | Status: DC

## 2023-02-03 NOTE — Progress Notes (Signed)
02/03/2023 Name: Michael Ashley MRN: [id number] DOB: 06/14/1950  Chief Complaint  Patient presents with   Diabetes   Medication Management   Michael Ashley is a 72 y.o. year old male who presented for a telephone visit.   They were referred to the pharmacist by their PCP for assistance in managing diabetes.    Subjective:  Care Team: Primary Care Provider: Etta Grandchild, MD   Medication Access/Adherence  Current Pharmacy:  [id number] - [address] ([place]), [place] - [address] [place] ([place]) [place] [address] Phone: [phone] Fax: [phone]  -Patient reports affordability concerns with their medications: No  -Patient reports access/transportation concerns to their pharmacy: No  -Patient reports adherence concerns with their medications:  No    Diabetes: Current medications: Farxiga 10mg  daily, metformin xr 500 mg 2 tablets twice daily, Toujeo Max 30 units daily -Current glucose readings: FBG ranges 120-130, has been down to 82 once but none lower than that. Pt notes he noticed an improvement with metformin increase. -He is prescribed Libre 3 CGM and has this at home but is not using.  Continues to check FBG daily with glucometer/strips/lancets -Pt notes he has not made any lifestyle changes since last visit -Patient denies hypoglycemic s/sx including dizziness, shakiness, sweating.  -Patient denies hyperglycemic symptoms including polyuria, polydipsia, polyphagia, nocturia, neuropathy, blurred vision. -States [name] is covered on insurance and affordable to him  Objective: Lab Results  Component Value Date   HGBA1C 9.5 (H) 12/10/2022   Lab Results  Component Value Date   CREATININE 1.08 12/10/2022   BUN 16 12/10/2022   NA 136 12/10/2022   K 4.0 12/10/2022   CL 99 12/10/2022   CO2 31 12/10/2022   Medications Reviewed Today     Reviewed by Bonita Quin, RPH (Pharmacist) on 02/03/23 at 0926  Med List Status: <None>    Medication Order Taking? Sig Documenting Provider Last Dose Status Informant  aspirin EC 81 MG tablet [id number] Yes Take 1 tablet (81 mg total) by mouth daily. Swallow whole. Maisie Fus, MD Taking Active   atorvastatin (LIPITOR) 20 MG tablet [id number] Yes Take 1 tablet (20 mg total) by mouth daily. Etta Grandchild, MD Taking Active   Continuous Blood Gluc Receiver (FREESTYLE LIBRE 3 READER) DEVI [id number] No 1 Act by Does not apply route daily.  Patient not taking: Reported on 02/03/2023   Etta Grandchild, MD Not Taking Active   Continuous Blood Gluc Sensor (FREESTYLE LIBRE 3 SENSOR) [place] [id number] No 1 Act by Does not apply route daily. Place 1 sensor on the skin every 14 days. Use to check glucose continuously  Patient not taking: Reported on 02/03/2023   Etta Grandchild, MD Not Taking Active   FARXIGA 10 MG TABS tablet [id number] Yes Take 1 tablet by mouth once daily Etta Grandchild, MD Taking Active   metFORMIN (GLUCOPHAGE-XR) 500 MG 24 hr tablet [id number] Yes Take 2 tablets (1,000 mg total) by mouth daily with breakfast.  Patient taking differently: Take 1,000 mg by mouth 2 (two) times daily with a meal.   Etta Grandchild, MD Taking Active   NOVOFINE PEN NEEDLE 32G X 6 MM MISC [id number] Yes USE 1  PEN NEEDLE ONCE DAILY Etta Grandchild, MD Taking Active   TOUJEO MAX SOLOSTAR 300 UNIT/ML Solostar Pen [id number] Yes INJECT 30 UNITS SUBCUTANEOUSLY ONCE DAILY Etta Grandchild, MD Taking Active            Assessment/Plan:  Diabetes: - Currently uncontrolled, A1c goal <7.0% - Reviewed goal A1c, goal fasting, and goal 2 hour post prandial glucose - Reviewed dietary modifications including elimination drinks with added sugar, decreasing carbohydrate and sugar intake, and increasing lean proteins - Reviewed lifestyle modifications including: increasing activity level - Last metformin Rx sent was for previous dose, patient will run out early. Will send updated Rx for metformin XR 500 mg  twice daily - He prefers to check finger stick BG. Recommended he check BG in the evenings at least 3x per week to make sure BG are not elevated post prandial.  Follow Up Plan: 11/8 A1c, 11/11 telephone f/u 9 AM *Informed pt to call front desk to schedule recommended 4 month f/u with PCP  Arbutus Leas, PharmD, BCPS [facility] [phone]

## 2023-02-06 ENCOUNTER — Other Ambulatory Visit: Payer: Self-pay | Admitting: Internal Medicine

## 2023-02-06 DIAGNOSIS — E118 Type 2 diabetes mellitus with unspecified complications: Secondary | ICD-10-CM

## 2023-02-06 DIAGNOSIS — N1831 Chronic kidney disease, stage 3a: Secondary | ICD-10-CM

## 2023-03-12 ENCOUNTER — Other Ambulatory Visit (INDEPENDENT_AMBULATORY_CARE_PROVIDER_SITE_OTHER): Payer: No Typology Code available for payment source

## 2023-03-12 DIAGNOSIS — E118 Type 2 diabetes mellitus with unspecified complications: Secondary | ICD-10-CM | POA: Diagnosis not present

## 2023-03-12 LAB — HEMOGLOBIN A1C: Hgb A1c MFr Bld: 7.5 % — ABNORMAL HIGH (ref 4.6–6.5)

## 2023-03-15 ENCOUNTER — Other Ambulatory Visit: Payer: No Typology Code available for payment source | Admitting: Pharmacist

## 2023-03-15 DIAGNOSIS — E118 Type 2 diabetes mellitus with unspecified complications: Secondary | ICD-10-CM

## 2023-03-15 NOTE — Progress Notes (Signed)
03/15/2023 Name: Michael Ashley MRN: [id number] DOB: 23-May-1950  Chief Complaint  Patient presents with   Diabetes   Medication Management   Michael Ashley is a 72 y.o. year old male who presented for a telephone visit.   They were referred to the pharmacist by their PCP for assistance in managing diabetes.   Subjective:  Care Team: Primary Care Provider: Etta Grandchild, MD   Medication Access/Adherence  Current Pharmacy:  [id number] - [address] ([address]), [place] - [address] [place] ([place]) [place] [address] Phone: [phone] Fax: [phone]  -Patient reports affordability concerns with their medications: No  -Patient reports access/transportation concerns to their pharmacy: No  -Patient reports adherence concerns with their medications:  No    Diabetes: Current medications: Farxiga 10mg  daily, metformin xr 500 mg 2 tablets twice daily, Toujeo Max 30 units daily -Current glucose readings: BG 120-125, fasting and sometimes in the evening -He is prescribed Libre 3 CGM and has this at home but is not using.  Continues to check FBG daily with glucometer/strips/lancets -Pt notes he has not made any lifestyle changes since last visit -Patient denies hypoglycemic s/sx including dizziness, shakiness, sweating.  -Patient denies hyperglycemic symptoms including polyuria, polydipsia, polyphagia, nocturia, neuropathy, blurred vision. -States Michael Ashley is covered on insurance and affordable to him  Objective: Lab Results  Component Value Date   HGBA1C 7.5 (H) 03/12/2023   Lab Results  Component Value Date   CREATININE 1.08 12/10/2022   BUN 16 12/10/2022   NA 136 12/10/2022   K 4.0 12/10/2022   CL 99 12/10/2022   CO2 31 12/10/2022   Medications Reviewed Today     Reviewed by Bonita Quin, RPH (Pharmacist) on 03/15/23 at 0915  Med List Status: <None>   Medication Order Taking? Sig Documenting Provider Last Dose Status Informant   aspirin EC 81 MG tablet [id number]  Take 1 tablet (81 mg total) by mouth daily. Swallow whole. Maisie Fus, MD  Active   atorvastatin (LIPITOR) 20 MG tablet [id number]  Take 1 tablet (20 mg total) by mouth daily. Etta Grandchild, MD  Active   Continuous Blood Gluc Receiver (FREESTYLE LIBRE 3 READER) DEVI [id number]  1 Act by Does not apply route daily.  Patient not taking: Reported on 02/03/2023   Etta Grandchild, MD  Active   Continuous Blood Gluc Sensor (FREESTYLE LIBRE 3 SENSOR) [place] [id number]  1 Act by Does not apply route daily. Place 1 sensor on the skin every 14 days. Use to check glucose continuously  Patient not taking: Reported on 02/03/2023   Etta Grandchild, MD  Active   FARXIGA 10 MG TABS tablet [id number] Yes Take 1 tablet by mouth once daily Etta Grandchild, MD Taking Active   metFORMIN (GLUCOPHAGE-XR) 500 MG 24 hr tablet [id number] Yes Take 2 tablets (1,000 mg total) by mouth 2 (two) times daily with a meal. Etta Grandchild, MD Taking Active   NOVOFINE PEN NEEDLE 32G X 6 MM MISC [id number]  USE 1  PEN NEEDLE ONCE DAILY Etta Grandchild, MD  Active   TOUJEO MAX SOLOSTAR 300 UNIT/ML Solostar Pen [id number] Yes INJECT 30 UNITS SUBCUTANEOUSLY ONCE DAILY Etta Grandchild, MD Taking Active            Assessment/Plan:   Diabetes: - Currently uncontrolled, A1c goal <7.5%, A1c much improved, right above goal. Home readings are well controlled. - Reviewed goal A1c, goal fasting, and goal 2 hour post prandial  glucose - Reviewed dietary modifications including elimination drinks with added sugar, decreasing carbohydrate and sugar intake, and increasing lean proteins - Reviewed lifestyle modifications including: increasing activity level - reviewed s/sx hypoglycemia to monitor for and how to treat - He prefers to check finger stick BG. Recommended he check BG in the evenings at least 3x per week to make sure BG are not elevated post prandial.  Follow Up Plan: 2/24 3 month f/u *Informed pt  to call front desk to schedule recommended 6 month f/u with PCP  Arbutus Leas, PharmD, BCPS [facility] [phone]

## 2023-03-15 NOTE — Patient Instructions (Signed)
It was a pleasure speaking with you today!  Continue your current regimen. Give me a call if you begin experiencing low blood sugars. Otherwise, make sure to schedule an appointment with Dr. Yetta Barre in February. I will give you a call at the end of February as a check in.  Feel free to call with any questions or concerns!  Arbutus Leas, PharmD, BCPS [facility] Clinical Pharmacist [facility] [phone]

## 2023-03-17 ENCOUNTER — Ambulatory Visit: Payer: No Typology Code available for payment source | Attending: Internal Medicine | Admitting: Internal Medicine

## 2023-03-17 ENCOUNTER — Encounter: Payer: Self-pay | Admitting: Internal Medicine

## 2023-03-17 VITALS — BP 130/64 | HR 78 | Ht 69.5 in | Wt 201.2 lb

## 2023-03-17 DIAGNOSIS — R931 Abnormal findings on diagnostic imaging of heart and coronary circulation: Secondary | ICD-10-CM | POA: Diagnosis not present

## 2023-03-17 NOTE — Patient Instructions (Signed)
Medication Instructions:  NO CHANGES   *If you need a refill on your cardiac medications before your next appointment, please call your pharmacy*   Lab Work:  NONE   If you have labs (blood work) drawn today and your tests are completely normal, you will receive your results only by: MyChart Message (if you have MyChart) OR A paper copy in the mail If you have any lab test that is abnormal or we need to change your treatment, we will call you to review the results.   Testing/Procedures: NONE    Follow-Up: At [facility], you and your health needs are our priority.  As part of our continuing mission to provide you with exceptional heart care, we have created designated Provider Care Teams.  These Care Teams include your primary Cardiologist (physician) and Advanced Practice Providers (APPs -  Physician Assistants and Nurse Practitioners) who all work together to provide you with the care you need, when you need it.  We recommend signing up for the patient portal called "MyChart".  Sign up information is provided on this After Visit Summary.  MyChart is used to connect with patients for Virtual Visits (Telemedicine).  Patients are able to view lab/test results, encounter notes, upcoming appointments, etc.  Non-urgent messages can be sent to your provider as well.   To learn more about what you can do with MyChart, go to [email or web address].    Your next appointment:   1 year(s)  The format for your next appointment:   In Person  Provider:   Dr. Carolan Clines, MD   Other Instructions

## 2023-03-17 NOTE — Progress Notes (Signed)
Cardiology Office Note:    Date:  03/17/2023   ID:  Michael Ashley, DOB March 09, 1951, MRN [id number]  PCP:  Michael Grandchild, MD   [facility] Providers Cardiologist:  None     Referring MD: Michael Grandchild, MD   No chief complaint on file. CAC  History of Present Illness:    Michael Ashley is a 72 y.o. male with a hx of DM2,  findings suggestive of interstitial lung dx, CAC of 3977 which is 97th percentile. No prior recent cardiac stress test or cardiologist. He states he takes asa 81 mg daily.  He does not exercise. He does not augment his HR. No CP or SOB. No syncope. No orthopnea/PND. No LE edema. Blood pressure is well controlled. [place] is [place]  He smokes cigarettes sometimes. He likes to eat a lot of pasta. He is working on diet and BGs are ok. Michael Ashley passed of MI. Maternal uncles had MI   Interim hx 03/17/2023 Michael Ashley is doing very well today.  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  He really enjoys it, he often will drive to [place] and spent time there.  He has done well with his A1c and dropped it to 7.5%!  Current Medications: Current Outpatient Medications on File Prior to Visit  Medication Sig Dispense Refill   aspirin EC 81 MG tablet Take 1 tablet (81 mg total) by mouth daily. Swallow whole. 30 tablet 12   atorvastatin (LIPITOR) 20 MG tablet Take 1 tablet (20 mg total) by mouth daily. 90 tablet 1   Continuous Blood Gluc Receiver (FREESTYLE LIBRE 3 READER) DEVI 1 Act by Does not apply route daily. (Patient not taking: Reported on 02/03/2023) 1 each 3   Continuous Blood Gluc Sensor (FREESTYLE LIBRE 3 SENSOR) MISC 1 Act by Does not apply route daily. Place 1 sensor on the skin every 14 days. Use to check glucose continuously (Patient not taking: Reported on 02/03/2023) 2 each 5   FARXIGA 10 MG TABS tablet Take 1 tablet by mouth once daily 90 tablet 0   metFORMIN (GLUCOPHAGE-XR) 500 MG 24 hr tablet Take 2 tablets (1,000 mg total) by mouth 2 (two)  times daily with a meal. 90 tablet 1   NOVOFINE PEN NEEDLE 32G X 6 MM MISC USE 1  PEN NEEDLE ONCE DAILY 100 each 5   TOUJEO MAX SOLOSTAR 300 UNIT/ML Solostar Pen INJECT 30 UNITS SUBCUTANEOUSLY ONCE DAILY 9 mL 0   No current facility-administered medications on file prior to visit.  ROS:   Please see the history of present illness.     All other systems reviewed and are negative.  EKGs/Labs/Other Studies Reviewed:    The following studies were reviewed today:   EKG:  EKG is  ordered today.  The ekg ordered today demonstrates   09/11/2022- NSR  Recent Labs: 07/15/2022: ALT 19; TSH 3.43 09/30/2022: Hemoglobin 13.8; Platelets 218.0 12/10/2022: BUN 16; Creatinine, Ser 1.08; Potassium 4.0; Sodium 136    Recent Lipid Panel    Component Value Date/Time   CHOL 114 08/12/2022 1334   TRIG 186.0 (H) 08/12/2022 1334   HDL 41.10 08/12/2022 1334   CHOLHDL 3 08/12/2022 1334   VLDL 37.2 08/12/2022 1334   LDLCALC 35 08/12/2022 1334   LDLDIRECT 84.0 10/10/2015 1004     Risk Assessment/Calculations:     Physical Exam:    VS:   Vitals:   03/17/23 1119  BP: 130/64  Pulse: 78  SpO2: 95%  Wt Readings from Last 3 Encounters:  01/07/23 203 lb (92.1 kg)  12/10/22 204 lb (92.5 kg)  10/20/22 205 lb (93 kg)     GEN:  Well nourished, well developed in no acute distress HEENT: Normal NECK: No JVD; No carotid bruits CARDIAC: RRR, no murmurs, rubs, gallops RESPIRATORY:  Clear to auscultation without rales, wheezing or rhonchi  ABDOMEN: Soft, non-tender, non-distended MUSCULOSKELETAL:  No edema; No deformity  SKIN: Warm and dry NEUROLOGIC:  Alert and oriented x 3 PSYCHIATRIC:  Normal affect   ASSESSMENT:   Elevated CAC: score is very high. He denies symptoms, but not active.  His echocardiogram in June was unremarkable.  Had an exercise SPECT which did not show inducible ischemia or scar. -LDL 35, continue lipitor 20 mg daily - continue asa 81 mg daily  Thoracic aneurysm: 4.4  cm, can repeat CT in 2025.  He is pending a CT next July, can assess his aneurysm then  DM2: poorly controlled A1c 10.4 07/15/2022; working to improve glycemic control. Was in good control before.  on farxiga 10 mg daily and insulin. A1c goal. <7; most recent was 7.5% which is much improved  Stage 3a CKD: continue SGLT2  [name] is overall doing very well.  He is lowering his risk of MI and stroke by lowering his A1c.  He remains asymptomatic.  We discussed that if he were to develop any chest pain or shortness of breath to let us know.  Otherwise no changes for today PLAN:    In order of problems listed above:    Follow up in 1 year      Medication Adjustments/Labs and Tests Ordered: Current medicines are reviewed at length with the patient today.  Concerns regarding medicines are outlined above.  No orders of the defined types were placed in this encounter.  No orders of the defined types were placed in this encounter.   There are no Patient Instructions on file for this visit.   Signed, Maisie Fus, MD  03/17/2023 11:02 AM    [facility]

## 2023-03-18 ENCOUNTER — Other Ambulatory Visit: Payer: Self-pay | Admitting: Internal Medicine

## 2023-03-18 DIAGNOSIS — E119 Type 2 diabetes mellitus without complications: Secondary | ICD-10-CM

## 2023-04-04 ENCOUNTER — Other Ambulatory Visit: Payer: Self-pay | Admitting: Internal Medicine

## 2023-04-04 DIAGNOSIS — E119 Type 2 diabetes mellitus without complications: Secondary | ICD-10-CM

## 2023-04-28 ENCOUNTER — Emergency Department (HOSPITAL_COMMUNITY)
Admission: EM | Admit: 2023-04-28 | Discharge: 2023-04-28 | Disposition: A | Discharge status: Home / Self Care | Payer: No Typology Code available for payment source | Attending: Emergency Medicine | Admitting: Emergency Medicine

## 2023-04-28 ENCOUNTER — Other Ambulatory Visit: Payer: Self-pay

## 2023-04-28 DIAGNOSIS — Z7982 Long term (current) use of aspirin: Secondary | ICD-10-CM | POA: Diagnosis not present

## 2023-04-28 DIAGNOSIS — R112 Nausea with vomiting, unspecified: Secondary | ICD-10-CM

## 2023-04-28 DIAGNOSIS — E860 Dehydration: Secondary | ICD-10-CM | POA: Diagnosis not present

## 2023-04-28 DIAGNOSIS — N179 Acute kidney failure, unspecified: Secondary | ICD-10-CM | POA: Diagnosis not present

## 2023-04-28 DIAGNOSIS — Z794 Long term (current) use of insulin: Secondary | ICD-10-CM | POA: Insufficient documentation

## 2023-04-28 DIAGNOSIS — E119 Type 2 diabetes mellitus without complications: Secondary | ICD-10-CM | POA: Diagnosis not present

## 2023-04-28 DIAGNOSIS — Z7984 Long term (current) use of oral hypoglycemic drugs: Secondary | ICD-10-CM | POA: Insufficient documentation

## 2023-04-28 LAB — BASIC METABOLIC PANEL
Anion gap: 16 — ABNORMAL HIGH (ref 5–15)
BUN: 46 mg/dL — ABNORMAL HIGH (ref 8–23)
CO2: 18 mmol/L — ABNORMAL LOW (ref 22–32)
Calcium: 9.5 mg/dL (ref 8.9–10.3)
Chloride: 99 mmol/L (ref 98–111)
Creatinine, Ser: 1.79 mg/dL — ABNORMAL HIGH (ref 0.61–1.24)
GFR, Estimated: 39 mL/min — ABNORMAL LOW (ref >60)
Glucose, Bld: 204 mg/dL — ABNORMAL HIGH (ref 70–99)
Potassium: 3.7 mmol/L (ref 3.5–5.1)
Sodium: 133 mmol/L — ABNORMAL LOW (ref 135–145)

## 2023-04-28 LAB — CBC
HCT: 48.2 % (ref 39.0–52.0)
Hemoglobin: 16.2 g/dL (ref 13.0–17.0)
MCH: 31 pg (ref 26.0–34.0)
MCHC: 33.6 g/dL (ref 30.0–36.0)
MCV: 92.3 fL (ref 80.0–100.0)
Platelets: 231 10*3/uL (ref 150–400)
RBC: 5.22 MIL/uL (ref 4.22–5.81)
RDW: 13.6 % (ref 11.5–15.5)
WBC: 8.2 10*3/uL (ref 4.0–10.5)
nRBC: 0 % (ref 0.0–0.2)

## 2023-04-28 MED ORDER — ONDANSETRON HCL 4 MG/2ML IJ SOLN
4.0000 mg | Freq: Once | INTRAMUSCULAR | Status: AC
  Administered 2023-04-28: 4 mg via INTRAVENOUS
  Filled 2023-04-28: qty 2

## 2023-04-28 MED ORDER — SODIUM CHLORIDE 0.9 % IV BOLUS
1000.0000 mL | Freq: Once | INTRAVENOUS | Status: AC
  Administered 2023-04-28: 1000 mL via INTRAVENOUS

## 2023-04-28 MED ORDER — ONDANSETRON HCL 4 MG PO TABS: 4.0000 mg | ORAL_TABLET | Freq: Four times a day (QID) | ORAL | 0 refills | Status: DC

## 2023-04-28 NOTE — ED Notes (Signed)
Patient discharged by this RN. Patient verbalizes understanding of instructions with no additional questions. Ambulatory at time of discharge.

## 2023-04-28 NOTE — ED Triage Notes (Signed)
Patient arrives POV for nausea, vomiting, and diarrhea 10x since yesterday morning. Endorses being in contact with wife who is currently sick. Patient endorses feeling better today. No tenderness on palpation.

## 2023-04-28 NOTE — Discharge Instructions (Signed)
Your testing today has shown that you were dehydrated and required IV fluids.  The kidney test was abnormal and this does need to be rechecked at your doctor's office within 1 week.  Please call your office first thing tomorrow morning to make this appointment.  Your creatinine today was about 1.8, this is the test that needs to be rechecked.  Zofran is a medication which can help with nausea.  You may take 4 mg by mouth every 6 hours as needed if you are an adult, if your child under the age of 6 take half of a tablet or 2 mg every 6 hours as needed.  This should dissolve on your tongue within a short timeframe.  Wait about 30 minutes after taking it to help with drinking clear liquids.  Thank you for allowing [place] to treat you in the emergency department today.  After reviewing your examination and potential testing that was done it appears that you are safe to go home.  I would like for you to follow-up with your doctor within the next several days, have them obtain your records and follow-up with them to review all potential tests and results from your visit.  If you should develop severe or worsening symptoms return to the emergency department immediately

## 2023-04-28 NOTE — ED Provider Notes (Signed)
[place] EMERGENCY DEPARTMENT AT [facility] Provider Note   CSN: [id number] Arrival date & time: 04/28/23  4098     History  Chief Complaint  Patient presents with   Abdominal Pain    Michael Ashley is a 72 y.o. male.   Abdominal Pain  This patient is a 72 year old male, he has a history of diabetes, he has a known history of neuropathy, he is on insulin and metformin, has a history of Lipitor use for cholesterol, he presents with a complaint of vomiting and diarrhea going on for the last 24 hours, his grandkids have had the same symptoms starting 48 hours ago, the patient denies having any abdominal pain or fevers, he states the diarrhea is watery and nonbloody.  He has not had anything to eat or drink in over 12 hours and is feeling weak and dehydrated.  No recent travel.  No medications prior to arrival, he did not take his morning diabetic medications    Home Medications Prior to Admission medications   Medication Sig Start Date End Date Taking? Authorizing Provider  ondansetron (ZOFRAN) 4 MG tablet Take 1 tablet (4 mg total) by mouth every 6 (six) hours. 04/28/23  Yes Eber Hong, MD  aspirin EC 81 MG tablet Take 1 tablet (81 mg total) by mouth daily. Swallow whole. 09/11/22   Maisie Fus, MD  atorvastatin (LIPITOR) 20 MG tablet Take 1 tablet (20 mg total) by mouth daily. 12/14/22   Etta Grandchild, MD  Continuous Blood Gluc Receiver (FREESTYLE LIBRE 3 READER) DEVI 1 Act by Does not apply route daily. 07/15/22   Etta Grandchild, MD  Continuous Blood Gluc Sensor (FREESTYLE LIBRE 3 SENSOR) MISC 1 Act by Does not apply route daily. Place 1 sensor on the skin every 14 days. Use to check glucose continuously 07/15/22   Etta Grandchild, MD  FARXIGA 10 MG TABS tablet Take 1 tablet by mouth once daily 02/06/23   Etta Grandchild, MD  metFORMIN (GLUCOPHAGE-XR) 500 MG 24 hr tablet TAKE 2 TABLETS BY MOUTH TWICE DAILY WITH A MEAL 04/04/23   Etta Grandchild, MD  NOVOFINE PEN NEEDLE  32G X 6 MM MISC USE 1  PEN NEEDLE ONCE DAILY 01/16/23   Etta Grandchild, MD  TOUJEO MAX SOLOSTAR 300 UNIT/ML Solostar Pen INJECT 30 UNITS SUBCUTANEOUSLY ONCE DAILY 12/25/22   Etta Grandchild, MD      Allergies    Lisinopril    Review of Systems   Review of Systems  Gastrointestinal:  Positive for abdominal pain.  All other systems reviewed and are negative.   Physical Exam Updated Vital Signs BP 112/86 (BP Location: Right Arm)   Pulse 96   Temp 98.8 F (37.1 C) (Oral)   Resp 17   Ht 1.765 m (5' 9.5")   Wt 90.7 kg   SpO2 97%   BMI 29.11 kg/m  Physical Exam Vitals and nursing note reviewed.  Constitutional:      General: He is not in acute distress.    Appearance: He is well-developed.  HENT:     Head: Normocephalic and atraumatic.     Mouth/Throat:     Mouth: Mucous membranes are dry.     Pharynx: No oropharyngeal exudate.  Eyes:     General: No scleral icterus.       Right eye: No discharge.        Left eye: No discharge.     Conjunctiva/sclera: Conjunctivae normal.  Pupils: Pupils are equal, round, and reactive to light.  Neck:     Thyroid: No thyromegaly.     Vascular: No JVD.  Cardiovascular:     Rate and Rhythm: Normal rate and regular rhythm.     Heart sounds: Normal heart sounds. No murmur heard.    No friction rub. No gallop.  Pulmonary:     Effort: Pulmonary effort is normal. No respiratory distress.     Breath sounds: Normal breath sounds. No wheezing or rales.  Abdominal:     General: There is no distension.     Palpations: Abdomen is soft. There is no mass.     Tenderness: There is no abdominal tenderness.     Comments: Increased bowel sounds  Musculoskeletal:        General: No tenderness. Normal range of motion.     Cervical back: Normal range of motion and neck supple.  Lymphadenopathy:     Cervical: No cervical adenopathy.  Skin:    General: Skin is warm and dry.     Findings: No erythema or rash.  Neurological:     Mental Status: He is  alert.     Coordination: Coordination normal.  Psychiatric:        Behavior: Behavior normal.     ED Results / Procedures / Treatments   Labs (all labs ordered are listed, but only abnormal results are displayed) Labs Reviewed  BASIC METABOLIC PANEL - Abnormal; Notable for the following components:      Result Value   Sodium 133 (*)    CO2 18 (*)    Glucose, Bld 204 (*)    BUN 46 (*)    Creatinine, Ser 1.79 (*)    GFR, Estimated 39 (*)    Anion gap 16 (*)    All other components within normal limits  CBC    EKG EKG Interpretation Date/Time:  Wednesday April 28 2023 09:30:49 EST Ventricular Rate:  97 PR Interval:  184 QRS Duration:  100 QT Interval:  364 QTC Calculation: 463 R Axis:   82  Text Interpretation: Sinus rhythm Borderline right axis deviation Confirmed by Eber Hong ([id number]) on 04/28/2023 9:37:30 AM  Radiology No results found.  Procedures Procedures    Medications Ordered in ED Medications  sodium chloride 0.9 % bolus 1,000 mL (0 mLs Intravenous Stopped 04/28/23 1105)  ondansetron (ZOFRAN) injection 4 mg (4 mg Intravenous Given 04/28/23 1324)    ED Course/ Medical Decision Making/ A&P                                 Medical Decision Making Amount and/or Complexity of Data Reviewed Labs: ordered.  Risk Prescription drug management.    This patient presents to the ED for concern of vomiting and diarrhea differential diagnosis includes viral gastroenteritis, multiple family members with the same, would also consider food poisoning, seems less likely to be appendicitis or gallbladder disease given the lack of abdominal tenderness    Additional history obtained:Prior records reviewed, history of interstitial lung disease followed by pulmonology, no recent admissions to the hospital going back a couple of years.   Lab Tests:  I Ordered, and personally interpreted labs.  The pertinent results include: Creatinine slightly elevated around  1.8, no leukocytosis   Medicines ordered and prescription drug management:  I ordered medication including ondansetron for nausea and vomiting, also given IV fluids Reevaluation of the patient after these medicines showed  that the patient significant improvement and now tolerating oral food and fluids I have reviewed the patients home medicines and have made adjustments as needed   Problem List / ED Course:  Improved with medications, he was informed of his renal dysfunction and is agreeable to follow-up to have these test reviewed and rechecked   Social Determinants of Health:  None          Final Clinical Impression(s) / ED Diagnoses Final diagnoses:  Acute kidney injury (HCC)  Dehydration  Nausea vomiting and diarrhea    Rx / DC Orders ED Discharge Orders          Ordered    ondansetron (ZOFRAN) 4 MG tablet  Every 6 hours        04/28/23 1125              Eber Hong, MD 04/28/23 1128

## 2023-04-28 NOTE — ED Notes (Signed)
Patient given water and crackers to PO trial per MD Hyacinth Meeker

## 2023-05-01 ENCOUNTER — Other Ambulatory Visit: Payer: Self-pay | Admitting: Internal Medicine

## 2023-05-01 DIAGNOSIS — E119 Type 2 diabetes mellitus without complications: Secondary | ICD-10-CM

## 2023-05-04 ENCOUNTER — Other Ambulatory Visit: Payer: Self-pay | Admitting: Internal Medicine

## 2023-05-04 ENCOUNTER — Ambulatory Visit (INDEPENDENT_AMBULATORY_CARE_PROVIDER_SITE_OTHER): Payer: No Typology Code available for payment source | Admitting: Internal Medicine

## 2023-05-04 ENCOUNTER — Encounter: Payer: Self-pay | Admitting: Internal Medicine

## 2023-05-04 VITALS — BP 134/78 | HR 65 | Temp 98.0°F | Resp 16 | Ht 69.5 in | Wt 196.2 lb

## 2023-05-04 DIAGNOSIS — N1832 Chronic kidney disease, stage 3b: Secondary | ICD-10-CM | POA: Insufficient documentation

## 2023-05-04 DIAGNOSIS — E118 Type 2 diabetes mellitus with unspecified complications: Secondary | ICD-10-CM

## 2023-05-04 DIAGNOSIS — E876 Hypokalemia: Secondary | ICD-10-CM | POA: Diagnosis not present

## 2023-05-04 DIAGNOSIS — E785 Hyperlipidemia, unspecified: Secondary | ICD-10-CM | POA: Diagnosis not present

## 2023-05-04 DIAGNOSIS — N1831 Chronic kidney disease, stage 3a: Secondary | ICD-10-CM

## 2023-05-04 LAB — BASIC METABOLIC PANEL
BUN: 15 mg/dL (ref 6–23)
CO2: 28 meq/L (ref 19–32)
Calcium: 8.9 mg/dL (ref 8.4–10.5)
Chloride: 105 meq/L (ref 96–112)
Creatinine, Ser: 0.97 mg/dL (ref 0.40–1.50)
GFR: 76.94 mL/min (ref >60.00)
Glucose, Bld: 144 mg/dL — ABNORMAL HIGH (ref 70–99)
Potassium: 3.3 meq/L — ABNORMAL LOW (ref 3.5–5.1)
Sodium: 142 meq/L (ref 135–145)

## 2023-05-04 LAB — URINALYSIS, ROUTINE W REFLEX MICROSCOPIC
Bilirubin Urine: NEGATIVE
Hgb urine dipstick: NEGATIVE
Ketones, ur: NEGATIVE
Leukocytes,Ua: NEGATIVE
Nitrite: NEGATIVE
RBC / HPF: NONE SEEN (ref 0–?)
Specific Gravity, Urine: 1.01 (ref 1.000–1.030)
Total Protein, Urine: NEGATIVE
Urine Glucose: >=1000 — AB
Urobilinogen, UA: 0.2 (ref 0.0–1.0)
pH: 6 (ref 5.0–8.0)

## 2023-05-04 LAB — HEPATIC FUNCTION PANEL
ALT: 27 U/L (ref 0–53)
AST: 32 U/L (ref 0–37)
Albumin: 3.6 g/dL (ref 3.5–5.2)
Alkaline Phosphatase: 71 U/L (ref 39–117)
Bilirubin, Direct: 0.3 mg/dL (ref 0.0–0.3)
Total Bilirubin: 1 mg/dL (ref 0.2–1.2)
Total Protein: 7 g/dL (ref 6.0–8.3)

## 2023-05-04 LAB — MICROALBUMIN / CREATININE URINE RATIO
Creatinine,U: 51.2 mg/dL
Microalb Creat Ratio: 2 mg/g (ref 0.0–30.0)
Microalb, Ur: 1 mg/dL (ref 0.0–1.9)

## 2023-05-04 LAB — TSH: TSH: 1.11 u[IU]/mL (ref 0.35–5.50)

## 2023-05-04 MED ORDER — POTASSIUM CHLORIDE ER 10 MEQ PO TBCR: 10.0000 meq | EXTENDED_RELEASE_TABLET | Freq: Three times a day (TID) | ORAL | 0 refills | Status: DC

## 2023-05-04 NOTE — Progress Notes (Signed)
 Subjective:  Patient ID: Michael  Ashley, male    DOB: May 15, 1950  Age: 72 y.o. MRN: [id number]  CC: Hypertension and Diabetes   HPI Allen  Ligman presents for f/up ----  Discussed the use of AI scribe software for clinical note transcription with the patient, who gave verbal consent to proceed.  History of Present Illness   The patient, with a recent history of diarrhea and vomiting, reports that these symptoms have resolved. He denies any current nausea, vomiting, diarrhea, fever, chills, dizziness, lightheadedness, chest pain, or shortness of breath. However, he mentions a recent elevation in creatinine levels, attributed to dehydration, and has been advised to recheck these levels. He denies any presence of blood in his stool and does not report any feelings of weakness, dizziness, or lightheadedness.  The patient also reports a problem with balance, needing to lean on something when standing, but denies any numbness or tingling. He has received a flu shot in the previous October.       Outpatient Medications Prior to Visit  Medication Sig Dispense Refill   aspirin  EC 81 MG tablet Take 1 tablet (81 mg total) by mouth daily. Swallow whole. 30 tablet 12   atorvastatin  (LIPITOR) 20 MG tablet Take 1 tablet (20 mg total) by mouth daily. 90 tablet 1   Continuous Blood Gluc Receiver (FREESTYLE LIBRE 3 READER) DEVI 1 Act by Does not apply route daily. 1 each 3   Continuous Blood Gluc Sensor (FREESTYLE LIBRE 3 SENSOR) MISC 1 Act by Does not apply route daily. Place 1 sensor on the skin every 14 days. Use to check glucose continuously 2 each 5   FARXIGA  10 MG TABS tablet Take 1 tablet by mouth once daily 90 tablet 0   metFORMIN  (GLUCOPHAGE -XR) 500 MG 24 hr tablet TAKE 2 TABLETS BY MOUTH TWICE DAILY WITH A MEAL 90 tablet 0   NOVOFINE PEN NEEDLE 32G X 6 MM MISC USE 1  PEN NEEDLE ONCE DAILY 100 each 5   ondansetron  (ZOFRAN ) 4 MG tablet Take 1 tablet (4 mg total) by mouth every 6 (six) hours. 12 tablet 0    TOUJEO  MAX SOLOSTAR 300 UNIT/ML Solostar Pen INJECT 30 UNITS SUBCUTANEOUSLY ONCE DAILY 9 mL 0   No facility-administered medications prior to visit.    ROS Review of Systems  Constitutional: Negative.  Negative for appetite change, diaphoresis, fatigue and unexpected weight change.  HENT: Negative.    Eyes: Negative.  Negative for visual disturbance.  Respiratory:  Negative for cough, chest tightness, shortness of breath and wheezing.   Cardiovascular:  Negative for chest pain, palpitations and leg swelling.  Gastrointestinal:  Negative for abdominal pain, constipation, diarrhea, nausea and vomiting.  Genitourinary: Negative.  Negative for difficulty urinating.  Musculoskeletal:  Positive for gait problem.  Skin: Negative.  Negative for color change and rash.  Neurological:  Negative for dizziness, weakness and light-headedness.  Hematological:  Negative for adenopathy. Does not bruise/bleed easily.  Psychiatric/Behavioral: Negative.      Objective:  BP 134/78 (BP Location: Left Arm, Patient Position: Sitting, Cuff Size: Normal)   Pulse 65   Temp 98 F (36.7 C) (Oral)   Resp 16   Ht 5' 9.5 (1.765 m)   Wt 196 lb 3.2 oz (89 kg)   SpO2 92%   BMI 28.56 kg/m   BP Readings from Last 3 Encounters:  05/04/23 134/78  04/28/23 115/86  03/17/23 130/64    Wt Readings from Last 3 Encounters:  05/04/23 196 lb 3.2 oz (89 kg)  04/28/23 200 lb (90.7 kg)  03/17/23 201 lb 3.2 oz (91.3 kg)    Physical Exam Vitals reviewed.  Constitutional:      Appearance: Normal appearance.  HENT:     Mouth/Throat:     Mouth: Mucous membranes are moist.  Eyes:     General: No scleral icterus.    Conjunctiva/sclera: Conjunctivae normal.  Cardiovascular:     Rate and Rhythm: Normal rate and regular rhythm.     Heart sounds: No murmur heard.    No friction rub. No gallop.  Pulmonary:     Effort: Pulmonary effort is normal.     Breath sounds: No stridor. No wheezing, rhonchi or rales.   Abdominal:     General: Abdomen is flat.     Palpations: There is no mass.     Tenderness: There is no abdominal tenderness. There is no guarding.     Hernia: No hernia is present.  Musculoskeletal:        General: Normal range of motion.     Cervical back: Neck supple.     Right lower leg: No edema.     Left lower leg: No edema.  Lymphadenopathy:     Cervical: No cervical adenopathy.  Skin:    General: Skin is warm and dry.  Neurological:     General: No focal deficit present.     Mental Status: He is alert. Mental status is at baseline.  Psychiatric:        Mood and Affect: Mood normal.        Behavior: Behavior normal.     Lab Results  Component Value Date   WBC 8.2 04/28/2023   HGB 16.2 04/28/2023   HCT 48.2 04/28/2023   PLT 231 04/28/2023   GLUCOSE 144 (H) 05/04/2023   CHOL 114 08/12/2022   TRIG 186.0 (H) 08/12/2022   HDL 41.10 08/12/2022   LDLDIRECT 84.0 10/10/2015   LDLCALC 35 08/12/2022   ALT 27 05/04/2023   AST 32 05/04/2023   NA 142 05/04/2023   K 3.3 (L) 05/04/2023   CL 105 05/04/2023   CREATININE 0.97 05/04/2023   BUN 15 05/04/2023   CO2 28 05/04/2023   TSH 1.11 05/04/2023   PSA 1.25 08/12/2022   HGBA1C 7.5 (H) 03/12/2023   MICROALBUR 1.0 05/04/2023    No results found.  Assessment & Plan:   Stage 3b chronic kidney disease (HCC)- His renal function has improved. -     Urinalysis, Routine w reflex microscopic; Future -     Basic metabolic panel; Future -     Microalbumin / creatinine urine ratio; Future  Type II diabetes mellitus with manifestations (HCC) - Blood sugar is well controlled. -     Microalbumin / creatinine urine ratio; Future  Hyperlipidemia with target LDL less than 70 -     TSH; Future -     Hepatic function panel; Future  Hypokalemia due to loss of potassium -     Potassium Chloride  ER; Take 1 tablet (10 mEq total) by mouth 3 (three) times daily.  Dispense: 90 tablet; Refill: 0     Follow-up: Return in about 3 months  (around 08/02/2023).  Debby Molt, MD

## 2023-05-04 NOTE — Patient Instructions (Signed)
 Chronic Kidney Disease, Adult Chronic kidney disease (CKD) occurs when the kidneys are slowly and permanently damaged over a long period of time. The kidneys are a pair of organs that do many important jobs in the body, including: Removing waste and extra fluid from the blood to make urine. Making hormones that maintain the amount of fluid in tissues and blood vessels. Maintaining the right amount of fluids and chemicals in the body. A small amount of kidney damage may not cause problems, but a large amount of damage may make it hard or impossible for the kidneys to work right. Steps must be taken to slow kidney damage or to stop it from getting worse. If steps are not taken, the kidneys may stop working permanently (end-stage renal disease, or ESRD). Most of the time, CKD does not go away, but it can often be controlled. People who have CKD are usually able to live full lives. What are the causes? The most common causes of this condition are diabetes and high blood pressure (hypertension). Other causes include: Cardiovascular diseases. These affect the heart and blood vessels. Kidney diseases. These include: Glomerulonephritis, or inflammation of the tiny filters in the kidneys. Interstitial nephritis. This is swelling of the small tubes of the kidneys and of the surrounding structures. Polycystic kidney disease, in which clusters of fluid-filled sacs form within the kidneys. Renal vascular disease. This includes disorders that affect the arteries and veins of the kidneys. Diseases that affect the body's defense system (immune system). A problem with urine flow. This may be caused by: Kidney stones. Cancer. An enlarged prostate, in males. A kidney infection or urinary tract infection (UTI) that keeps coming back. Vasculitis. This is swelling or inflammation of the blood vessels. What increases the risk? Your chances of having kidney disease increase with age. The following factors may make  you more likely to develop this condition: A family history of kidney disease or kidney failure. Kidney failure means the kidneys can no longer work right. Certain genetic diseases. Taking medicines often that are damaging to the kidneys. Being around or being in contact with toxic substances. Obesity. A history of tobacco use. What are the signs or symptoms? Symptoms of this condition include: Feeling very tired (lethargic) and having less energy. Swelling, or edema, of the face, legs, ankles, or feet. Nausea or vomiting, or loss of appetite. Confusion or trouble concentrating. Muscle twitches and cramps, especially in the legs. Dry, itchy skin. A metallic taste in the mouth. Producing less urine, or producing more urine (especially at night). Shortness of breath. Trouble sleeping. CKD may also result in not having enough red blood cells or hemoglobin in the blood (anemia) or having weak bones (bone disease). Symptoms develop slowly and may not be obvious until the kidney damage becomes severe. It is possible to have kidney disease for years without having symptoms. How is this diagnosed? This condition may be diagnosed based on: Blood tests. Urine tests. Imaging tests, such as an ultrasound or a CT scan. A kidney biopsy. This involves removing a sample of kidney tissue to be looked at under a microscope. Results from these tests will help to determine how serious the CKD is. How is this treated? There is no cure for most cases of this condition, but treatment usually relieves symptoms and prevents or slows the worsening of the disease. Treatment may include: Diet changes, which may require you to avoid alcohol and foods that are high in salt, potassium, phosphorous, and protein. Medicines. These may:  Lower blood pressure. Control blood sugar (glucose). Relieve anemia. Relieve swelling. Protect your bones. Improve the balance of salts and minerals in your blood  (electrolytes). Dialysis, which is a type of treatment that removes toxic waste from the body. It may be needed if you have kidney failure. Managing any other conditions that are causing your CKD or making it worse. Follow these instructions at home: Medicines Take over-the-counter and prescription medicines only as told by your health care provider. The amount of some medicines that you take may need to be changed. Do not take any new medicines unless approved by your health care provider. Many medicines can make kidney damage worse. Do not take any vitamin and mineral supplements unless approved by your health care provider. Many nutritional supplements can make kidney damage worse. Lifestyle  Do not use any products that contain nicotine or tobacco, such as cigarettes, e-cigarettes, and chewing tobacco. If you need help quitting, ask your health care provider. If you drink alcohol: Limit how much you use to: 0-1 drink a day for women who are not pregnant. 0-2 drinks a day for men. Know how much alcohol is in your drink. In the [place] one drink equals one 12 oz bottle of beer (355 mL), one 5 oz glass of wine (148 mL), or one 1 oz glass of hard liquor (44 mL). Maintain a healthy weight. If you need help, ask your health care provider. General instructions  Follow instructions from your health care provider about eating or drinking restrictions, including any prescribed diet. Track your blood pressure at home. Report changes in your blood pressure as told. If you are being treated for diabetes, track your blood glucose levels as told. Start or continue an exercise plan. Exercise at least 30 minutes a day, 5 days a week. Keep your immunizations up to date as told. Keep all follow-up visits. This is important. Where to find more information American Association of Kidney Patients: ResidentialShow.is [employer]: www.kidney.org American Kidney Fund: FightingMatch.com.ee Life Options:  www.lifeoptions.org Kidney School: www.kidneyschool.org Contact a health care provider if: Your symptoms get worse. You develop new symptoms. Get help right away if: You develop symptoms of ESRD. These include: Headaches. Numbness in your hands or feet. Easy bruising. Frequent hiccups. Chest pain. Shortness of breath. Lack of menstrual periods, in women. You have a fever. You are producing less urine than usual. You have pain or bleeding when you urinate or when you have a bowel movement. These symptoms may represent a serious problem that is an emergency. Do not wait to see if the symptoms will go away. Get medical help right away. Call your local emergency services (911 in the [place] Do not drive yourself to the hospital. Summary Chronic kidney disease (CKD) occurs when the kidneys become damaged slowly over a long period of time. The most common causes of this condition are diabetes and high blood pressure (hypertension). There is no cure for most cases of CKD, but treatment usually relieves symptoms and prevents or slows the worsening of the disease. Treatment may include a combination of lifestyle changes, medicines, and dialysis. This information is not intended to replace advice given to you by your health care provider. Make sure you discuss any questions you have with your health care provider. Document Revised: 07/21/2019 Document Reviewed: 07/26/2019 Elsevier Patient Education  2024 [employer].

## 2023-05-10 ENCOUNTER — Other Ambulatory Visit: Payer: Self-pay | Admitting: Internal Medicine

## 2023-05-10 DIAGNOSIS — E119 Type 2 diabetes mellitus without complications: Secondary | ICD-10-CM

## 2023-05-10 MED ORDER — METFORMIN HCL ER 500 MG PO TB24: 500.0000 mg | ORAL_TABLET | Freq: Every day | ORAL | 0 refills | Status: DC

## 2023-05-10 NOTE — Telephone Encounter (Signed)
 Copied from CRM [id number]. Topic: Clinical - Medication Refill >> May 10, 2023  8:10 AM Alfonso ORN wrote: Most Recent Primary Care Visit:  Provider: [name]  Department: [facility]  Visit Type: OFFICE VISIT  Date: 05/04/2023  Medication: metFORMIN  (GLUCOPHAGE -XR) 500 MG 24 hr tablet  Has the patient contacted their pharmacy? Yes, call twice pharmacy do not have the refill  (Agent: If no, request that the patient contact the pharmacy for the refill. If patient does not wish to contact the pharmacy document the reason why and proceed with request.) (Agent: If yes, when and what did the pharmacy advise?)  Is this the correct pharmacy for this prescription? Yes  If no, delete pharmacy and type the correct one.  This is the patient's preferred pharmacy:  [id number] - [place] ([place]), [place] - [address] [place] ([place]) [place] [address] Phone: [phone] Fax: [phone]   Has the prescription been filled recently? no  Is the patient out of the medication? Yes , out for 2 days   Has the patient been seen for an appointment in the last year OR does the patient have an upcoming appointment? Yes ,Patient have had recent Doctor appointments , 05/04/23 and one upcoming on 06/15/23  Can we respond through MyChart?   Agent: Please be advised that Rx refills may take up to 3 business days. We ask that you follow-up with your pharmacy.

## 2023-05-10 NOTE — Telephone Encounter (Signed)
  Rx was sent today  Copied from CRM [id number]. Topic: Clinical - Prescription Issue >> May 10, 2023  8:03 AM Michael Ashley ORN wrote: Reason for CRM: patient calling on status of his refill for the  metFORMIN  (GLUCOPHAGE -XR) 500 MG 24 hr tablet , requested refill on 05/03/23 and do not have. Patient reached out to the pharmacy and still do not have the prescription refill, patient is out of his medication for 2 days . Patient have had recent Doctor appointments , 05/04/23 and one upcoming on  06/15/23. Please reach out to patient to let know status of refill .  [id number] - [place] ([place]), [place] - [address], [place] ([place]) [place] [address] Phone: [phone]  Fax: [phone]

## 2023-05-19 ENCOUNTER — Telehealth: Payer: Self-pay | Admitting: Internal Medicine

## 2023-05-19 NOTE — Telephone Encounter (Signed)
 Copied from CRM [id number]. Topic: Clinical - Prescription Issue >> May 19, 2023  9:58 AM Ilene Malling wrote: Reason for CRM: Patient [phone] states [facility] - [place] ([place]), [place] -[address] [place] ([place]) [place] [address] [phone]:[phone] sent a priot auth for NOVOFINE PEN NEEDLE 32G X 6 MM MISC. Patient states the pharmacy has not heard back from the office, and will run out of medication in 5 days.

## 2023-05-21 ENCOUNTER — Telehealth: Payer: Self-pay

## 2023-05-21 ENCOUNTER — Other Ambulatory Visit (HOSPITAL_COMMUNITY): Payer: Self-pay

## 2023-05-21 NOTE — Telephone Encounter (Signed)
Pharmacy Patient Advocate Encounter   Received notification from Pt Calls Messages that prior authorization for Novofine pen needles is required/requested.   Insurance verification completed.   The patient is insured through [facility] .   Per test claim: BD pen needles are covered at $0 copay. Placed a call to [facility] pharmacy to see if they would change it. Spoke with pharmacist Judie Grieve and he was speaking with Mr. Lello to let him know the BD pen needles were covered under his insurance. PA is not required.

## 2023-05-30 ENCOUNTER — Other Ambulatory Visit: Payer: Self-pay | Admitting: Internal Medicine

## 2023-05-30 DIAGNOSIS — E876 Hypokalemia: Secondary | ICD-10-CM

## 2023-06-11 ENCOUNTER — Other Ambulatory Visit: Payer: Self-pay | Admitting: Internal Medicine

## 2023-06-11 DIAGNOSIS — E785 Hyperlipidemia, unspecified: Secondary | ICD-10-CM

## 2023-06-15 ENCOUNTER — Encounter: Payer: Self-pay | Admitting: Internal Medicine

## 2023-06-15 ENCOUNTER — Ambulatory Visit: Payer: No Typology Code available for payment source | Admitting: Internal Medicine

## 2023-06-15 VITALS — BP 146/86 | HR 76 | Temp 98.0°F | Resp 16 | Ht 69.5 in | Wt 197.0 lb

## 2023-06-15 DIAGNOSIS — E118 Type 2 diabetes mellitus with unspecified complications: Secondary | ICD-10-CM

## 2023-06-15 DIAGNOSIS — K148 Other diseases of tongue: Secondary | ICD-10-CM | POA: Insufficient documentation

## 2023-06-15 DIAGNOSIS — Z794 Long term (current) use of insulin: Secondary | ICD-10-CM

## 2023-06-15 DIAGNOSIS — N1832 Chronic kidney disease, stage 3b: Secondary | ICD-10-CM

## 2023-06-15 DIAGNOSIS — E785 Hyperlipidemia, unspecified: Secondary | ICD-10-CM | POA: Diagnosis not present

## 2023-06-15 DIAGNOSIS — E876 Hypokalemia: Secondary | ICD-10-CM | POA: Diagnosis not present

## 2023-06-15 DIAGNOSIS — E119 Type 2 diabetes mellitus without complications: Secondary | ICD-10-CM

## 2023-06-15 LAB — BASIC METABOLIC PANEL
BUN: 21 mg/dL (ref 6–23)
CO2: 28 meq/L (ref 19–32)
Calcium: 9.8 mg/dL (ref 8.4–10.5)
Chloride: 101 meq/L (ref 96–112)
Creatinine, Ser: 1.07 mg/dL (ref 0.40–1.50)
GFR: 68.34 mL/min (ref >60.00)
Glucose, Bld: 125 mg/dL — ABNORMAL HIGH (ref 70–99)
Potassium: 4.1 meq/L (ref 3.5–5.1)
Sodium: 137 meq/L (ref 135–145)

## 2023-06-15 LAB — LIPID PANEL
Cholesterol: 103 mg/dL (ref 0–200)
HDL: 44.9 mg/dL (ref >39.00)
LDL Cholesterol: 39 mg/dL (ref 0–99)
NonHDL: 58.12
Total CHOL/HDL Ratio: 2
Triglycerides: 94 mg/dL (ref 0.0–149.0)
VLDL: 18.8 mg/dL (ref 0.0–40.0)

## 2023-06-15 LAB — FOLATE: Folate: >25.2 ng/mL (ref >5.9)

## 2023-06-15 LAB — VITAMIN B12: Vitamin B-12: 359 pg/mL (ref 211–911)

## 2023-06-15 LAB — HEMOGLOBIN A1C: Hgb A1c MFr Bld: 7.6 % — ABNORMAL HIGH (ref 4.6–6.5)

## 2023-06-15 MED ORDER — FREESTYLE LIBRE 3 SENSOR MISC: 1.0000 | Freq: Every day | 5 refills | Status: DC

## 2023-06-15 MED ORDER — FREESTYLE LIBRE 3 READER DEVI: 1.0000 | Freq: Every day | 3 refills | Status: DC

## 2023-06-15 MED ORDER — TOUJEO MAX SOLOSTAR 300 UNIT/ML ~~LOC~~ SOPN: 30.0000 [IU] | PEN_INJECTOR | Freq: Every day | SUBCUTANEOUS | 0 refills | Status: DC

## 2023-06-15 MED ORDER — METFORMIN HCL ER 500 MG PO TB24: 500.0000 mg | ORAL_TABLET | Freq: Two times a day (BID) | ORAL | 0 refills | Status: DC

## 2023-06-15 NOTE — Patient Instructions (Signed)

## 2023-06-15 NOTE — Progress Notes (Unsigned)
Subjective:  Patient ID: Michael Ashley, male    DOB: 11-Jul-1950  Age: 73 y.o. MRN: [id number]  CC: Diabetes   HPI Michael Ashley presents for f/up ----  Discussed the use of AI scribe software for clinical note transcription with the patient, who gave verbal consent to proceed.  History of Present Illness   Michael Ashley is a 73 year old male who presents with changes in taste and smell.  He has experienced a milder sense of taste and smell for the past couple of months. Initially, he thought these changes would resolve on his own, but they have persisted. He denies any history of COVID-19 infection, although he has been tested for it. No difficulty swallowing or painful swallowing is reported.  He is currently on metformin, which was recently reduced to 500 mg once daily from 1000 mg twice daily. He has noticed an increase in his blood sugar levels, now ranging from 130 to 140, compared to previous levels of 100 or below. He is also on insulin, with a current dosage of 30 units. No chest pain, shortness of breath, dizziness, lightheadedness, or swelling. No trouble with his feet.  He mentions a long-standing issue with balance, which he attributes to a spinal condition diagnosed approximately eight years ago. At that time, an x-ray revealed a crooked spine, and surgery was suggested, but he declined. He notes that his balance has worsened over the years, requiring him to hold onto something for stability. He denies any vitamin deficiencies, including B12 deficiency.       Outpatient Medications Prior to Visit  Medication Sig Dispense Refill   aspirin EC 81 MG tablet Take 1 tablet (81 mg total) by mouth daily. Swallow whole. 30 tablet 12   atorvastatin (LIPITOR) 20 MG tablet Take 1 tablet by mouth once daily 90 tablet 0   FARXIGA 10 MG TABS tablet Take 1 tablet by mouth once daily 90 tablet 0   potassium chloride (KLOR-CON) 10 MEQ tablet TAKE 1 TABLET BY MOUTH THREE TIMES DAILY 90 tablet 0    metFORMIN (GLUCOPHAGE-XR) 500 MG 24 hr tablet Take 1 tablet (500 mg total) by mouth daily with breakfast. 90 tablet 0   TOUJEO MAX SOLOSTAR 300 UNIT/ML Solostar Pen INJECT 30 UNITS SUBCUTANEOUSLY ONCE DAILY 9 mL 0   NOVOFINE PEN NEEDLE 32G X 6 MM MISC USE 1  PEN NEEDLE ONCE DAILY (Patient not taking: Reported on 06/15/2023) 100 each 5   Continuous Blood Gluc Receiver (FREESTYLE LIBRE 3 READER) DEVI 1 Act by Does not apply route daily. (Patient not taking: Reported on 06/15/2023) 1 each 3   Continuous Blood Gluc Sensor (FREESTYLE LIBRE 3 SENSOR) MISC 1 Act by Does not apply route daily. Place 1 sensor on the skin every 14 days. Use to check glucose continuously (Patient not taking: Reported on 06/15/2023) 2 each 5   ondansetron (ZOFRAN) 4 MG tablet Take 1 tablet (4 mg total) by mouth every 6 (six) hours. 12 tablet 0   No facility-administered medications prior to visit.    ROS Review of Systems  Objective:  BP (!) 146/86 (BP Location: Left Arm, Patient Position: Sitting, Cuff Size: Normal)   Pulse 76   Temp 98 F (36.7 C) (Oral)   Resp 16   Ht 5' 9.5" (1.765 m)   Wt 197 lb (89.4 kg)   SpO2 97%   BMI 28.67 kg/m   BP Readings from Last 3 Encounters:  06/15/23 (!) 146/86  05/04/23 134/78  04/28/23 115/86  Wt Readings from Last 3 Encounters:  06/15/23 197 lb (89.4 kg)  05/04/23 196 lb 3.2 oz (89 kg)  04/28/23 200 lb (90.7 kg)    Physical Exam  Lab Results  Component Value Date   WBC 8.2 04/28/2023   HGB 16.2 04/28/2023   HCT 48.2 04/28/2023   PLT 231 04/28/2023   GLUCOSE 125 (H) 06/15/2023   CHOL 103 06/15/2023   TRIG 94.0 06/15/2023   HDL 44.90 06/15/2023   LDLDIRECT 84.0 10/10/2015   LDLCALC 39 06/15/2023   ALT 27 05/04/2023   AST 32 05/04/2023   NA 137 06/15/2023   K 4.1 06/15/2023   CL 101 06/15/2023   CREATININE 1.07 06/15/2023   BUN 21 06/15/2023   CO2 28 06/15/2023   TSH 1.11 05/04/2023   PSA 1.25 08/12/2022   HGBA1C 7.6 (H) 06/15/2023   MICROALBUR 1.0  05/04/2023    No results found.  Assessment & Plan:  Tongue discoloration -     Folate; Future -     Vitamin B12; Future  Type II diabetes mellitus with manifestations (HCC) -     Hemoglobin A1c; Future -     Folate; Future -     Basic metabolic panel; Future -     Vitamin B12; Future -     HM Diabetes Foot Exam  Hypokalemia due to loss of potassium -     Basic metabolic panel; Future  Hyperlipidemia with target LDL less than 70 -     Lipid panel; Future  Stage 3b chronic kidney disease (HCC)  Insulin-requiring or dependent type II diabetes mellitus (HCC) -     metFORMIN HCl ER; Take 1 tablet (500 mg total) by mouth 2 (two) times daily with a meal.  Dispense: 180 tablet; Refill: 0 -     Toujeo Max SoloStar; Inject 30 Units into the skin daily.  Dispense: 9 mL; Refill: 0 -     FreeStyle Libre 3 Reader; 1 Act by Does not apply route daily.  Dispense: 1 each; Refill: 3 -     FreeStyle Libre 3 Sensor; 1 Act by Does not apply route daily. Place 1 sensor on the skin every 14 days. Use to check glucose continuously  Dispense: 2 each; Refill: 5 -     AMB Referral [facility] Care Management     Follow-up: Return in about 4 months (around 10/13/2023).  Sanda Linger, MD

## 2023-06-16 ENCOUNTER — Encounter: Payer: Self-pay | Admitting: Internal Medicine

## 2023-06-27 ENCOUNTER — Other Ambulatory Visit: Payer: Self-pay | Admitting: Internal Medicine

## 2023-06-27 DIAGNOSIS — E876 Hypokalemia: Secondary | ICD-10-CM

## 2023-06-28 ENCOUNTER — Other Ambulatory Visit (INDEPENDENT_AMBULATORY_CARE_PROVIDER_SITE_OTHER): Payer: No Typology Code available for payment source | Admitting: Pharmacist

## 2023-06-28 DIAGNOSIS — E118 Type 2 diabetes mellitus with unspecified complications: Secondary | ICD-10-CM

## 2023-06-28 NOTE — Progress Notes (Signed)
    06/28/2023 Name: Michael Ashley MRN: [id number] DOB: 01/14/1951  Chief Complaint  Patient presents with   Diabetes   Medication Management   Michael Ashley is a 73 y.o. year old male who presented for a telephone visit.   They were referred to the pharmacist by their PCP for assistance in managing diabetes.      Subjective:  Care Team: Primary Care Provider: Etta Grandchild, MD   Medication Access/Adherence  Current Pharmacy:  [facility] - [address] ([place]), [place] - [address] [place] ([place]) [place] [address] Phone: [phone] Fax: [phone]  -Patient reports affordability concerns with their medications: No  -Patient reports access/transportation concerns to their pharmacy: No  -Patient reports adherence concerns with their medications:  No    Diabetes: Current medications: Farxiga 10mg  daily, metformin xr 500 mg 1 tablet twice daily, Toujeo Max 30 units daily -Current glucose readings: Fasting 130-140, before dinner 100-110 -He is prescribed Libre 3 CGM and has this at home but is not using.  Continues to check FBG daily with glucometer/strips/lancets -Pt notes he has been eating pasta more lately, eating more later in the evening and feels this is causing BG to be elevated in the AM -Patient denies hypoglycemic s/sx including dizziness, shakiness, sweating.  -Patient denies hyperglycemic symptoms including polyuria, polydipsia, polyphagia, nocturia, neuropathy, blurred vision. -States [name] is covered on insurance and affordable to him  Objective: Lab Results  Component Value Date   HGBA1C 7.6 (H) 06/15/2023   Lab Results  Component Value Date   CREATININE 1.07 06/15/2023   BUN 21 06/15/2023   NA 137 06/15/2023   K 4.1 06/15/2023   CL 101 06/15/2023   CO2 28 06/15/2023   Medications Reviewed Today   Medications were not reviewed in this encounter    Assessment/Plan:   Diabetes: - Currently uncontrolled, A1c goal  <7.5%, A1c much improved from 9.5 previously. Fasting BG slightly elevated.  - Reviewed goal A1c, goal fasting, and goal 2 hour post prandial glucose - Reviewed dietary modifications including elimination drinks with added sugar, decreasing carbohydrate and sugar intake, and increasing lean proteins - Reviewed lifestyle modifications including: increasing activity level - reviewed s/sx hypoglycemia to monitor for and how to treat - Pt plans to work on diet. Consider increasing metformin to 2 tablets BID if BG remain above goal.  Follow Up Plan: 4/7  Arbutus Leas, PharmD, BCPS, CPP Clinical Pharmacist Practitioner [facility] at [facility] [phone]

## 2023-06-28 NOTE — Patient Instructions (Signed)
 It was a pleasure speaking with you today!    Feel free to call with any questions or concerns!  Arbutus Leas, PharmD, BCPS, CPP Clinical Pharmacist Practitioner [facility] at [facility] [phone]

## 2023-06-29 ENCOUNTER — Other Ambulatory Visit: Payer: Self-pay | Admitting: Internal Medicine

## 2023-07-27 ENCOUNTER — Other Ambulatory Visit: Payer: Self-pay | Admitting: Internal Medicine

## 2023-07-27 NOTE — Telephone Encounter (Unsigned)
 Copied from CRM [id number]. Topic: Clinical - Medication Refill >> Jul 27, 2023  8:17 AM Alcus Dad wrote: Most Recent Primary Care Visit:  Provider: Candy Sledge R  Department: [facility]  Visit Type: PATIENT OUTREACH 30  Date: 06/28/2023  Medication: atorvastatin (LIPITOR) 20 MG tablet FARXIGA 10 MG TABS tablet metFORMIN (GLUCOPHAGE-XR) 500 MG 24 hr tablet potassium chloride (KLOR-CON) 10 MEQ tablet  Has the patient contacted their pharmacy? No (Agent: If no, request that the patient contact the pharmacy for the refill. If patient does not wish to contact the pharmacy document the reason why and proceed with request.) (Agent: If yes, when and what did the pharmacy advise?)  Is this the correct pharmacy for this prescription? Yes If no, delete pharmacy and type the correct one.  This is the patient's preferred pharmacy:  [facility] - [place] ([place]), [place] - [address] [place] ([place]) [place] [address] Phone: [phone] Fax: [phone]   Has the prescription been filled recently? Yes  Is the patient out of the medication? Yes  Has the patient been seen for an appointment in the last year OR does the patient have an upcoming appointment? Yes  Can we respond through MyChart? Yes  **Patient lost all of his medication and needs a refill**  Agent: Please be advised that Rx refills may take up to 3 business days. We ask that you follow-up with your pharmacy.

## 2023-07-28 ENCOUNTER — Other Ambulatory Visit: Payer: Self-pay | Admitting: Internal Medicine

## 2023-07-28 DIAGNOSIS — E118 Type 2 diabetes mellitus with unspecified complications: Secondary | ICD-10-CM

## 2023-07-28 DIAGNOSIS — E876 Hypokalemia: Secondary | ICD-10-CM

## 2023-07-28 DIAGNOSIS — E785 Hyperlipidemia, unspecified: Secondary | ICD-10-CM

## 2023-07-28 DIAGNOSIS — E119 Type 2 diabetes mellitus without complications: Secondary | ICD-10-CM

## 2023-07-28 DIAGNOSIS — N1831 Chronic kidney disease, stage 3a: Secondary | ICD-10-CM

## 2023-07-28 MED ORDER — POTASSIUM CHLORIDE ER 10 MEQ PO TBCR
10.0000 meq | EXTENDED_RELEASE_TABLET | Freq: Three times a day (TID) | ORAL | 0 refills | Status: DC
Start: 2023-07-28 — End: 2023-10-29

## 2023-07-28 MED ORDER — FARXIGA 10 MG PO TABS: 10.0000 mg | ORAL_TABLET | Freq: Every day | ORAL | 0 refills | Status: DC

## 2023-07-28 MED ORDER — ATORVASTATIN CALCIUM 20 MG PO TABS: 20.0000 mg | ORAL_TABLET | Freq: Every day | ORAL | 0 refills | Status: DC

## 2023-07-28 MED ORDER — METFORMIN HCL ER 500 MG PO TB24: 500.0000 mg | ORAL_TABLET | Freq: Two times a day (BID) | ORAL | 0 refills | Status: DC

## 2023-08-03 ENCOUNTER — Other Ambulatory Visit: Payer: Self-pay | Admitting: Internal Medicine

## 2023-08-03 DIAGNOSIS — E119 Type 2 diabetes mellitus without complications: Secondary | ICD-10-CM

## 2023-08-03 MED ORDER — NOVOFINE PEN NEEDLE 32G X 6 MM MISC: 5 refills | Status: AC

## 2023-08-03 NOTE — Telephone Encounter (Signed)
 Copied from CRM [id number]. Topic: Clinical - Medication Refill >> Aug 03, 2023  8:25 AM Pascal Lux wrote: Most Recent Primary Care Visit:  Provider: Candy Sledge R  Department[facility]  Visit Type: PATIENT OUTREACH 30  Date: 06/28/2023  Medication: NOVOFINE PEN NEEDLE 32G X 6 MM MISC [[id number]]  Has the patient contacted their pharmacy? No (Agent: If no, request that the patient contact the pharmacy for the refill. If patient does not wish to contact the pharmacy document the reason why and proceed with request.) (Agent: If yes, when and what did the pharmacy advise?)  Is this the correct pharmacy for this prescription? Yes If no, delete pharmacy and type the correct one.  This is the patient's preferred pharmacy:  [facility] - [place] ([place]), [place] - [address] [place] ([place]) [place] [address] Phone: [phone] Fax: [phone]   Has the prescription been filled recently? No  Is the patient out of the medication? Yes, he lost them. Only has 2 days left of needles.  Has the patient been seen for an appointment in the last year OR does the patient have an upcoming appointment? Yes  Can we respond through MyChart? No  Agent: Please be advised that Rx refills may take up to 3 business days. We ask that you follow-up with your pharmacy.

## 2023-08-05 ENCOUNTER — Other Ambulatory Visit: Payer: Self-pay | Admitting: Internal Medicine

## 2023-08-05 DIAGNOSIS — E118 Type 2 diabetes mellitus with unspecified complications: Secondary | ICD-10-CM

## 2023-08-05 DIAGNOSIS — N1831 Chronic kidney disease, stage 3a: Secondary | ICD-10-CM

## 2023-08-09 ENCOUNTER — Other Ambulatory Visit (INDEPENDENT_AMBULATORY_CARE_PROVIDER_SITE_OTHER): Payer: No Typology Code available for payment source | Admitting: Pharmacist

## 2023-08-09 DIAGNOSIS — E118 Type 2 diabetes mellitus with unspecified complications: Secondary | ICD-10-CM

## 2023-08-09 NOTE — Progress Notes (Signed)
 08/09/2023 Name: Michael Ashley MRN: [id number] DOB: 26-Feb-1951  Chief Complaint  Patient presents with   Diabetes   Medication Management   Lynton Wickersham is a 73 y.o. year old male who presented for a telephone visit.   They were referred to the pharmacist by their PCP for assistance in managing diabetes.   Subjective:  Care Team: Primary Care Provider: Etta Grandchild, MD   Medication Access/Adherence  Current Pharmacy:  [facility] - [address] ([place]), [place] - [address] [place] ([place]) [place] [address] Phone: [phone] Fax: [phone]  -Patient reports affordability concerns with their medications: No  -Patient reports access/transportation concerns to their pharmacy: No  -Patient reports adherence concerns with their medications:  No    Diabetes: Current medications: Farxiga 10mg  daily, metformin xr 500 mg 1 tablet twice daily, Toujeo Max 30 units daily -Current glucose readings: Fasting have been 120-130 but was 297 this morning (he states this is due to eating 4 slices of plain butter bread prior to bed last night). He notes his BG is never this high. -He is prescribed Libre 3 CGM and has this at home but is not using.  Continues to check FBG daily with glucometer/strips/lancets -Patient denies hypoglycemic s/sx including dizziness, shakiness, sweating.  -Patient denies hyperglycemic symptoms including polyuria, polydipsia, polyphagia, nocturia, neuropathy, blurred vision. -States [name] is covered on insurance and affordable to him  Objective: Lab Results  Component Value Date   HGBA1C 7.6 (H) 06/15/2023   Lab Results  Component Value Date   CREATININE 1.07 06/15/2023   BUN 21 06/15/2023   NA 137 06/15/2023   K 4.1 06/15/2023   CL 101 06/15/2023   CO2 28 06/15/2023   Medications Reviewed Today     Reviewed by Bonita Quin, RPH (Pharmacist) on 08/09/23 at 1623  Med List Status: <None>   Medication Order  Taking? Sig Documenting Provider Last Dose Status Informant  aspirin EC 81 MG tablet [id number] Yes Take 1 tablet (81 mg total) by mouth daily. Swallow whole. Maisie Fus, MD Taking Active   atorvastatin (LIPITOR) 20 MG tablet [id number] Yes Take 1 tablet (20 mg total) by mouth daily. Etta Grandchild, MD Taking Active   Continuous Glucose Receiver (FREESTYLE LIBRE 3 READER) DEVI [id number]  1 Act by Does not apply route daily. Etta Grandchild, MD  Active   Continuous Glucose Sensor (FREESTYLE LIBRE 3 SENSOR) [place] [id number]  1 Act by Does not apply route daily. Place 1 sensor on the skin every 14 days. Use to check glucose continuously Etta Grandchild, MD  Active   FARXIGA 10 MG TABS tablet [id number] Yes Take 1 tablet (10 mg total) by mouth daily. Etta Grandchild, MD Taking Active   insulin glargine, 2 Unit Dial, (TOUJEO MAX SOLOSTAR) 300 UNIT/ML Solostar Pen [id number] Yes Inject 30 Units into the skin daily. Etta Grandchild, MD Taking Active   Insulin Pen Needle (NOVOFINE PEN NEEDLE) 32G X 6 MM MISC [id number]  USE 1  PEN NEEDLE ONCE DAILY Etta Grandchild, MD  Active   metFORMIN (GLUCOPHAGE-XR) 500 MG 24 hr tablet [id number] Yes Take 1 tablet (500 mg total) by mouth 2 (two) times daily with a meal. Etta Grandchild, MD Taking Active   potassium chloride (KLOR-CON) 10 MEQ tablet [id number] Yes Take 1 tablet (10 mEq total) by mouth 3 (three) times daily. Etta Grandchild, MD Taking Active  Assessment/Plan:   Diabetes: - Currently uncontrolled, A1c goal <7.5%, A1c much improved from 9.5 previously.  - Reviewed goal A1c, goal fasting, and goal 2 hour post prandial glucose - Reviewed dietary modifications including elimination drinks with added sugar, decreasing carbohydrate and sugar intake, and increasing lean proteins - Reviewed lifestyle modifications including: increasing activity level - reviewed s/sx hypoglycemia to monitor for and how to treat - Pt plans to continue working on diet.  Consider increasing metformin to 2 tablets BID if BG remain above goal.  Follow Up Plan: PCP f/u 5/12  Arbutus Leas, PharmD, BCPS, CPP Clinical Pharmacist Practitioner [facility] at [facility] [phone]

## 2023-08-09 NOTE — Patient Instructions (Signed)
 It was a pleasure speaking with you today!  Continue your current regimen. Continue working on diet/exercise as able.  Feel free to call with any questions or concerns!  Arbutus Leas, PharmD, BCPS, CPP Clinical Pharmacist Practitioner [facility] at [facility] [phone]

## 2023-08-10 DIAGNOSIS — M545 Low back pain, unspecified: Secondary | ICD-10-CM | POA: Insufficient documentation

## 2023-08-10 DIAGNOSIS — M438X6 Other specified deforming dorsopathies, lumbar region: Secondary | ICD-10-CM | POA: Diagnosis not present

## 2023-08-10 DIAGNOSIS — M5451 Vertebrogenic low back pain: Secondary | ICD-10-CM | POA: Diagnosis not present

## 2023-08-11 ENCOUNTER — Telehealth: Payer: Self-pay | Admitting: Internal Medicine

## 2023-08-11 NOTE — Telephone Encounter (Unsigned)
 Copied from CRM [id number]. Topic: Referral - Request for Referral >> Aug 11, 2023 10:22 AM Denese Killings wrote: Did the patient discuss referral with their provider in the last year? Yes (If No - schedule appointment) (If Yes - send message)  Appointment offered? No  Type of order/referral and detailed reason for visit: Neurology  Preference of office, provider, location: [facility]. [facility] [address] [place], [place]  If referral order, have you been seen by this specialty before? Yes (If Yes, this issue or another issue? When? Where? More than 5 years ago [facility]  Can we respond through MyChart? Yes

## 2023-08-13 NOTE — Telephone Encounter (Signed)
 Incorrect note. Patient doesn't need any referrals.

## 2023-08-18 DIAGNOSIS — M5451 Vertebrogenic low back pain: Secondary | ICD-10-CM | POA: Diagnosis not present

## 2023-08-25 DIAGNOSIS — M5451 Vertebrogenic low back pain: Secondary | ICD-10-CM | POA: Diagnosis not present

## 2023-09-01 DIAGNOSIS — M5451 Vertebrogenic low back pain: Secondary | ICD-10-CM | POA: Diagnosis not present

## 2023-09-02 ENCOUNTER — Ambulatory Visit: Payer: Self-pay

## 2023-09-02 ENCOUNTER — Other Ambulatory Visit: Payer: Self-pay | Admitting: Internal Medicine

## 2023-09-02 ENCOUNTER — Encounter: Payer: Self-pay | Admitting: Internal Medicine

## 2023-09-02 NOTE — Telephone Encounter (Signed)
 Chief Complaint: "electric current" in his hands Symptoms: see above Frequency: "couple of days" Pertinent Negatives: Patient denies CP, SOB, one-sided weakness or numbness, slurred speech, H/A, N/V, unsteady gait Disposition: [] ED /[] Urgent Care (no appt availability in office) / [x] Appointment(In office/virtual)/ []  [facility]/ [] Home Care/ [] Refused Recommended Disposition /[] [facility]/ []  Follow-up with PCP Additional Notes: Pt reports an "electric current" sensation in his bilateral hands for a couple of days. Pt denies pain. Pt was prescribed gabapentin  for the same sensation in his feet previously. Pt states this feels similar. Pt denies all other symptoms. Denies stroke-like symptoms. RN scheduled pt for tomorrow at 1000. RN advised pt if he develops SOB, CP, one-sided weakness or numbness, difficulty walking, or other worsening to seek care in the ED. Pt verbalized understanding.     Reason for Disposition  [1] Numbness or tingling in one or both hands AND [2] is a chronic symptom (recurrent or ongoing AND present > 4 weeks)  Answer Assessment - Initial Assessment Questions 1. SYMPTOM: "What is the main symptom you are concerned about?" (e.g., weakness, numbness)     "Electric current" (tingling) in both hands 2. ONSET: "When did this start?" (minutes, hours, days; while sleeping)     Couple of days ago 3. LAST NORMAL: "When was the last time you (the patient) were normal (no symptoms)?"     Couple of days ago 4. PATTERN "Does this come and go, or has it been constant since it started?"  "Is it present now?"     Comes and goes 5. CARDIAC SYMPTOMS: "Have you had any of the following symptoms: chest pain, difficulty breathing, palpitations?"     Denies SOB and CP. Denies one-sided weakness or numbness. Denies slurred. Denies H/A, blurry vision, falls, head strikes.  6. NEUROLOGIC SYMPTOMS: "Have you had any of the following symptoms: headache, dizziness,  vision loss, double vision, changes in speech, unsteady on your feet?"     Pt states he used to take 100mg  and then 300mg  of gabapentin  for "electric current" in his feet. Pt states electric current sensation comes and goes, he can feel it in his hands while driving.  Protocols used: Neurologic Deficit-A-AH

## 2023-09-02 NOTE — Progress Notes (Signed)
 Subjective:    Patient ID: Michael  Ashley, male    DOB: 30-Mar-1951, 73 y.o.   MRN: [id number]      HPI Carlson  is here for  Chief Complaint  Patient presents with   Numbness    Numbness in both hands     He is diabetic neuropathy in his feet and has had that for years.  Few days ago he started having sensation of electric current in his hands.  This is what his feet felt like when the neuropathy started and he thinks he is getting it in his hands.  He states it is annoying.  He denies any burning sensation or true persistent pain.  He denies any weakness in the hands.   He wondered about restarting gabapentin  which is what he took when his feet started.  He is not sure how much it helped, but wanted to try it.       Medications and allergies reviewed with patient and updated if appropriate.  Current Outpatient Medications on File Prior to Visit  Medication Sig Dispense Refill   aspirin  EC 81 MG tablet Take 1 tablet (81 mg total) by mouth daily. Swallow whole. 30 tablet 12   atorvastatin  (LIPITOR) 20 MG tablet Take 1 tablet (20 mg total) by mouth daily. 90 tablet 0   Continuous Glucose Receiver (FREESTYLE LIBRE 3 READER) DEVI 1 Act by Does not apply route daily. 1 each 3   Continuous Glucose Sensor (FREESTYLE LIBRE 3 SENSOR) MISC 1 Act by Does not apply route daily. Place 1 sensor on the skin every 14 days. Use to check glucose continuously 2 each 5   FARXIGA  10 MG TABS tablet Take 1 tablet (10 mg total) by mouth daily. 90 tablet 0   insulin  glargine, 2 Unit Dial, (TOUJEO  MAX SOLOSTAR) 300 UNIT/ML Solostar Pen Inject 30 Units into the skin daily. 9 mL 0   Insulin  Pen Needle (NOVOFINE PEN NEEDLE) 32G X 6 MM MISC USE 1  PEN NEEDLE ONCE DAILY 100 each 5   metFORMIN  (GLUCOPHAGE -XR) 500 MG 24 hr tablet Take 1 tablet (500 mg total) by mouth 2 (two) times daily with a meal. 180 tablet 0   potassium chloride  (KLOR-CON ) 10 MEQ tablet Take 1 tablet (10 mEq total) by mouth 3 (three) times  daily. 270 tablet 0   No current facility-administered medications on file prior to visit.    Review of Systems     Objective:   Vitals:   09/03/23 0952  BP: 124/78  Pulse: (!) 58  Temp: 97.9 F (36.6 C)  SpO2: 94%   BP Readings from Last 3 Encounters:  09/03/23 124/78  06/15/23 (!) 146/86  05/04/23 134/78   Wt Readings from Last 3 Encounters:  09/03/23 199 lb (90.3 kg)  06/15/23 197 lb (89.4 kg)  05/04/23 196 lb 3.2 oz (89 kg)   Body mass index is 28.97 kg/m.    Physical Exam Constitutional:      General: He is not in acute distress.    Appearance: Normal appearance. He is not ill-appearing.  HENT:     Head: Normocephalic and atraumatic.  Musculoskeletal:        General: No swelling, tenderness or deformity (In bilateral hands).  Skin:    General: Skin is warm and dry.     Findings: No erythema or rash.  Neurological:     Mental Status: He is alert.     Sensory: No sensory deficit (Bilateral hands).     Motor:  No weakness (In bilateral hands).            Assessment & Plan:    Diabetic neuropathy: Chronic Has had diabetic neuropathy in his feet for years Not currently on any medication-has been able to tolerate the symptoms Just recently started having electric-like sensation in his hands-likely beginning of neuropathy in the hands as well Will start gabapentin  300 mg twice daily to see if that helps.  Discussed possible side effects He has an upcoming appointment with his PCP and can follow-up Stressed the most important thing for preventing the neuropathy from getting worse is to get his sugars better controlled Deferred nutrition referral Stressed decreasing carbohydrate intake-rice, pasta which she has noticed throws up his sugars

## 2023-09-02 NOTE — Telephone Encounter (Signed)
 See triage encounter.  Copied from CRM [id number]. Topic: Clinical - Medication Refill >> Sep 02, 2023  9:14 AM Howard Macho wrote: Most Recent Primary Care Visit:  Provider: Lawana Pray R  Department: [facility]  Visit Type: PATIENT OUTREACH 30  Date: 08/09/2023  Medication: gabapentin  300 mg  Has the patient contacted their pharmacy?yes (Agent: If no, request that the patient contact the pharmacy for the refill. If patient does not wish to contact the pharmacy document the reason why and proceed with request.) (Agent: If yes, when and what did the pharmacy advise?)  Is this the correct pharmacy for this prescription? Yes If no, delete pharmacy and type the correct one.  This is the patient's preferred pharmacy:  [facility] - [place] ([place]), [place] - [address] [place] ([place]) [place] [address] Phone: [phone] Fax: [phone]   Has the prescription been filled recently? No  Is the patient out of the medication? Yes  Has the patient been seen for an appointment in the last year OR does the patient have an upcoming appointment? Yes  Can we respond through MyChart? Yes  Agent: Please be advised that Rx refills may take up to 3 business days. We ask that you follow-up with your pharmacy.

## 2023-09-03 ENCOUNTER — Ambulatory Visit (INDEPENDENT_AMBULATORY_CARE_PROVIDER_SITE_OTHER): Admitting: Internal Medicine

## 2023-09-03 VITALS — BP 124/78 | HR 58 | Temp 97.9°F | Ht 69.5 in | Wt 199.0 lb

## 2023-09-03 DIAGNOSIS — E1142 Type 2 diabetes mellitus with diabetic polyneuropathy: Secondary | ICD-10-CM | POA: Diagnosis not present

## 2023-09-03 MED ORDER — GABAPENTIN 300 MG PO CAPS: 300.0000 mg | ORAL_CAPSULE | Freq: Two times a day (BID) | ORAL | 5 refills | Status: DC

## 2023-09-03 NOTE — Patient Instructions (Addendum)
        Medications changes include :   gabapentin  300 mg twice a day   Try to get your sugars better controlled    Return for follow up as scheduled with Dr Rochelle Chu.

## 2023-09-09 DIAGNOSIS — M5451 Vertebrogenic low back pain: Secondary | ICD-10-CM | POA: Diagnosis not present

## 2023-09-13 ENCOUNTER — Ambulatory Visit (INDEPENDENT_AMBULATORY_CARE_PROVIDER_SITE_OTHER): Admitting: Internal Medicine

## 2023-09-13 ENCOUNTER — Encounter: Payer: Self-pay | Admitting: Internal Medicine

## 2023-09-13 VITALS — BP 150/86 | HR 68 | Temp 97.9°F | Resp 16 | Ht 69.5 in | Wt 201.2 lb

## 2023-09-13 DIAGNOSIS — R270 Ataxia, unspecified: Secondary | ICD-10-CM | POA: Diagnosis not present

## 2023-09-13 DIAGNOSIS — Z1211 Encounter for screening for malignant neoplasm of colon: Secondary | ICD-10-CM | POA: Diagnosis not present

## 2023-09-13 DIAGNOSIS — Z7984 Long term (current) use of oral hypoglycemic drugs: Secondary | ICD-10-CM

## 2023-09-13 DIAGNOSIS — E118 Type 2 diabetes mellitus with unspecified complications: Secondary | ICD-10-CM

## 2023-09-13 DIAGNOSIS — E1140 Type 2 diabetes mellitus with diabetic neuropathy, unspecified: Secondary | ICD-10-CM | POA: Diagnosis not present

## 2023-09-13 DIAGNOSIS — I10 Essential (primary) hypertension: Secondary | ICD-10-CM

## 2023-09-13 DIAGNOSIS — E1142 Type 2 diabetes mellitus with diabetic polyneuropathy: Secondary | ICD-10-CM | POA: Diagnosis not present

## 2023-09-13 LAB — CBC WITH DIFFERENTIAL/PLATELET
Basophils Absolute: 0 10*3/uL (ref 0.0–0.1)
Basophils Relative: 0.5 % (ref 0.0–3.0)
Eosinophils Absolute: 0.3 10*3/uL (ref 0.0–0.7)
Eosinophils Relative: 5.7 % — ABNORMAL HIGH (ref 0.0–5.0)
HCT: 41.7 % (ref 39.0–52.0)
Hemoglobin: 13.7 g/dL (ref 13.0–17.0)
Lymphocytes Relative: 24.4 % (ref 12.0–46.0)
Lymphs Abs: 1.3 10*3/uL (ref 0.7–4.0)
MCHC: 32.9 g/dL (ref 30.0–36.0)
MCV: 94.3 fl (ref 78.0–100.0)
Monocytes Absolute: 0.5 10*3/uL (ref 0.1–1.0)
Monocytes Relative: 8.5 % (ref 3.0–12.0)
Neutro Abs: 3.3 10*3/uL (ref 1.4–7.7)
Neutrophils Relative %: 60.9 % (ref 43.0–77.0)
Platelets: 207 10*3/uL (ref 150.0–400.0)
RBC: 4.42 Mil/uL (ref 4.22–5.81)
RDW: 14.4 % (ref 11.5–15.5)
WBC: 5.4 10*3/uL (ref 4.0–10.5)

## 2023-09-13 LAB — BASIC METABOLIC PANEL WITH GFR
BUN: 26 mg/dL — ABNORMAL HIGH (ref 6–23)
CO2: 29 meq/L (ref 19–32)
Calcium: 9.3 mg/dL (ref 8.4–10.5)
Chloride: 102 meq/L (ref 96–112)
Creatinine, Ser: 1.12 mg/dL (ref 0.40–1.50)
GFR: 64.58 mL/min (ref >60.00)
Glucose, Bld: 172 mg/dL — ABNORMAL HIGH (ref 70–99)
Potassium: 4.3 meq/L (ref 3.5–5.1)
Sodium: 139 meq/L (ref 135–145)

## 2023-09-13 LAB — HEMOGLOBIN A1C: Hgb A1c MFr Bld: 8.3 % — ABNORMAL HIGH (ref 4.6–6.5)

## 2023-09-13 MED ORDER — OLMESARTAN MEDOXOMIL 20 MG PO TABS: 20.0000 mg | ORAL_TABLET | Freq: Every day | ORAL | 0 refills | Status: DC

## 2023-09-13 NOTE — Patient Instructions (Signed)
 Hypertension, Adult High blood pressure (hypertension) is when the force of blood pumping through the arteries is too strong. The arteries are the blood vessels that carry blood from the heart throughout the body. Hypertension forces the heart to work harder to pump blood and may cause arteries to become narrow or stiff. Untreated or uncontrolled hypertension can lead to a heart attack, heart failure, a stroke, kidney disease, and other problems. A blood pressure reading consists of a higher number over a lower number. Ideally, your blood pressure should be below 120/80. The first ("top") number is called the systolic pressure. It is a measure of the pressure in your arteries as your heart beats. The second ("bottom") number is called the diastolic pressure. It is a measure of the pressure in your arteries as the heart relaxes. What are the causes? The exact cause of this condition is not known. There are some conditions that result in high blood pressure. What increases the risk? Certain factors may make you more likely to develop high blood pressure. Some of these risk factors are under your control, including: Smoking. Not getting enough exercise or physical activity. Being overweight. Having too much fat, sugar, calories, or salt (sodium) in your diet. Drinking too much alcohol. Other risk factors include: Having a personal history of heart disease, diabetes, high cholesterol, or kidney disease. Stress. Having a family history of high blood pressure and high cholesterol. Having obstructive sleep apnea. Age. The risk increases with age. What are the signs or symptoms? High blood pressure may not cause symptoms. Very high blood pressure (hypertensive crisis) may cause: Headache. Fast or irregular heartbeats (palpitations). Shortness of breath. Nosebleed. Nausea and vomiting. Vision changes. Severe chest pain, dizziness, and seizures. How is this diagnosed? This condition is diagnosed by  measuring your blood pressure while you are seated, with your arm resting on a flat surface, your legs uncrossed, and your feet flat on the floor. The cuff of the blood pressure monitor will be placed directly against the skin of your upper arm at the level of your heart. Blood pressure should be measured at least twice using the same arm. Certain conditions can cause a difference in blood pressure between your right and left arms. If you have a high blood pressure reading during one visit or you have normal blood pressure with other risk factors, you may be asked to: Return on a different day to have your blood pressure checked again. Monitor your blood pressure at home for 1 week or longer. If you are diagnosed with hypertension, you may have other blood or imaging tests to help your health care provider understand your overall risk for other conditions. How is this treated? This condition is treated by making healthy lifestyle changes, such as eating healthy foods, exercising more, and reducing your alcohol intake. You may be referred for counseling on a healthy diet and physical activity. Your health care provider may prescribe medicine if lifestyle changes are not enough to get your blood pressure under control and if: Your systolic blood pressure is above 130. Your diastolic blood pressure is above 80. Your personal target blood pressure may vary depending on your medical conditions, your age, and other factors. Follow these instructions at home: Eating and drinking  Eat a diet that is high in fiber and potassium, and low in sodium, added sugar, and fat. An example of this eating plan is called the DASH diet. DASH stands for Dietary Approaches to Stop Hypertension. To eat this way: Eat  plenty of fresh fruits and vegetables. Try to fill one half of your plate at each meal with fruits and vegetables. Eat whole grains, such as whole-wheat pasta, brown rice, or whole-grain bread. Fill about one  fourth of your plate with whole grains. Eat or drink low-fat dairy products, such as skim milk or low-fat yogurt. Avoid fatty cuts of meat, processed or cured meats, and poultry with skin. Fill about one fourth of your plate with lean proteins, such as fish, chicken without skin, beans, eggs, or tofu. Avoid pre-made and processed foods. These tend to be higher in sodium, added sugar, and fat. Reduce your daily sodium intake. Many people with hypertension should eat less than 1,500 mg of sodium a day. Do not drink alcohol if: Your health care provider tells you not to drink. You are pregnant, may be pregnant, or are planning to become pregnant. If you drink alcohol: Limit how much you have to: 0-1 drink a day for women. 0-2 drinks a day for men. Know how much alcohol is in your drink. In the [place] one drink equals one 12 oz bottle of beer (355 mL), one 5 oz glass of wine (148 mL), or one 1 oz glass of hard liquor (44 mL). Lifestyle  Work with your health care provider to maintain a healthy body weight or to lose weight. Ask what an ideal weight is for you. Get at least 30 minutes of exercise that causes your heart to beat faster (aerobic exercise) most days of the week. Activities may include walking, swimming, or biking. Include exercise to strengthen your muscles (resistance exercise), such as Pilates or lifting weights, as part of your weekly exercise routine. Try to do these types of exercises for 30 minutes at least 3 days a week. Do not use any products that contain nicotine or tobacco. These products include cigarettes, chewing tobacco, and vaping devices, such as e-cigarettes. If you need help quitting, ask your health care provider. Monitor your blood pressure at home as told by your health care provider. Keep all follow-up visits. This is important. Medicines Take over-the-counter and prescription medicines only as told by your health care provider. Follow directions carefully. Blood  pressure medicines must be taken as prescribed. Do not skip doses of blood pressure medicine. Doing this puts you at risk for problems and can make the medicine less effective. Ask your health care provider about side effects or reactions to medicines that you should watch for. Contact a health care provider if you: Think you are having a reaction to a medicine you are taking. Have headaches that keep coming back (recurring). Feel dizzy. Have swelling in your ankles. Have trouble with your vision. Get help right away if you: Develop a severe headache or confusion. Have unusual weakness or numbness. Feel faint. Have severe pain in your chest or abdomen. Vomit repeatedly. Have trouble breathing. These symptoms may be an emergency. Get help right away. Call 911. Do not wait to see if the symptoms will go away. Do not drive yourself to the hospital. Summary Hypertension is when the force of blood pumping through your arteries is too strong. If this condition is not controlled, it may put you at risk for serious complications. Your personal target blood pressure may vary depending on your medical conditions, your age, and other factors. For most people, a normal blood pressure is less than 120/80. Hypertension is treated with lifestyle changes, medicines, or a combination of both. Lifestyle changes include losing weight, eating a healthy,  low-sodium diet, exercising more, and limiting alcohol. This information is not intended to replace advice given to you by your health care provider. Make sure you discuss any questions you have with your health care provider. Document Revised: 02/25/2021 Document Reviewed: 02/25/2021 Elsevier Patient Education  2024 [employer].

## 2023-09-13 NOTE — Progress Notes (Unsigned)
 Subjective:  Patient ID: Michael  Ashley, male    DOB: 06-25-1950  Age: 73 y.o. MRN: [id number]  CC: Medical Management of Chronic Issues (3 month follow up ), Hypertension, and Diabetes   HPI Makael  Sligar presents for f/up ---    Discussed the use of AI scribe software for clinical note transcription with the patient, who gave verbal consent to proceed.  History of Present Illness   Michael  Ashley is a 73 year old male who presents with tingling sensations in his hand and balance issues.  He experiences a tingling sensation in his hand, described as feeling like an 'electric current.' This sensation has become more prominent and regular. He has been taking gabapentin  300 mg, which was prescribed last week, and notes feeling a little better with this medication.  He reports significant balance issues, stating 'I have no balance.' He has been undergoing therapy for balance problems, which he started after consulting with a neurosurgeon a long time ago. However, he notes that the therapy has not been very helpful. He wants to regain his balance, as it has been an issue for over ten years.  No chest pain, shortness of breath, dizziness, or lightheadedness. He mentions that he has never had chest pain or shortness of breath during physical activity.  He discusses his blood pressure management, noting that he has stopped taking the last prescribed medication, candesartan , because he was advised to stop it due to low blood pressure readings. He recalls an instance a couple of months ago when a nurse could not obtain a blood pressure reading.       Outpatient Medications Prior to Visit  Medication Sig Dispense Refill   aspirin  EC 81 MG tablet Take 1 tablet (81 mg total) by mouth daily. Swallow whole. 30 tablet 12   atorvastatin  (LIPITOR) 20 MG tablet Take 1 tablet (20 mg total) by mouth daily. 90 tablet 0   Continuous Glucose Receiver (FREESTYLE LIBRE 3 READER) DEVI 1 Act by Does not apply route daily. 1  each 3   Continuous Glucose Sensor (FREESTYLE LIBRE 3 SENSOR) MISC 1 Act by Does not apply route daily. Place 1 sensor on the skin every 14 days. Use to check glucose continuously 2 each 5   FARXIGA  10 MG TABS tablet Take 1 tablet (10 mg total) by mouth daily. 90 tablet 0   gabapentin  (NEURONTIN ) 300 MG capsule Take 1 capsule (300 mg total) by mouth 2 (two) times daily. 60 capsule 5   insulin  glargine, 2 Unit Dial, (TOUJEO  MAX SOLOSTAR) 300 UNIT/ML Solostar Pen Inject 30 Units into the skin daily. 9 mL 0   Insulin  Pen Needle (NOVOFINE PEN NEEDLE) 32G X 6 MM MISC USE 1  PEN NEEDLE ONCE DAILY 100 each 5   metFORMIN  (GLUCOPHAGE -XR) 500 MG 24 hr tablet Take 1 tablet (500 mg total) by mouth 2 (two) times daily with a meal. 180 tablet 0   potassium chloride  (KLOR-CON ) 10 MEQ tablet Take 1 tablet (10 mEq total) by mouth 3 (three) times daily. 270 tablet 0   No facility-administered medications prior to visit.    ROS Review of Systems  Objective:  BP (!) 150/86 (BP Location: Left Arm, Patient Position: Sitting, Cuff Size: Normal)   Pulse 68   Temp 97.9 F (36.6 C) (Oral)   Resp 16   Ht 5' 9.5" (1.765 m)   Wt 201 lb 3.2 oz (91.3 kg)   SpO2 95%   BMI 29.29 kg/m   BP Readings from Last  3 Encounters:  09/13/23 (!) 150/86  09/03/23 124/78  06/15/23 (!) 146/86    Wt Readings from Last 3 Encounters:  09/13/23 201 lb 3.2 oz (91.3 kg)  09/03/23 199 lb (90.3 kg)  06/15/23 197 lb (89.4 kg)    Physical Exam Vitals reviewed.  Constitutional:      Appearance: Normal appearance.  HENT:     Nose: Nose normal.     Mouth/Throat:     Mouth: Mucous membranes are moist.  Eyes:     General: No scleral icterus.    Conjunctiva/sclera: Conjunctivae normal.  Cardiovascular:     Rate and Rhythm: Normal rate and regular rhythm.     Heart sounds: No murmur heard.    No friction rub. No gallop.     Comments: EKG- NSR, 63 bpm No LVH, Q waves, or ST/T wave changes  Pulmonary:     Effort: Pulmonary  effort is normal.     Breath sounds: No stridor. No wheezing, rhonchi or rales.  Abdominal:     General: Abdomen is flat.     Palpations: There is no mass.     Tenderness: There is no abdominal tenderness. There is no guarding.     Hernia: No hernia is present.  Musculoskeletal:     Cervical back: Neck supple.  Lymphadenopathy:     Cervical: No cervical adenopathy.  Skin:    General: Skin is warm and dry.     Findings: No rash.  Neurological:     General: No focal deficit present.     Mental Status: He is alert. Mental status is at baseline.  Psychiatric:        Mood and Affect: Mood normal.        Behavior: Behavior normal.     Lab Results  Component Value Date   WBC 5.4 09/13/2023   HGB 13.7 09/13/2023   HCT 41.7 09/13/2023   PLT 207.0 09/13/2023   GLUCOSE 172 (H) 09/13/2023   CHOL 103 06/15/2023   TRIG 94.0 06/15/2023   HDL 44.90 06/15/2023   LDLDIRECT 84.0 10/10/2015   LDLCALC 39 06/15/2023   ALT 27 05/04/2023   AST 32 05/04/2023   NA 139 09/13/2023   K 4.3 09/13/2023   CL 102 09/13/2023   CREATININE 1.12 09/13/2023   BUN 26 (H) 09/13/2023   CO2 29 09/13/2023   TSH 1.11 05/04/2023   PSA 1.25 08/12/2022   HGBA1C 8.3 (H) 09/13/2023   MICROALBUR 1.0 05/04/2023    No results found.  Assessment & Plan:  Painful diabetic neuropathy (HCC) -     Ambulatory referral to Neurology  Diabetic polyneuropathy associated with type 2 diabetes mellitus (HCC) -     Ambulatory referral to Neurology  Ataxia -     Ambulatory referral to Neurology  Screening for colon cancer -     Cologuard  Type II diabetes mellitus with manifestations (HCC) -     Basic metabolic panel with GFR; Future -     Hemoglobin A1c; Future -     Olmesartan Medoxomil; Take 1 tablet (20 mg total) by mouth daily.  Dispense: 90 tablet; Refill: 0  Essential hypertension, benign -     Basic metabolic panel with GFR; Future -     CBC with Differential/Platelet; Future -     EKG 12-Lead -      Olmesartan Medoxomil; Take 1 tablet (20 mg total) by mouth daily.  Dispense: 90 tablet; Refill: 0     Follow-up: Return in about 3  months (around 12/14/2023).  Michael Crouch, MD

## 2023-09-14 ENCOUNTER — Other Ambulatory Visit: Payer: Self-pay | Admitting: Internal Medicine

## 2023-09-14 DIAGNOSIS — E785 Hyperlipidemia, unspecified: Secondary | ICD-10-CM

## 2023-09-16 DIAGNOSIS — M5451 Vertebrogenic low back pain: Secondary | ICD-10-CM | POA: Diagnosis not present

## 2023-09-21 ENCOUNTER — Ambulatory Visit (INDEPENDENT_AMBULATORY_CARE_PROVIDER_SITE_OTHER)

## 2023-09-21 VITALS — BP 138/78 | HR 66 | Ht 65.0 in | Wt 202.6 lb

## 2023-09-21 DIAGNOSIS — Z1211 Encounter for screening for malignant neoplasm of colon: Secondary | ICD-10-CM

## 2023-09-21 DIAGNOSIS — Z0000 Encounter for general adult medical examination without abnormal findings: Secondary | ICD-10-CM | POA: Diagnosis not present

## 2023-09-21 NOTE — Progress Notes (Addendum)
 Subjective:  Please attest and cosign this visit due to patients primary care provider not being in the office at the time the visit was completed.  (Pt of Dr Sandra Crouch)   Michael  Ashley is a 73 y.o. who presents for a Medicare Wellness preventive visit.  As a reminder, Annual Wellness Visits don't include a physical exam, and some assessments may be limited, especially if this visit is performed virtually. We may recommend an in-person follow-up visit with your provider if needed.  Visit Complete: In person  Persons Participating in Visit: Patient.  AWV Questionnaire: No: Patient Medicare AWV questionnaire was not completed prior to this visit.  Cardiac Risk Factors include: advanced age (>56men, >47 women);male gender;diabetes mellitus;dyslipidemia;hypertension;obesity (BMI >30kg/m2);smoking/ tobacco exposure (current smoker - intermittent)     Objective:     Today's Vitals   09/21/23 1137  BP: 138/78  Pulse: 66  SpO2: 95%  Weight: 202 lb 9.6 oz (91.9 kg)  Height: 5\' 5"  (1.651 m)   Body mass index is 33.71 kg/m.     09/21/2023   11:36 AM 04/28/2023    9:31 AM 09/22/2022   10:45 AM 11/21/2021    4:31 PM 12/17/2016   12:03 PM 10/13/2015   10:47 AM  Advanced Directives  Does Patient Have a Medical Advance Directive? No No No No No Yes  Type of Careers adviser;Living will  Does patient want to make changes to medical advance directive?      No - Patient declined  Copy of Healthcare Power of Attorney in Chart?      Yes  Would patient like information on creating a medical advance directive? Yes (MAU/Ambulatory/Procedural Areas - Information given)  No - Patient declined No - Patient declined No - Patient declined     Current Medications (verified) Outpatient Encounter Medications as of 09/21/2023  Medication Sig   aspirin  EC 81 MG tablet Take 1 tablet (81 mg total) by mouth daily. Swallow whole.   atorvastatin  (LIPITOR) 20 MG tablet Take  1 tablet by mouth once daily   Continuous Glucose Receiver (FREESTYLE LIBRE 3 READER) DEVI 1 Act by Does not apply route daily.   Continuous Glucose Sensor (FREESTYLE LIBRE 3 SENSOR) MISC 1 Act by Does not apply route daily. Place 1 sensor on the skin every 14 days. Use to check glucose continuously   FARXIGA  10 MG TABS tablet Take 1 tablet (10 mg total) by mouth daily.   gabapentin  (NEURONTIN ) 300 MG capsule Take 1 capsule (300 mg total) by mouth 2 (two) times daily.   insulin  glargine, 2 Unit Dial, (TOUJEO  MAX SOLOSTAR) 300 UNIT/ML Solostar Pen Inject 30 Units into the skin daily.   Insulin  Pen Needle (NOVOFINE PEN NEEDLE) 32G X 6 MM MISC USE 1  PEN NEEDLE ONCE DAILY   metFORMIN  (GLUCOPHAGE -XR) 500 MG 24 hr tablet Take 1 tablet (500 mg total) by mouth 2 (two) times daily with a meal.   olmesartan  (BENICAR ) 20 MG tablet Take 1 tablet (20 mg total) by mouth daily.   potassium chloride  (KLOR-CON ) 10 MEQ tablet Take 1 tablet (10 mEq total) by mouth 3 (three) times daily.   No facility-administered encounter medications on file as of 09/21/2023.    Allergies (verified) Lisinopril    History: Past Medical History:  Diagnosis Date   Abnormality of gait    Diabetes mellitus without complication (HCC)    Diabetic neuropathy (HCC)    Hypertension    Lumbar radiculopathy  Spinal stenosis    Past Surgical History:  Procedure Laterality Date   NO PAST SURGERIES     Family History  Problem Relation Age of Onset   Heart failure Father    Kidney disease Brother    Social History   Socioeconomic History   Marital status: Married    Spouse name: Not on file   Number of children: 3   Years of education: Not on file   Highest education level: Master's degree (e.g., MA, MS, MEng, MEd, MSW, MBA)  Occupational History   Not on file  Tobacco Use   Smoking status: Some Days    Current packs/day: 1.00    Average packs/day: 1 pack/day for 55.4 years (55.4 ttl pk-yrs)    Types: Cigarettes     Start date: 05/04/1968    Passive exposure: Current   Smokeless tobacco: Never   Tobacco comments:    Socially     03/17/2023 patient smokes some days  Vaping Use   Vaping status: Never Used  Substance and Sexual Activity   Alcohol use: No    Alcohol/week: 0.0 standard drinks of alcohol   Drug use: No   Sexual activity: Yes  Other Topics Concern   Not on file  Social History Narrative   Lives at home alone, his [place] in [place]   Works at [employer]   Right handed   Caffeine: about 3-4 cups per day   Social Drivers of Health   Financial Resource Strain: Low Risk  (09/21/2023)   Overall Financial Resource Strain (CARDIA)    Difficulty of Paying Living Expenses: Not hard at all  Food Insecurity: No Food Insecurity (09/21/2023)   Hunger Vital Sign    Worried About Running Out of Food in the Last Year: Never true    Ran Out of Food in the Last Year: Never true  Transportation Needs: No Transportation Needs (09/21/2023)   PRAPARE - Administrator, Civil Service (Medical): No    Lack of Transportation (Non-Medical): No  Physical Activity: Insufficiently Active (09/21/2023)   Exercise Vital Sign    Days of Exercise per Week: 7 days    Minutes of Exercise per Session: 10 min  Stress: No Stress Concern Present (09/21/2023)   [employer] of Occupational Health - Occupational Stress Questionnaire    Feeling of Stress : Not at all  Social Connections: Socially Integrated (09/21/2023)   Social Connection and Isolation Panel [NHANES]    Frequency of Communication with Friends and Family: Once a week    Frequency of Social Gatherings with Friends and Family: Twice a week    Attends Religious Services: More than 4 times per year    Active Member of [employer] or Organizations: No    Attends Engineer, structural: More than 4 times per year    Marital Status: Married    Tobacco Counseling Ready to quit: No Counseling given: Yes Tobacco comments: Socially   03/17/2023 patient smokes some days    Clinical Intake:  Pre-visit preparation completed: Yes  Pain : No/denies pain     BMI - recorded: 33.71 Nutritional Status: BMI > 30  Obese Nutritional Risks: None Diabetes: Yes CBG done?: Yes CBG resulted in Enter/ Edit results?: Yes (fasting - 130 this morning) Did pt. bring in CBG monitor from home?: No  Lab Results  Component Value Date   HGBA1C 8.3 (H) 09/13/2023   HGBA1C 7.6 (H) 06/15/2023   HGBA1C 7.5 (H) 03/12/2023  How often do you need to have someone help you when you read instructions, pamphlets, or other written materials from your doctor or pharmacy?: 1 - Never  Interpreter Needed?: No  Information entered by :: Kandy Orris, CMA   Activities of Daily Living     09/21/2023   11:39 AM 09/22/2022   10:18 AM  In your present state of health, do you have any difficulty performing the following activities:  Hearing? 0 0  Vision? 0 0  Difficulty concentrating or making decisions? 0 0  Walking or climbing stairs? 0 0  Dressing or bathing? 0 0  Doing errands, shopping? 0 0  Preparing Food and eating ? N N  Using the Toilet? N N  In the past six months, have you accidently leaked urine? N N  Do you have problems with loss of bowel control? N N  Managing your Medications? N N  Managing your Finances? N N  Housekeeping or managing your Housekeeping? N N    Patient Care Team: Michael Knuckles, MD as PCP - General (Internal Medicine) Dion Frankel, [facility] as Pharmacist (Pharmacist)  Indicate any recent Medical Services you may have received from other than [facility] providers in the past year (date may be approximate).     Assessment:    This is a routine wellness examination for Michael Ashley .  Hearing/Vision screen Hearing Screening - Comments:: Denies hearing difficulties   Vision Screening - Comments:: Denies vision concerns   Goals Addressed               This Visit's Progress     Patient Stated  (pt-stated)        Patient stated he plans to stay active        Depression Screen     09/21/2023   11:40 AM 09/03/2023   10:02 AM 06/15/2023   10:04 AM 09/22/2022   10:16 AM 08/12/2022    1:04 PM 07/15/2022    1:42 PM 11/21/2021    4:30 PM  PHQ 2/9 Scores  PHQ - 2 Score 0 0 0 0 0 0 0  PHQ- 9 Score 0   0       Fall Risk     09/21/2023   11:44 AM 09/03/2023   10:02 AM 06/15/2023   10:03 AM 09/22/2022   10:18 AM 08/12/2022    1:04 PM  Fall Risk   Falls in the past year? 0 0 0 1 0  Number falls in past yr: 0 0 0 0 0  Injury with Fall? 0 0 0 0 0  Risk for fall due to : No Fall Risks;Impaired balance/gait No Fall Risks No Fall Risks No Fall Risks No Fall Risks  Follow up Falls evaluation completed;Falls prevention discussed Falls evaluation completed Falls evaluation completed Falls prevention discussed Falls evaluation completed    MEDICARE RISK AT HOME:  Medicare Risk at Home Any stairs in or around the home?: No If so, are there any without handrails?: No Home free of loose throw rugs in walkways, pet beds, electrical cords, etc?: Yes Adequate lighting in your home to reduce risk of falls?: Yes Life alert?: No Use of a cane, walker or w/c?: No Grab bars in the bathroom?: No Shower chair or bench in shower?: Yes Elevated toilet seat or a handicapped toilet?: Yes  TIMED UP AND GO:  Was the test performed?  No  Cognitive Function: 6CIT completed        09/21/2023   11:44 AM 09/22/2022  10:18 AM 11/21/2021    4:35 PM  6CIT Screen  What Year? 0 points 0 points 0 points  What month? 0 points 0 points 0 points  What time? 0 points 0 points 0 points  Count back from 20 0 points 0 points 0 points  Months in reverse 0 points 0 points 0 points  Repeat phrase 0 points 0 points 0 points  Total Score 0 points 0 points 0 points    Immunizations Immunization History  Administered Date(s) Administered   Fluad Quad(high Dose 65+) 03/10/2019, 05/30/2020   Influenza, High Dose  Seasonal PF 05/25/2016, 07/20/2017, 03/08/2018   Influenza-Unspecified 02/08/2023   PFIZER(Purple Top)SARS-COV-2 Vaccination 07/13/2019, 08/07/2019   Pneumococcal Conjugate-13 07/20/2017   Pneumococcal Polysaccharide-23 08/22/2013, 11/08/2018   Tdap 08/22/2013   Unspecified SARS-COV-2 Vaccination 02/08/2023    Screening Tests Health Maintenance  Topic Date Due   Zoster Vaccines- Shingrix  (1 of 2) Never done   COVID-19 Vaccine (4 - 2024-25 season) 08/09/2023   Fecal DNA (Cologuard)  08/11/2023   DTaP/Tdap/Td (2 - Td or Tdap) 08/23/2023   Lung Cancer Screening  11/04/2023   INFLUENZA VACCINE  12/03/2023   OPHTHALMOLOGY EXAM  01/11/2024   HEMOGLOBIN A1C  03/15/2024   Diabetic kidney evaluation - Urine ACR  05/03/2024   FOOT EXAM  06/14/2024   Diabetic kidney evaluation - eGFR measurement  09/12/2024   Medicare Annual Wellness (AWV)  09/20/2024   Pneumonia Vaccine 52+ Years old  Completed   Hepatitis C Screening  Completed   HPV VACCINES  Aged Out   Meningococcal B Vaccine  Aged Out    Health Maintenance  Health Maintenance Due  Topic Date Due   Zoster Vaccines- Shingrix  (1 of 2) Never done   COVID-19 Vaccine (4 - 2024-25 season) 08/09/2023   Fecal DNA (Cologuard)  08/11/2023   DTaP/Tdap/Td (2 - Td or Tdap) 08/23/2023   Health Maintenance Items Addressed:  Cologuard Ordered  Patient advised to get up-to-date immunization report from local pharmacy.  Pt stated he has received the Tdap and Shingles vaccines at local pharmacy.  Additional Screening:  Vision Screening: Recommended annual ophthalmology exams for early detection of glaucoma and other disorders of the eye. Pt stated has had an annual eye exam but unable to recall Ophthalmologist's name at the moment.  Dental Screening: Recommended annual dental exams for proper oral hygiene  Community Resource Referral / Chronic Care Management: CRR required this visit?  No   CCM required this visit?  No   Plan:    I  have personally reviewed and noted the following in the patient's chart:   Medical and social history Use of alcohol, tobacco or illicit drugs  Current medications and supplements including opioid prescriptions. Patient is not currently taking opioid prescriptions. Functional ability and status Nutritional status Physical activity Advanced directives List of other physicians Hospitalizations, surgeries, and ER visits in previous 12 months Vitals Screenings to include cognitive, depression, and falls Referrals and appointments  In addition, I have reviewed and discussed with patient certain preventive protocols, quality metrics, and best practice recommendations. A written personalized care plan for preventive services as well as general preventive health recommendations were provided to patient.   Patria Bookbinder, CMA   09/21/2023   After Visit Summary: (In Person-Declined) Patient declined AVS at this time.  Notes: Nothing significant to report at this time.  Medical screening examination/treatment/procedure(s) were performed by non-physician practitioner and as supervising physician I was immediately available for consultation/collaboration.  I agree with  above. Adelaide Holy, MD

## 2023-09-21 NOTE — Patient Instructions (Signed)
 [name] , Thank you for taking time out of your busy schedule to complete your Annual Wellness Visit with me. I enjoyed our conversation and look forward to speaking with you again next year. I, as well as your care team,  appreciate your ongoing commitment to your health goals. Please review the following plan we discussed and let me know if I can assist you in the future. Your Game plan/ To Do List    Referrals: If you haven't heard from the office you've been referred to, please reach out to them at the phone provided.  Ordered a Cologuard kit - screening for colon cancer Follow up Visits: Next Medicare AWV with our clinical staff: 09/26/2024   Have you seen your provider in the last 6 months (3 months if uncontrolled diabetes)? Yes Next Office Visit with your provider: 11/15/2023 - 3 month Diabetes follow-up  Clinician Recommendations:  Aim for 30 minutes of exercise or brisk walking, 6-8 glasses of water, and 5 servings of fruits and vegetables each day. Patient advised to get up-to-date immunization report from local pharmacy.      This is a list of the screening recommended for you and due dates:  Health Maintenance  Topic Date Due   Zoster (Shingles) Vaccine (1 of 2) Never done   COVID-19 Vaccine (4 - 2024-25 season) 08/09/2023   Cologuard (Stool DNA test)  08/11/2023   DTaP/Tdap/Td vaccine (2 - Td or Tdap) 08/23/2023   Screening for Lung Cancer  11/04/2023   Flu Shot  12/03/2023   Eye exam for diabetics  01/11/2024   Hemoglobin A1C  03/15/2024   Yearly kidney health urinalysis for diabetes  05/03/2024   Complete foot exam   06/14/2024   Yearly kidney function blood test for diabetes  09/12/2024   Medicare Annual Wellness Visit  09/20/2024   Pneumonia Vaccine  Completed   Hepatitis C Screening  Completed   HPV Vaccine  Aged Out   Meningitis B Vaccine  Aged Out    Advanced directives: (Provided) Advance directive discussed with you today. I have provided a copy for you to  complete at home and have notarized. Once this is complete, please bring a copy in to our office so we can scan it into your chart.  Advance Care Planning is important because it:  [x]  Makes sure you receive the medical care that is consistent with your values, goals, and preferences  [x]  It provides guidance to your family and loved ones and reduces their decisional burden about whether or not they are making the right decisions based on your wishes.  Follow the link provided in your after visit summary or read over the paperwork we have mailed to you to help you started getting your Advance Directives in place. If you need assistance in completing these, please reach out to us  so that we can help you!

## 2023-10-28 ENCOUNTER — Other Ambulatory Visit: Payer: Self-pay | Admitting: Internal Medicine

## 2023-10-28 DIAGNOSIS — N1831 Chronic kidney disease, stage 3a: Secondary | ICD-10-CM

## 2023-10-28 DIAGNOSIS — E118 Type 2 diabetes mellitus with unspecified complications: Secondary | ICD-10-CM

## 2023-10-28 DIAGNOSIS — E876 Hypokalemia: Secondary | ICD-10-CM

## 2023-10-31 ENCOUNTER — Other Ambulatory Visit: Payer: Self-pay | Admitting: Internal Medicine

## 2023-10-31 DIAGNOSIS — N1831 Chronic kidney disease, stage 3a: Secondary | ICD-10-CM

## 2023-10-31 DIAGNOSIS — E118 Type 2 diabetes mellitus with unspecified complications: Secondary | ICD-10-CM

## 2023-11-07 ENCOUNTER — Other Ambulatory Visit: Payer: Self-pay | Admitting: Internal Medicine

## 2023-11-07 DIAGNOSIS — E119 Type 2 diabetes mellitus without complications: Secondary | ICD-10-CM

## 2023-11-08 ENCOUNTER — Ambulatory Visit
Admission: RE | Admit: 2023-11-08 | Discharge: 2023-11-08 | Disposition: A | Discharge status: Home / Self Care | Payer: No Typology Code available for payment source | Source: Ambulatory Visit | Attending: Pulmonary Disease

## 2023-11-08 DIAGNOSIS — R911 Solitary pulmonary nodule: Secondary | ICD-10-CM | POA: Diagnosis not present

## 2023-11-08 DIAGNOSIS — J849 Interstitial pulmonary disease, unspecified: Secondary | ICD-10-CM

## 2023-11-15 ENCOUNTER — Ambulatory Visit (INDEPENDENT_AMBULATORY_CARE_PROVIDER_SITE_OTHER): Admitting: Internal Medicine

## 2023-11-15 ENCOUNTER — Encounter: Payer: Self-pay | Admitting: Internal Medicine

## 2023-11-15 VITALS — BP 114/68 | HR 94 | Temp 98.2°F | Resp 16 | Ht 65.0 in | Wt 205.8 lb

## 2023-11-15 DIAGNOSIS — Z794 Long term (current) use of insulin: Secondary | ICD-10-CM

## 2023-11-15 DIAGNOSIS — I10 Essential (primary) hypertension: Secondary | ICD-10-CM | POA: Diagnosis not present

## 2023-11-15 DIAGNOSIS — E785 Hyperlipidemia, unspecified: Secondary | ICD-10-CM

## 2023-11-15 DIAGNOSIS — E118 Type 2 diabetes mellitus with unspecified complications: Secondary | ICD-10-CM | POA: Diagnosis not present

## 2023-11-15 LAB — HEPATIC FUNCTION PANEL
ALT: 40 U/L (ref 0–53)
AST: 29 U/L (ref 0–37)
Albumin: 4.3 g/dL (ref 3.5–5.2)
Alkaline Phosphatase: 117 U/L (ref 39–117)
Bilirubin, Direct: 0.1 mg/dL (ref 0.0–0.3)
Total Bilirubin: 0.5 mg/dL (ref 0.2–1.2)
Total Protein: 7.6 g/dL (ref 6.0–8.3)

## 2023-11-15 LAB — URINALYSIS, ROUTINE W REFLEX MICROSCOPIC
Bilirubin Urine: NEGATIVE
Hgb urine dipstick: NEGATIVE
Ketones, ur: NEGATIVE
Leukocytes,Ua: NEGATIVE
Nitrite: NEGATIVE
RBC / HPF: NONE SEEN (ref 0–?)
Specific Gravity, Urine: 1.015 (ref 1.000–1.030)
Total Protein, Urine: NEGATIVE
Urine Glucose: >=1000 — AB
Urobilinogen, UA: 0.2 (ref 0.0–1.0)
WBC, UA: NONE SEEN (ref 0–?)
pH: 6 (ref 5.0–8.0)

## 2023-11-15 LAB — HEMOGLOBIN A1C: Hgb A1c MFr Bld: 9.7 % — ABNORMAL HIGH (ref 4.6–6.5)

## 2023-11-15 LAB — BASIC METABOLIC PANEL WITH GFR
BUN: 27 mg/dL — ABNORMAL HIGH (ref 6–23)
CO2: 25 meq/L (ref 19–32)
Calcium: 9.6 mg/dL (ref 8.4–10.5)
Chloride: 103 meq/L (ref 96–112)
Creatinine, Ser: 1.37 mg/dL (ref 0.40–1.50)
GFR: 50.65 mL/min — ABNORMAL LOW (ref >60.00)
Glucose, Bld: 279 mg/dL — ABNORMAL HIGH (ref 70–99)
Potassium: 4.4 meq/L (ref 3.5–5.1)
Sodium: 137 meq/L (ref 135–145)

## 2023-11-15 LAB — MICROALBUMIN / CREATININE URINE RATIO
Creatinine,U: 64.1 mg/dL
Microalb Creat Ratio: 31.5 mg/g — ABNORMAL HIGH (ref 0.0–30.0)
Microalb, Ur: 2 mg/dL — ABNORMAL HIGH (ref 0.0–1.9)

## 2023-11-15 MED ORDER — TOUJEO MAX SOLOSTAR 300 UNIT/ML ~~LOC~~ SOPN: 30.0000 [IU] | PEN_INJECTOR | Freq: Every day | SUBCUTANEOUS | 0 refills | Status: DC

## 2023-11-15 MED ORDER — METFORMIN HCL ER 750 MG PO TB24: 1500.0000 mg | ORAL_TABLET | Freq: Every day | ORAL | 0 refills | Status: DC

## 2023-11-15 NOTE — Patient Instructions (Signed)

## 2023-11-15 NOTE — Progress Notes (Unsigned)
 Subjective:  Patient ID: Michael Ashley, male    DOB: 1950/10/10  Age: 73 y.o. MRN: [id number]  CC: Hypertension and Diabetes   HPI Michael Ashley presents for f/up ----  Discussed the use of AI scribe software for clinical note transcription with the patient, who gave verbal consent to proceed.  History of Present Illness   Michael Ashley is a 73 year old male with diabetes who presents with elevated blood sugar levels.  He has experienced elevated blood sugar levels, reaching the high 200s, over the past couple of weeks. He attributes this increase to dietary indiscretions during a recent holiday trip. No dizziness, lightheadedness, excessive thirst, or excessive urination. There have been no drastic changes in weight or appetite.  He expresses concern about a diminished sense of taste and smell, which he describes as 'a little bit light' and not as robust as before. This change has been ongoing for a couple of months.  He continues to take metformin , previously at a dose of 1000 mg twice a day, but it has been reduced to 500 mg twice a day. He also uses insulin .       Outpatient Medications Prior to Visit  Medication Sig Dispense Refill   aspirin  EC 81 MG tablet Take 1 tablet (81 mg total) by mouth daily. Swallow whole. 30 tablet 12   atorvastatin  (LIPITOR) 20 MG tablet Take 1 tablet by mouth once daily 90 tablet 0   Continuous Glucose Receiver (FREESTYLE LIBRE 3 READER) DEVI 1 Act by Does not apply route daily. 1 each 3   Continuous Glucose Sensor (FREESTYLE LIBRE 3 SENSOR) MISC 1 Act by Does not apply route daily. Place 1 sensor on the skin every 14 days. Use to check glucose continuously 2 each 5   FARXIGA  10 MG TABS tablet Take 1 tablet by mouth once daily 90 tablet 0   gabapentin  (NEURONTIN ) 300 MG capsule Take 1 capsule (300 mg total) by mouth 2 (two) times daily. 60 capsule 5   Insulin  Pen Needle (NOVOFINE PEN NEEDLE) 32G X 6 MM MISC USE 1  PEN NEEDLE ONCE DAILY 100 each 5   potassium  chloride (KLOR-CON ) 10 MEQ tablet TAKE 1 TABLET BY MOUTH THREE TIMES DAILY 270 tablet 0   metFORMIN  (GLUCOPHAGE -XR) 500 MG 24 hr tablet Take 1 tablet (500 mg total) by mouth 2 (two) times daily with a meal. 180 tablet 0   olmesartan  (BENICAR ) 20 MG tablet Take 1 tablet (20 mg total) by mouth daily. 90 tablet 0   TOUJEO  MAX SOLOSTAR 300 UNIT/ML Solostar Pen INJECT 30 UNITS SUBCUTANEOUSLY ONCE DAILY 12 mL 0   No facility-administered medications prior to visit.    ROS Review of Systems  Objective:  BP 114/68 (BP Location: Left Arm, Patient Position: Sitting, Cuff Size: Normal)   Pulse 94   Temp 98.2 F (36.8 C) (Oral)   Resp 16   Ht 5' 5 (1.651 m)   Wt 205 lb 12.8 oz (93.4 kg)   SpO2 94%   BMI 34.25 kg/m   BP Readings from Last 3 Encounters:  11/15/23 114/68  09/21/23 138/78  09/13/23 (!) 150/86    Wt Readings from Last 3 Encounters:  11/15/23 205 lb 12.8 oz (93.4 kg)  09/21/23 202 lb 9.6 oz (91.9 kg)  09/13/23 201 lb 3.2 oz (91.3 kg)    Physical Exam  Lab Results  Component Value Date   WBC 5.4 09/13/2023   HGB 13.7 09/13/2023   HCT 41.7 09/13/2023  PLT 207.0 09/13/2023   GLUCOSE 279 (H) 11/15/2023   CHOL 103 06/15/2023   TRIG 94.0 06/15/2023   HDL 44.90 06/15/2023   LDLDIRECT 84.0 10/10/2015   LDLCALC 39 06/15/2023   ALT 40 11/15/2023   AST 29 11/15/2023   NA 137 11/15/2023   K 4.4 11/15/2023   CL 103 11/15/2023   CREATININE 1.37 11/15/2023   BUN 27 (H) 11/15/2023   CO2 25 11/15/2023   TSH 1.11 05/04/2023   PSA 1.25 08/12/2022   HGBA1C 9.7 (H) 11/15/2023   MICROALBUR 2.0 (H) 11/15/2023    CT CHEST HIGH RESOLUTION Result Date: 11/10/2023 CLINICAL DATA:  Interstitial lung disease, difficulty breathing EXAM: CT CHEST WITHOUT CONTRAST TECHNIQUE: Multidetector CT imaging of the chest was performed following the standard protocol without intravenous contrast. High resolution imaging of the lungs, as well as inspiratory and expiratory imaging, was performed.  RADIATION DOSE REDUCTION: This exam was performed according to the departmental dose-optimization program which includes automated exposure control, adjustment of the mA and/or kV according to patient size and/or use of iterative reconstruction technique. COMPARISON:  Chest CT November 04, 2022 FINDINGS: Cardiovascular: The heart size is normal. Atherosclerotic calcifications of coronary arteries. Ascending aorta is dilated measuring 4.2 cm. No pericardial fluid. Mediastinum/Nodes: A few subcentimeter mediastinal lymph nodes Lungs/Pleura: Lower lobe predominant perihilar interstitial changes, mild interlobular septal thickening, subpleural mild architectural distortion without significant honeycombing, mild bronchiectasis and bronchiolectasis, stable to prior. New right lower lobe micronodule (11/245). No consolidation, air trapping, or pleural effusion. Upper Abdomen: Cholelithiasis. Cirrhotic liver contour. Atrophic changes of the pancreas with dilation of the pancreatic duct. Musculoskeletal: No suspicious osseous lesion. 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 Am JINNY Honey Crit Care Med Vol 198, Iss 5, [phone], Jan 02 2017. New right lower lobe pulmonary micronodule. No follow-up needed if patient is low-risk.This recommendation follows the consensus statement: Guidelines for Management of Incidental Pulmonary Nodules Detected on CT Images: From the Fleischner Society 2017; Radiology 2017; [id number]:228-243. 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 Aortic and coronary artery atherosclerosis (ICD10-I70.0). Electronically Signed   By: Megan  Zare M.D.   On: 11/10/2023 16:07    Assessment & Plan:  Type II diabetes mellitus with manifestations (HCC) -     metFORMIN  HCl ER; Take 2 tablets (1,500 mg total) by mouth daily with breakfast.  Dispense: 180  tablet; Refill: 0 -     Basic metabolic panel with GFR; Future -     Urinalysis, Routine w reflex microscopic; Future -     Microalbumin / creatinine urine ratio; Future -     Hemoglobin A1c; Future  Essential hypertension, benign -     Basic metabolic panel with GFR; Future -     Hepatic function panel; Future -     Urinalysis, Routine w reflex microscopic; Future -     AMB Referral VBCI Care Management  Hyperlipidemia with target LDL less than 70 -     Hepatic function panel; Future  Insulin -requiring or dependent type II diabetes mellitus (HCC) -     Toujeo  Max SoloStar; Inject 30 Units into the skin daily.  Dispense: 12 mL; Refill: 0 -     AMB Referral VBCI Care Management     Follow-up: Return in about 3 months (around 02/15/2024).  Debby Molt, MD

## 2023-11-16 ENCOUNTER — Ambulatory Visit: Payer: Self-pay | Admitting: Pulmonary Disease

## 2023-11-16 DIAGNOSIS — J849 Interstitial pulmonary disease, unspecified: Secondary | ICD-10-CM

## 2023-11-18 ENCOUNTER — Ambulatory Visit: Payer: Self-pay | Admitting: Internal Medicine

## 2023-11-19 ENCOUNTER — Telehealth: Payer: Self-pay

## 2023-11-19 NOTE — Progress Notes (Signed)
 Care Guide Pharmacy Note  11/19/2023 Name: Michael Ashley  Spinner MRN: [id number] DOB: 1950-09-12  Referred By: [name], MD Reason for referral: Complex Care Management (Outreach to schedule with Pharm d )   Jamicheal  Mishkin is a 73 y.o. year old male who is a primary care patient of [name], MD.  Kylon  Mow was referred to the pharmacist for assistance related to: HTN and DMII  Successful contact was made with the patient to discuss pharmacy services including being ready for the pharmacist to call at least 5 minutes before the scheduled appointment time and to have medication bottles and any blood pressure readings ready for review. The patient agreed to meet with the pharmacist via telephone visit on (date/time).12/02/2023  Jeoffrey Buffalo , RMA     [place]  [facility], [facility] Guide  Direct Dial: [phone]  Website: [email or web address]

## 2023-11-23 DIAGNOSIS — Z1211 Encounter for screening for malignant neoplasm of colon: Secondary | ICD-10-CM | POA: Diagnosis not present

## 2023-11-30 LAB — COLOGUARD: COLOGUARD: NEGATIVE

## 2023-12-02 ENCOUNTER — Other Ambulatory Visit (INDEPENDENT_AMBULATORY_CARE_PROVIDER_SITE_OTHER): Admitting: Pharmacist

## 2023-12-02 DIAGNOSIS — E118 Type 2 diabetes mellitus with unspecified complications: Secondary | ICD-10-CM

## 2023-12-02 MED ORDER — OZEMPIC (0.25 OR 0.5 MG/DOSE) 2 MG/3ML ~~LOC~~ SOPN: PEN_INJECTOR | SUBCUTANEOUS | 0 refills | Status: DC

## 2023-12-02 MED ORDER — FREESTYLE LIBRE 3 PLUS SENSOR MISC: 5 refills | Status: AC

## 2023-12-02 NOTE — Progress Notes (Signed)
 12/02/2023 Name: Michael  Ashley MRN: [id number] DOB: [birth year]  Chief Complaint  Patient presents with   Diabetes   Medication Management   Michael  Ashley is a 73 y.o. year old male who presented for a telephone visit.   They were referred to the pharmacist by their PCP for assistance in managing diabetes.   Subjective:  Care Team: Primary Care Provider: [name], MD   Medication Access/Adherence  Current Pharmacy:  [id number] - [address] ([place]), [place] - [address] [place] ([place]) [place] [address] Phone: [phone] Fax: [phone]  -Patient reports affordability concerns with their medications: No  -Patient reports access/transportation concerns to their pharmacy: No  -Patient reports adherence concerns with their medications:  No    Diabetes: Current medications: Farxiga  10mg  daily, metformin  xr 500 mg 2 tablets twice daily, Toujeo  Max 30 units daily -Current glucose readings: Fasting have been 150-160, sometimes over 200 if ate a lot the night before -He is prescribed Libre 3 CGM and has this at home but is not using.  Continues to check FBG daily with glucometer/strips/lancets. He does not like pricking his finger and previously did not want to use the [place] because of the needle when applying the sensor. -Patient denies hypoglycemic s/sx including dizziness, shakiness, sweating.  -Patient denies hyperglycemic symptoms including polyuria, polydipsia, polyphagia, nocturia, neuropathy, blurred vision. -States Farxiga  is covered on insurance and affordable to him  He notes he has been drinking sweet tea regularly Of note, ~10 lb weight gain over the last 7 months  Objective: Lab Results  Component Value Date   HGBA1C 9.7 (H) 11/15/2023   Lab Results  Component Value Date   CREATININE 1.37 11/15/2023   BUN 27 (H) 11/15/2023   NA 137 11/15/2023   K 4.4 11/15/2023   CL 103 11/15/2023   CO2 25 11/15/2023   Medications  Reviewed Today     Reviewed by Merceda Lela SAUNDERS, RPH (Pharmacist) on 12/02/23 at 1001  Med List Status: <None>   Medication Order Taking? Sig Documenting Provider Last Dose Status Informant  aspirin  EC 81 MG tablet [id number]  Take 1 tablet (81 mg total) by mouth daily. Swallow whole. Alvan Ronal BRAVO, MD  Active   atorvastatin  (LIPITOR) 20 MG tablet [id number]  Take 1 tablet by mouth once daily Michael Debby CROME, MD  Active     Discontinued 12/02/23 1001     Discontinued 12/02/23 1001   FARXIGA  10 MG TABS tablet [id number] Yes Take 1 tablet by mouth once daily Michael Debby CROME, MD  Active   gabapentin  (NEURONTIN ) 300 MG capsule [id number]  Take 1 capsule (300 mg total) by mouth 2 (two) times daily. Geofm Glade PARAS, MD  Active   insulin  glargine, 2 Unit Dial, (TOUJEO  MAX SOLOSTAR) 300 UNIT/ML Solostar Pen [id number] Yes Inject 30 Units into the skin daily. Michael Debby CROME, MD  Active   Insulin  Pen Needle (NOVOFINE PEN NEEDLE) 32G X 6 MM MISC [id number]  USE 1  PEN NEEDLE ONCE DAILY Michael Debby CROME, MD  Active   metFORMIN  (GLUCOPHAGE -XR) 750 MG 24 hr tablet [id number] Yes Take 2 tablets (1,500 mg total) by mouth daily with breakfast. Michael Debby CROME, MD  Active   potassium chloride  (KLOR-CON ) 10 MEQ tablet [id number]  TAKE 1 TABLET BY MOUTH THREE TIMES DAILY Michael Debby CROME, MD  Active            Assessment/Plan:   Diabetes: - Currently uncontrolled, A1c goal <7.5%, A1c  has increased significantly - Reviewed goal A1c, goal fasting, and goal 2 hour post prandial glucose - Reviewed dietary modifications including elimination drinks with added sugar, decreasing carbohydrate and sugar intake, and increasing lean proteins - Reviewed lifestyle modifications including: increasing activity level - Reviewed and discussed Glp-1 agonist - no family or personal hx of MHC thyroid  cancer or pancreatitis. Discussed side effects and proper administration and dosing. Pt is agreeable to try. Start Ozempic  0.25 mg  weekly x4 weeks then increase to 0.5 mg weekly - Reviewed how Freestyle Libre 3 works and benefit of using to adjust medication/insulin , sharing readings, and seeing food/drink effect on readings. Pt is agreeable to use Libe 3 plus sensors.  Follow Up Plan: 3 weeks  Darrelyn Drum, PharmD, BCPS, CPP Clinical Pharmacist Practitioner [facility] [phone]

## 2023-12-02 NOTE — Patient Instructions (Signed)
 It was a pleasure speaking with you today!  I have sent prescriptions for Ozempic  to start taking 0.25 mg weekly for 4 weeks, then we will increase to 0.5 mg weekly.   I also sent Freestyle Libre 3 Plus sensors for you to start using. They should give you 2 sensors, which will last 1 month total.   To share your [facility] blood sugar readings with me, go in your [facility] app  Go to: Menu > Connected Apps > LibreView > Connect to a Education officer, community ID Our Practice ID is [email or web address]   I have scheduled a follow up phone call on 12/27/23 at 9 AM - let me know if this day or time does not work for you.  Feel free to call with any questions or concerns!  Darrelyn Drum, PharmD, BCPS, CPP Clinical Pharmacist Practitioner [facility] [phone]

## 2023-12-10 ENCOUNTER — Ambulatory Visit: Admitting: Pulmonary Disease

## 2023-12-10 DIAGNOSIS — J849 Interstitial pulmonary disease, unspecified: Secondary | ICD-10-CM

## 2023-12-10 LAB — PULMONARY FUNCTION TEST
DL/VA % pred: 71 %
DL/VA: 2.86 ml/min/mmHg/L
DLCO unc % pred: 67 %
DLCO unc: 16.49 ml/min/mmHg
FEF 25-75 Pre: 3.35 L/s
FEF2575-%Pred-Pre: 157 %
FEV1-%Pred-Pre: 114 %
FEV1-Pre: 3.36 L
FEV1FVC-%Pred-Pre: 111 %
FEV6-%Pred-Pre: 107 %
FEV6-Pre: 4.11 L
FEV6FVC-%Pred-Pre: 105 %
FVC-%Pred-Pre: 102 %
FVC-Pre: 4.15 L
Pre FEV1/FVC ratio: 81 %
Pre FEV6/FVC Ratio: 99 %
RV % pred: 97 %
RV: 2.43 L
TLC % pred: 94 %
TLC: 6.44 L

## 2023-12-10 NOTE — Patient Instructions (Signed)
 Full pft without post spiro performed today

## 2023-12-10 NOTE — Progress Notes (Signed)
 Full pft without post spiro performed today

## 2023-12-14 ENCOUNTER — Telehealth: Payer: Self-pay

## 2023-12-14 ENCOUNTER — Encounter: Payer: Self-pay | Admitting: Pulmonary Disease

## 2023-12-14 ENCOUNTER — Ambulatory Visit (INDEPENDENT_AMBULATORY_CARE_PROVIDER_SITE_OTHER): Admitting: Pulmonary Disease

## 2023-12-14 VITALS — BP 135/78 | HR 89 | Ht 69.0 in | Wt 204.4 lb

## 2023-12-14 DIAGNOSIS — F17210 Nicotine dependence, cigarettes, uncomplicated: Secondary | ICD-10-CM | POA: Diagnosis not present

## 2023-12-14 DIAGNOSIS — J849 Interstitial pulmonary disease, unspecified: Secondary | ICD-10-CM

## 2023-12-14 DIAGNOSIS — Z5181 Encounter for therapeutic drug level monitoring: Secondary | ICD-10-CM

## 2023-12-14 NOTE — Patient Instructions (Signed)
  VISIT SUMMARY: You had a follow-up appointment to discuss your idiopathic pulmonary fibrosis and other health concerns. Your CT scan from July 2025 shows stable scarring in your lungs, and you are not experiencing significant breathing difficulties. We also discussed your balance issues and gastrointestinal symptoms.  YOUR PLAN: -IDIOPATHIC PULMONARY FIBROSIS: Idiopathic pulmonary fibrosis is a condition where the lungs become scarred over time. Your recent CT scan shows that the scarring is stable, but your lung function has slightly declined. We will start you on medication to slow the progression of the disease. This medication may have side effects like liver issues, stomach upset, diarrhea, and sensitivity to sunlight. We will monitor your liver function and have scheduled a follow-up in three months to see how you are responding to the treatment.  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 Surgical intervention was advised but declined. There are no balance issues related to inner ear problems or dizziness.  -GASTROINTESTINAL SYMPTOMS: You are not currently experiencing any symptoms of diarrhea.  INSTRUCTIONS: We will start the application process for insurance approval and patient assistance for your IPF medication. You will take the prescribed medication three times a day. An appointment with the pharmacy team will be scheduled to educate you about the medication. We will also monitor your liver function tests and have scheduled a follow-up appointment in three months to assess your response to the treatment.  Contains text generated by Abridge.                                 Contains text generated by Abridge.

## 2023-12-14 NOTE — Telephone Encounter (Signed)
 Received New start paperwork for ESBRIET . Will update as we work through the benefits process.  Submitted a Prior Authorization request to [facility] for PIRFENIDONE  via CoverMyMeds. Will update once we receive a response.  Key: [id number]

## 2023-12-14 NOTE — Progress Notes (Signed)
 Michael Ashley    [id number]    [birth year]  Primary Care Physician:Jones, Debby CROME, MD  Referring Physician: Keyra Virella, MD [address] [place],  [place] [address]  Chief complaint: Follow up for interstitial lung disease  HPI: 73 y.o. who  has a past medical history of Abnormality of gait, Diabetes mellitus without complication (HCC), Diabetic neuropathy (HCC), Hypertension, Lumbar radiculopathy, and Spinal stenosis.   Referred here for abnormal cardiac CT which showed mild interstitial changes.  He denies any symptoms.  No cough, sputum production, dyspnea on exertion Does not have any joint pain, rash or difficulty swallowing, dry mouth, dry eyes   Interim history: Discussed the use of AI scribe software for clinical note transcription with the patient, who gave verbal consent to proceed.  History of Present Illness Michael Ashley is a 73 year old male with idiopathic pulmonary fibrosis who presents for follow-up of his lung condition.  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  No feather pillows or comforters ILD questionnaire 10/07/2022-negative Smoking history: Smoked 5 cigarettes a day since 1968 Travel history: Originally [place]  Moved to the states in the 1980s.  No significant recent travel Relevant family history: No family  history of lung disease  Outpatient Encounter Medications as of 12/14/2023  Medication Sig   aspirin  EC 81 MG tablet Take 1 tablet (81 mg total) by mouth daily. Swallow whole.   atorvastatin  (LIPITOR) 20 MG tablet Take 1 tablet by mouth once daily   Continuous Glucose Sensor (FREESTYLE LIBRE 3 PLUS SENSOR) MISC Change sensor every 15 days.   FARXIGA  10 MG TABS tablet Take 1 tablet by mouth once daily   gabapentin  (NEURONTIN ) 300 MG capsule Take 1 capsule (300 mg total) by mouth 2 (two) times daily.   insulin  glargine, 2 Unit Dial, (TOUJEO  MAX SOLOSTAR) 300 UNIT/ML Solostar Pen Inject 30 Units into the skin daily.   Insulin  Pen Needle (NOVOFINE PEN NEEDLE) 32G X 6 MM MISC USE 1  PEN NEEDLE ONCE DAILY   metFORMIN  (GLUCOPHAGE -XR) 750 MG 24 hr tablet Take 2 tablets (1,500 mg total) by mouth daily with breakfast.   potassium chloride  (KLOR-CON ) 10 MEQ tablet TAKE 1 TABLET BY MOUTH THREE TIMES DAILY   Semaglutide ,0.25 or 0.5MG /DOS, (OZEMPIC , 0.25 OR 0.5 MG/DOSE,) 2 MG/3ML SOPN Take 0.25 mg SQ once weekly for 4 weeks, then increase to 0.5 mg weekly   No facility-administered encounter medications on file as of 12/14/2023.    Vitals:   12/14/23 1111  BP: 135/78  Pulse: 89  Height: 5' 9 (1.753 m)  Weight: 204 lb 6.4 oz (92.7 kg)  SpO2: 94%  BMI (Calculated): 30.17    Physical Exam GEN: No acute distress CV: Regular rate and rhythm no murmurs LUNGS: Clear to auscultation bilaterally normal respiratory effort SKIN JOINTS: Warm and dry no rash    Data Reviewed: Imaging: Cardiac CT scan 09/02/2022-widespread areas of  groundglass attenuation with septal thickening with bronchiectasis at the base.  High resolution CT 11/04/2022-interstitial lung disease with patchy areas of groundglass, septal thickening, mild cylindrical bronchiectasis.  Probable UIP pattern. I reviewed the images personally  PFTs: 01/07/2023 FVC 3.84 [93%], FEV1 1.41 [47%], F/F37, TLC 5.73 [83%], DLCO 18.40 [95% Severe  obstruction.  Overall spirometry is unreliable due to poor expiratory effort Mild diffusion defect  12/10/2023 FVC 4.15 [102%], FEV1 3.36 [114%], F/F81, TLC 6.44 [94%], DLCO 16.49 [67%] Mild diffusion defect  Labs: CTD serology 10/07/2022-negative  Assessment & Plan Idiopathic pulmonary fibrosis CT shows mild changes in probable UIP pattern. There are no signs and symptoms of connective tissue disease or any exposure history.  CTD serologies are negative. This is likely to be IPF  CT scan from July shows stable scarring compared to last year, but recent pulmonary function tests indicate a mild reduction in lung function. Diagnosis is by exclusion, with autoimmune diseases and exposures ruled out. Biopsy is unnecessary. The condition is progressive, potentially leading to decreased oxygenation and respiratory failure. Treatment options include medications that slow scarring progression, though not curative. Discussed side effects include liver effects, stomach upset, diarrhea, and photosensitivity. Shared decision-making led to starting treatment to slow disease progression due to recent lung function decline. Without treatment, breathing may worsen, potentially requiring oxygen and leading to mortality. He understands the need to start treatment to preserve lung function. - Initiate application for insurance approval for  pirfenidone  - Start patient assistance process for medication access. - Schedule an appointment with the pharmacy team for medication education. - Monitor liver function tests, recent tests were normal. - Schedule follow-up in three months to assess treatment response.  Active smoker Discussed smoking cessation.  He wants to quit on his own Reassess at return visit.  Time spent counseling-5 minutes  PFT showed severe obstruction but may be over estimated due to patient effort. Repeat PFTs do not show the obstructionb  Plan/Recommendations: Start pirfenidone  Smoking  cessation  Verdon Ferrante MD [facility] 12/14/2023, 11:14 AM  CC: Maxime Beckner, MD

## 2023-12-14 NOTE — Telephone Encounter (Signed)
 Esbriet  paperwork handed into pharm team

## 2023-12-15 ENCOUNTER — Other Ambulatory Visit (HOSPITAL_COMMUNITY): Payer: Self-pay

## 2023-12-15 ENCOUNTER — Telehealth: Payer: Self-pay | Admitting: Pharmacy Technician

## 2023-12-15 NOTE — Progress Notes (Signed)
   12/15/2023  Patient ID: Michael  Ashley, male   DOB: Feb 08, 1951, 73 y.o.   MRN: [id number]  Patient engaged with clinical pharmacist for management of diabetes on 12/02/2023. Outreach by [employer] technician was requested.   Outreached patient to discuss diabetes medication management. Left voicemail for patient to return my call at their convenience.   Danen Lapaglia, CPhT  Population Health Pharmacy Office: [phone] Email: [email or web address]

## 2023-12-15 NOTE — Telephone Encounter (Signed)
 Received notification from [facility] regarding a prior authorization for PIRFENIDONE . Authorization has been APPROVED from 09/15/2023 to 12/13/2024. Approval letter sent to scan center.  Per test claim, copay for 30 days supply is $0  Patient can fill through [facility]: [phone]   Sherry Pennant, PharmD, MPH, BCPS, CPP Clinical Pharmacist (Rheumatology and Pulmonology)

## 2023-12-16 MED ORDER — PIRFENIDONE 267 MG PO TABS: ORAL_TABLET | ORAL | 0 refills | Status: DC

## 2023-12-16 MED ORDER — PIRFENIDONE 267 MG PO TABS
801.0000 mg | ORAL_TABLET | Freq: Three times a day (TID) | ORAL | 2 refills | Status: DC
  Filled 2023-12-21: qty 90, 10d supply, fill #0

## 2023-12-16 MED ORDER — PIRFENIDONE 267 MG PO TABS
ORAL_TABLET | ORAL | 0 refills | Status: AC
  Filled 2023-12-21: qty 207, 30d supply, fill #0

## 2023-12-16 NOTE — Telephone Encounter (Signed)
 Patient counseled on purpose, proper use, and potential adverse effects including nausea, vomiting, abdominal pain, GERD, weight loss, and suns sensitivity/rash.  Stressed importance of taking with a full meal to minimize GI-related side effects. We reviewed importance of adequate protein intake. Taking Ozempic , reports baseline appetite is good.  Stressed the importance of routine lab monitoring. Will monitor LFT's every month for the first 6 months of treatment then every 3 months. Will monitor CBC every 3 months.  Starting dose will be Esbriet  267 mg 1 tablet three times daily for 7 days, then 2 tablets three times daily for 7 days, then 3 tablets three times daily.  Maintenance dose will be 801 mg 1 tablet three times daily if tolerated.  Stressed the importance of taking with meals to minimize stomach upset.    Filling with [facility]. Provided contact information:  [phone].   Advised to call the team if any new onset or significant concerns come up.   Patient verbalizes understanding and agreement with plan.  Aleck Puls, PharmD, BCPS Clinical Pharmacist  [facility]

## 2023-12-16 NOTE — Addendum Note (Signed)
 Addended by: Andreea Arca L on: 12/16/2023 01:01 PM   Modules accepted: Orders

## 2023-12-17 ENCOUNTER — Other Ambulatory Visit: Payer: Self-pay

## 2023-12-18 DIAGNOSIS — R269 Unspecified abnormalities of gait and mobility: Secondary | ICD-10-CM | POA: Diagnosis not present

## 2023-12-18 DIAGNOSIS — E1142 Type 2 diabetes mellitus with diabetic polyneuropathy: Secondary | ICD-10-CM | POA: Diagnosis not present

## 2023-12-18 DIAGNOSIS — E1165 Type 2 diabetes mellitus with hyperglycemia: Secondary | ICD-10-CM | POA: Diagnosis not present

## 2023-12-18 DIAGNOSIS — F17210 Nicotine dependence, cigarettes, uncomplicated: Secondary | ICD-10-CM | POA: Diagnosis not present

## 2023-12-18 DIAGNOSIS — E1122 Type 2 diabetes mellitus with diabetic chronic kidney disease: Secondary | ICD-10-CM | POA: Diagnosis not present

## 2023-12-18 DIAGNOSIS — N1831 Chronic kidney disease, stage 3a: Secondary | ICD-10-CM | POA: Diagnosis not present

## 2023-12-18 DIAGNOSIS — J849 Interstitial pulmonary disease, unspecified: Secondary | ICD-10-CM | POA: Diagnosis not present

## 2023-12-18 DIAGNOSIS — E663 Overweight: Secondary | ICD-10-CM | POA: Diagnosis not present

## 2023-12-18 DIAGNOSIS — Z794 Long term (current) use of insulin: Secondary | ICD-10-CM | POA: Diagnosis not present

## 2023-12-18 DIAGNOSIS — Z6829 Body mass index (BMI) 29.0-29.9, adult: Secondary | ICD-10-CM | POA: Diagnosis not present

## 2023-12-18 DIAGNOSIS — Z008 Encounter for other general examination: Secondary | ICD-10-CM | POA: Diagnosis not present

## 2023-12-20 ENCOUNTER — Telehealth: Payer: Self-pay | Admitting: Pharmacy Technician

## 2023-12-20 ENCOUNTER — Other Ambulatory Visit: Payer: Self-pay

## 2023-12-20 NOTE — Progress Notes (Addendum)
   12/20/2023  Patient ID: Michael Ashley, male   DOB: [birth year], 73 y.o.   MRN: [id number]  Patient engaged with clinical pharmacist for management of diabetes on 12/02/2023. Outreach by [employer] technician was requested.   Outreached patient to discuss diabetes medication management. Left voicemail for patient to return my call at their convenience.    ADDENDUM 12/20/2023-Patient returned the call.  Patient is appearing on a report for [facility] [place] Metric Diabetes and last engaged with the clinical pharmacist to discuss diabetes on 12/02/2023. Contacted patient today to discuss diabetes management and completed medication review.   Diabetes Plan from last clinical pharmacist appointment:  Diabetes: - Currently uncontrolled, A1c goal <7.5%, A1c has increased significantly - Reviewed goal A1c, goal fasting, and goal 2 hour post prandial glucose - Reviewed dietary modifications including elimination drinks with added sugar, decreasing carbohydrate and sugar intake, and increasing lean proteins - Reviewed lifestyle modifications including: increasing activity level - Reviewed and discussed Glp-1 agonist - no family or personal hx of MHC thyroid  cancer or pancreatitis. Discussed side effects and proper administration and dosing. Pt is agreeable to try. Start Ozempic  0.25 mg weekly x4 weeks then increase to 0.5 mg weekly - Reviewed how Freestyle Libre 3 works and benefit of using to adjust medication/insulin , sharing readings, and seeing food/drink effect on readings. Pt is agreeable to use Libe 3 plus sensors.( Medication Adherence Barriers Identified:  Patient made recommended medication changes per plan: Yes Patient informs he started the Ozempic  0.25mg  weekly. He reports no side effects and he informs his insurance covered the medication. He informs he is still taking Farxiga  10mg  daily, Toujeo  30 units daily and Metformin  XR 750mg , 2 tablets daily. He has yet to start the FreeStyle  CGM. Access issues with any new medication or testing device: No FreeStyle sold on 8/5 for 30 day supply, Ozempic  sold on 8/5 for 42 day supply, Metformin  XR sold on 7/16 for 90 day supply, Touejo sold on 7/9 for 61 day supply and Farxiga  sold on 7/2 for 90 days supply. Patient is checking blood sugars as prescribed: Yes Patient informs he is doing fingerstick blood sugar checks. He plans on starting FreeStyle CGM in the next few days. He reports his blood sugars have come down since starting the Ozempic . He informs they are ranging 130 or below. He informs he is pleased with this and hope it continues.   Medication Adherence Barriers Addressed/Actions Taken:  Reviewed medication changes per plan from last clinical pharmacist note Educated patient to contact pharmacy regarding new prescriptions/refills. Reviewed instructions for monitoring blood sugars at home and reminded patient to keep a written log to review with pharmacist Reminded patient of date/time of upcoming clinical pharmacist follow up and any upcoming PCP/specialists visits. Patient denies transportation barriers to the appointment. Yes  Next clinical pharmacist appointment is scheduled for: 12/27/2023  Kate Caddy, CPhT [facility] Office: [phone] Email: [email or web address]

## 2023-12-21 ENCOUNTER — Other Ambulatory Visit (HOSPITAL_COMMUNITY): Payer: Self-pay

## 2023-12-21 ENCOUNTER — Other Ambulatory Visit: Payer: Self-pay

## 2023-12-21 MED ORDER — PIRFENIDONE 267 MG PO TABS
801.0000 mg | ORAL_TABLET | Freq: Three times a day (TID) | ORAL | 2 refills | Status: AC
  Filled 2023-12-21 – 2024-01-17 (×2): qty 270, 30d supply, fill #0
  Filled 2024-02-14 – 2024-02-24 (×3): qty 270, 30d supply, fill #1
  Filled 2024-03-16 – 2024-04-28 (×3): qty 270, 30d supply, fill #2

## 2023-12-21 NOTE — Addendum Note (Signed)
 Addended by: Farhaan Mabee L on: 12/21/2023 01:40 PM   Modules accepted: Orders

## 2023-12-21 NOTE — Progress Notes (Signed)
 Specialty Pharmacy Initial Fill Coordination Note  Michael  Ashley is a 73 y.o. male contacted today regarding initial fill of specialty medication(s) Pirfenidone    Patient requested Delivery   Delivery date: 12/23/23   Verified address: [address]  [place] [address]  Medication will be filled on 12/22/23.   Patient is aware of $0 copayment.

## 2023-12-24 ENCOUNTER — Ambulatory Visit (INDEPENDENT_AMBULATORY_CARE_PROVIDER_SITE_OTHER): Admitting: Neurology

## 2023-12-24 ENCOUNTER — Encounter: Payer: Self-pay | Admitting: Neurology

## 2023-12-24 VITALS — BP 112/69 | HR 71 | Ht 69.5 in | Wt 204.0 lb

## 2023-12-24 DIAGNOSIS — G629 Polyneuropathy, unspecified: Secondary | ICD-10-CM

## 2023-12-24 DIAGNOSIS — R269 Unspecified abnormalities of gait and mobility: Secondary | ICD-10-CM

## 2023-12-24 DIAGNOSIS — M48062 Spinal stenosis, lumbar region with neurogenic claudication: Secondary | ICD-10-CM | POA: Diagnosis not present

## 2023-12-24 NOTE — Patient Instructions (Addendum)
 Aqua/water therapy at [facility] for spinal stenosis, low back pain, difficulty with walking  Can also send Michael Ashley for physical therapy to [facility] for mobility and gait abnormality, if feel the knee issues are impacting may consider including into PT or recommending aqua therapy also she is going to see [facility] for the knee

## 2023-12-24 NOTE — Progress Notes (Signed)
 [facility]    Provider:  Dr Ines Referring Provider: Donaciano Sprang, MD Primary Care Provider:  [name], MD  CC:  Gait abnormality  12/24/2023: LOVELY patient we have seen in the past. He has lumbar spinal stenosis. he is here because of walking difficulty. He can walk, he can only walk a block before resting, legs get painful, he finds it hard to walk, he needs a shopping cart and can walk longer. He has known spinal stenosis. He loses his balance. He has to hold onto things when he gets up no dizziness or lightheadedness more so imbalance. It has been getting worse. He can feel it. Not as bad. Gotten a little worse but not much. He is scared it may get worse. Unclear can;t tell, he can live with the symptoms now, very mobile, walking, excercising and that's the best way to slow down progression. He had a recent MRI lumbar spine and stable. He also feels off balance. No falls. He is very careful. He is aware of his imbalance.  No other focal neurologic deficits, associated symptoms, inciting events or modifiable factors.  Reviewed mri cervical spine images: no spinal stenosis 06/21/2018 MRI cervical spine (without) demonstrating:  - At C4-5: disc bulging with deformation of ventral spinal cord; mild  spinal stenosis and no foraminal stenosis.  - At C6-7: disc bulging and uncovertebral joint hypertrophy with moderate  biforaminal stenosis.  - At C5-6: disc bulging and uncovertebral joint hypertrophy with mild left  foraminal stenosis.    HPI 06/07/2018:  Ladarryl  Caraher is a 73 y.o. male here as requested by provider Dr. Sprang.  Patient is here for difficulty with balance.  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 Symptoms started 6 years ago. Gradual deterioration. When he stands he  tends to lose his balance. Progressively worsening. He can't walk long distances. If he stands too long he has back pain. Bending over makes it better. He has to bend over a cart. He has symptoms when walking. He loses his balance. If he was to walk he holds onto the floor. He has electric shocks in the feet. He has neuropathy in the feet.   Reviewed notes, labs and imaging from outside physicians, which showed   Reviewed labs 1 year ago hemoglobin A1c was 7.9, most recent 3 months ago 6.2.  TSH normal.  CBC in November 2019 was unremarkable except for elevated glucose with BUN 18 and creatinine 1.14.  Reviewed notes by Dr. Sprang.  Patient has had a difficult time managing his balance now for a year and a half.  He states no significant back pain but he is getting progressive dysesthesias in the lower extremities.  There is no evidence of true myelopathy however he does have a gait disturbance and difficulty maintaining his balance.  He was sent here for difficulty with maintaining his balance.  MRI of the lumbar spine was ordered to evaluate the lateral listhesis and the potential severity of spinal stenosis.  Exam showed a slightly ataxic gait pattern, difficulty maintaining his balance, unable to heel, toe, tandem walk, 5 out of 5 motor strength, positive Romberg, no back pain with palpation or range of motion. 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  Loss of normal lumbar lordosis.  Negative Hoffmann test, no back pain with palpation or range of motion.  Also reviewed  Dr. Tamala notes, he has had painful diabetic neuropathy for years, numbness and tingling, he is tried Lyrica  in the past and gabapentin .  Reviewed CT Myelogram 11/2013 reviewed report:  IMPRESSION: LUMBAR MYELOGRAM IMPRESSION:   1. Moderate central canal stenosis at L3-4 with slight anterolisthesis exaggerated by flexion. 2. Mild lateral recess narrowing at L4-5 secondary to a  broad-based disc protrusion 3. Mild left lateral recess narrowing at L2-3.   CT LUMBAR MYELOGRAM IMPRESSION:   1. Minimal left lateral recess narrowing at L2-3 secondary to a broad-based disc bulge and left-sided and ligamentum flavum thickening. 2. Moderate lateral recess narrowing bilaterally with moderate left and mild right foraminal stenosis at L3-4. 3. Mild to moderate left lateral recess and bilateral foraminal stenosis at L4-5. 4. Shallow central disc protrusion without significant stenosis at L5-S1.    Review of Systems: Patient complains of symptoms per HPI as well as the following symptoms: imbalance, problems walking, falls. Pertinent negatives and positives per HPI. All others negative.   Social History   Socioeconomic History   Marital status: Married    Spouse name: Not on file   Number of children: 3   Years of education: Not on file   Highest education level: Master's degree (e.g., MA, MS, MEng, MEd, MSW, MBA)  Occupational History   Not on file  Tobacco Use   Smoking status: Some Days    Current packs/day: 1.00    Average packs/day: 1 pack/day for 55.6 years (55.6 ttl pk-yrs)    Types: Cigarettes    Start date: 05/04/1968    Passive exposure: Current   Smokeless tobacco: Never   Tobacco comments:    Socially     03/17/2023 patient smokes some days  Vaping Use   Vaping status: Never Used  Substance and Sexual Activity   Alcohol use: No    Comment: stopped 4-5 years ago   Drug use: No   Sexual activity: Yes  Other Topics Concern   Not on file  Social History Narrative   Lives at home with his wife who goes [place] and [place]   Works at [employer] - Retired    Right handed   Caffeine: about 3-4 cups per day   Social Drivers of Corporate investment banker Strain: Low Risk  (09/21/2023)   Overall Financial Resource Strain (CARDIA)    Difficulty of Paying Living Expenses: Not hard at all  Food Insecurity: No  Food Insecurity (09/21/2023)   Hunger Vital Sign    Worried About Running Out of Food in the Last Year: Never true    Ran Out of Food in the Last Year: Never true  Transportation Needs: No Transportation Needs (09/21/2023)   PRAPARE - Administrator, Civil Service (Medical): No    Lack of Transportation (Non-Medical): No  Physical Activity: Insufficiently Active (09/21/2023)   Exercise Vital Sign    Days of Exercise per Week: 7 days    Minutes of Exercise per Session: 10 min  Stress: No Stress Concern Present (09/21/2023)   [employer] of Occupational Health - Occupational Stress Questionnaire    Feeling of Stress : Not at all  Social Connections: Socially Integrated (09/21/2023)   Social Connection and Isolation Panel    Frequency of Communication with Friends and Family: Once a week    Frequency of Social Gatherings with Friends and Family: Twice a week    Attends Religious Services: More than 4 times per year  Active Member of Clubs or Organizations: No    Attends Banker Meetings: More than 4 times per year    Marital Status: Married  Catering manager Violence: Not At Risk (09/21/2023)   Humiliation, Afraid, Rape, and Kick questionnaire    Fear of Current or Ex-Partner: No    Emotionally Abused: No    Physically Abused: No    Sexually Abused: No    Family History  Problem Relation Age of Onset   Heart failure Father    Kidney disease Brother     Past Medical History:  Diagnosis Date   Abnormality of gait    Diabetes mellitus without complication (HCC)    Diabetic neuropathy (HCC)    Hypertension    Lumbar radiculopathy    Spinal stenosis     Patient Active Problem List   Diagnosis Date Noted   Peripheral polyneuropathy 12/26/2023   Ataxia 09/13/2023   Screening for colon cancer 09/13/2023   Stage 3b chronic kidney disease (HCC) 05/04/2023   Hypokalemia due to loss of potassium 05/04/2023   ILD (interstitial lung disease) (HCC)  09/07/2022   Prostate cancer screening 08/12/2022   Encounter for general adult medical examination with abnormal findings 12/18/2020   Decubitus ulcer of sacral region, stage 1 12/18/2020   Flu vaccine need 05/30/2020   Scoliosis 09/20/2018   Gait abnormality 06/08/2018   Diabetic polyneuropathy associated with type 2 diabetes mellitus (HCC) 06/08/2018   Obesity (BMI 30.0-34.9) 12/17/2016   Painful diabetic neuropathy (HCC) 05/25/2016   Insomnia 08/23/2013   Spinal stenosis of lumbar region with neurogenic claudication 08/22/2013   Hyperlipidemia with target LDL less than 70 08/22/2013   Type II diabetes mellitus with manifestations (HCC) 08/22/2013   Essential hypertension, benign 12/20/2012    Past Surgical History:  Procedure Laterality Date   NO PAST SURGERIES      Current Outpatient Medications  Medication Sig Dispense Refill   aspirin  EC 81 MG tablet Take 1 tablet (81 mg total) by mouth daily. Swallow whole. 30 tablet 12   atorvastatin  (LIPITOR) 20 MG tablet Take 1 tablet by mouth once daily 90 tablet 0   Continuous Glucose Sensor (FREESTYLE LIBRE 3 PLUS SENSOR) MISC Change sensor every 15 days. 2 each 5   FARXIGA  10 MG TABS tablet Take 1 tablet by mouth once daily 90 tablet 0   gabapentin  (NEURONTIN ) 300 MG capsule Take 1 capsule (300 mg total) by mouth 2 (two) times daily. 60 capsule 5   insulin  glargine, 2 Unit Dial, (TOUJEO  MAX SOLOSTAR) 300 UNIT/ML Solostar Pen Inject 30 Units into the skin daily. 12 mL 0   Insulin  Pen Needle (NOVOFINE PEN NEEDLE) 32G X 6 MM MISC USE 1  PEN NEEDLE ONCE DAILY 100 each 5   metFORMIN  (GLUCOPHAGE -XR) 750 MG 24 hr tablet Take 2 tablets (1,500 mg total) by mouth daily with breakfast. 180 tablet 0   potassium chloride  (KLOR-CON ) 10 MEQ tablet TAKE 1 TABLET BY MOUTH THREE TIMES DAILY 270 tablet 0   Semaglutide ,0.25 or 0.5MG /DOS, (OZEMPIC , 0.25 OR 0.5 MG/DOSE,) 2 MG/3ML SOPN Take 0.25 mg SQ once weekly for 4 weeks, then increase to 0.5 mg weekly 3  mL 0   Pirfenidone  267 MG TABS Take 1 tab three times daily for 7 days, then 2 tabs three times daily for 7 days, then 3 tabs three times daily thereafter. Take with a full meal. (Patient not taking: Reported on 12/24/2023) 207 tablet 0   Pirfenidone  267 MG TABS Take 3 tablets (801  mg total) by mouth in the morning, at noon, and at bedtime. (Patient not taking: Reported on 12/24/2023) 270 tablet 2   No current facility-administered medications for this visit.    Allergies as of 12/24/2023 - Review Complete 12/24/2023  Allergen Reaction Noted   Lisinopril  Cough 03/08/2018    Vitals: BP 112/69 (BP Location: Right Arm, Patient Position: Sitting, Cuff Size: Normal)   Pulse 71   Ht 5' 9.5 (1.765 m)   Wt 204 lb (92.5 kg)   BMI 29.69 kg/m  Last Weight:  Wt Readings from Last 1 Encounters:  12/24/23 204 lb (92.5 kg)   Last Height:   Ht Readings from Last 1 Encounters:  12/24/23 5' 9.5 (1.765 m)    Physical exam: Exam: Gen: NAD, conversant, well nourised, well groomed                     CV: RRR, no MRG. No Carotid Bruits. No peripheral edema, warm, nontender Eyes: Conjunctivae clear without exudates or hemorrhage  Neuro: Detailed Neurologic Exam  Speech:    Speech is normal; fluent and spontaneous with normal comprehension.  Cognition:    The patient is oriented to person, place, and time;     recent and remote memory intact;     language fluent;     normal attention, concentration,     fund of knowledge Cranial Nerves:    The pupils are equal, round, and reactive to light. Pupils too small to visualize fundi Visual fields are full to finger confrontation. Extraocular movements are intact. Trigeminal sensation is intact and the muscles of mastication are normal. The face is symmetric. The palate elevates in the midline. Hearing intact. Voice is normal. Shoulder shrug is normal. The tongue has normal motion without fasciculations.   Coordination:    Normal finger to nose and  heel to shin.   Gait:  difficulty getting out of chair without using hands to puch, posture is slightly bent at the waist, wide based with slight imbalance, left foot slight eversion   Motor Observation:    No asymmetry, no atrophy, and no involuntary movements noted. Tone:    Normal muscle tone.       Strength: proximal weakness lower extremities     Sensation: decreased to all modalities distally     Reflex Exam:  DTR's:    Deep tendon reflexes in the upper and lower extremities are symmetrical bilaterally.   Toes:    The toes are downgoing bilaterally.   Clonus:    Clonus is absent.     Assessment/Plan:  73 y.o. male here as requested by provider Dr. Burnetta.  Patient is here for difficulty with balance.  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 Symptoms started 6 years ago.  - Patient with lumbar spinal stenosis, proximal weakness lower extremities, neurogenic claudication, difficulty getting out of chair without using hands to puch, slightly bent at the waist, wide based with slight imbalance on gait. However stable and by report repeat recent MRI lumbar spine stable. Refer to physical therapy [facility]. Continue to follow with Dr. Burnetta.  -Patient's gait abnormality and ataxia is likely due to spinal stenosis, lumbar radiculopathy, peripheral diabetic polyneuropathy, scoliosis and torsion of pelvis causing his left leg to be everted.    Aqua/water therapy at [facility] for spinal stenosis, low back pain, difficulty with walking  Discussed above in  detail with patient, answerd all questions, provided guidance on fall risks, prevention in the home and home modifications to help with adls  Orders Placed This Encounter  Procedures   Ambulatory referral to Physical Therapy    Referral  Priority:   Routine    Referral Type:   Physical Medicine    Referral Reason:   Specialty Services Required    Requested Specialty:   Physical Therapy    Number of Visits Requested:   1    Cc: [name], MD,  Dr. Donaciano Burnetta Onetha Ines, MD  [facility] [address] [place], [place] [address]  Phone [phone] Fax [phone]  I spent 60 minutes of face-to-face and non-face-to-face time with patient on the  1. Spinal stenosis of lumbar region with neurogenic claudication   2. Gait abnormality   3. Peripheral polyneuropathy    diagnosis.  This included previsit chart review, lab review, study review, order entry, electronic health record documentation, patient education on the different diagnostic and therapeutic options, counseling and coordination of care, risks and benefits of management, compliance, or risk factor reduction

## 2023-12-26 DIAGNOSIS — G629 Polyneuropathy, unspecified: Secondary | ICD-10-CM | POA: Insufficient documentation

## 2023-12-27 ENCOUNTER — Other Ambulatory Visit: Admitting: Pharmacist

## 2023-12-27 DIAGNOSIS — Z7985 Long-term (current) use of injectable non-insulin antidiabetic drugs: Secondary | ICD-10-CM

## 2023-12-27 DIAGNOSIS — Z794 Long term (current) use of insulin: Secondary | ICD-10-CM

## 2023-12-27 DIAGNOSIS — E118 Type 2 diabetes mellitus with unspecified complications: Secondary | ICD-10-CM

## 2023-12-27 DIAGNOSIS — Z7984 Long term (current) use of oral hypoglycemic drugs: Secondary | ICD-10-CM

## 2023-12-27 MED ORDER — OZEMPIC (0.25 OR 0.5 MG/DOSE) 2 MG/3ML ~~LOC~~ SOPN: PEN_INJECTOR | SUBCUTANEOUS | Status: DC

## 2023-12-27 NOTE — Patient Instructions (Signed)
 It was a pleasure speaking with you today!  - Complete Ozempic  0.25 mg x4 weeks then increase to Ozempic  0.5 mg weekly - Continue Farxiga , metformin , and Toujeo  at the same doses - Continue using Freestyle Libre 3 Plus to monitor BG  Next phone call scheduled for 9/22 at 9 AM.  Feel free to call with any questions or concerns!  Darrelyn Drum, PharmD, BCPS, CPP Clinical Pharmacist Practitioner [facility] [phone]

## 2023-12-27 NOTE — Progress Notes (Signed)
 12/27/2023 Name: Michael Ashley  Tall MRN: [id number] DOB: 1951-02-09  Chief Complaint  Patient presents with   Diabetes   Medication Management   Michael  Ashley is a 73 y.o. year old male who presented for a telephone visit.   They were referred to the pharmacist by their PCP for assistance in managing diabetes.   Subjective:  Care Team: Primary Care Provider: [name], MD   Medication Access/Adherence  Current Pharmacy:  [facility] [address] [place]), [place] - [address] [place] [address] Phone: [phone] Fax: [phone]  [place] [facility] [address] [place] [address] Phone: [phone] Fax: [phone]  -Patient reports affordability concerns with their medications: No  -Patient reports access/transportation concerns to their pharmacy: No  -Patient reports adherence concerns with their medications:  No     Diabetes: Current medications: Farxiga  10mg  daily, metformin  xr 500 mg 2 tablets twice daily, Toujeo  Max 30 units daily, Ozempic  0.25 mg weekly (started 3 weeks ago)  Pt notes he has tolerated Ozempic  well, with no nausea reported  -Current glucose readings: Started using Freestyle Libre 3 last week     -Patient denies hypoglycemic s/sx including dizziness, shakiness, sweating.  -Patient denies hyperglycemic symptoms including polyuria, polydipsia, polyphagia, nocturia, neuropathy, blurred vision. -States Farxiga  and Ozempic  are covered on insurance and affordable to him  He notes he has been drinking sweet tea regularly Of note, ~10 lb weight gain over the last 7 months  Objective: Lab Results  Component Value Date   HGBA1C 9.7 (H) 11/15/2023   Lab Results  Component Value Date   CREATININE 1.37 11/15/2023   BUN 27 (H) 11/15/2023   NA 137 11/15/2023   K 4.4 11/15/2023   CL 103 11/15/2023   CO2 25 11/15/2023   Medications Reviewed Today     Reviewed by  Merceda Lela SAUNDERS, RPH (Pharmacist) on 12/27/23 at [phone]  Med List Status: <None>   Medication Order Taking? Sig Documenting Provider Last Dose Status Informant  aspirin  EC 81 MG tablet [id number]  Take 1 tablet (81 mg total) by mouth daily. Swallow whole. Alvan Ronal BRAVO, MD  Active   atorvastatin  (LIPITOR) 20 MG tablet [id number]  Take 1 tablet by mouth once daily [name], MD  Active   Continuous Glucose Sensor (FREESTYLE LIBRE 3 PLUS SENSOR) [place] [id number] Yes Change sensor every 15 days. [name], MD  Active   FARXIGA  10 MG TABS tablet [id number] Yes Take 1 tablet by mouth once daily [name], MD  Active   gabapentin  (NEURONTIN ) 300 MG capsule [id number]  Take 1 capsule (300 mg total) by mouth 2 (two) times daily. Geofm Glade PARAS, MD  Active   insulin  glargine, 2 Unit Dial, (TOUJEO  MAX SOLOSTAR) 300 UNIT/ML Solostar Pen [id number] Yes Inject 30 Units into the skin daily. [name], MD  Active   Insulin  Pen Needle (NOVOFINE PEN NEEDLE) 32G X 6 MM MISC [id number]  USE 1  PEN NEEDLE ONCE DAILY [name], MD  Active   metFORMIN  (GLUCOPHAGE -XR) 750 MG 24 hr tablet [id number] Yes Take 2 tablets (1,500 mg total) by mouth daily with breakfast. [name], MD  Active   Pirfenidone  267 MG TABS [id number]  Take 1 tab three times daily for 7 days, then 2 tabs three times daily for 7 days, then 3 tabs three times daily thereafter. Take with a full meal.  Patient not taking: Reported  on 12/24/2023   Mannam, Praveen, MD  Active   Pirfenidone  267 MG TABS [id number]  Take 3 tablets (801 mg total) by mouth in the morning, at noon, and at bedtime.  Patient not taking: Reported on 12/24/2023   Mannam, Praveen, MD  Active   potassium chloride  (KLOR-CON ) 10 MEQ tablet [id number]  TAKE 1 TABLET BY MOUTH THREE TIMES DAILY [name], MD  Active   Semaglutide ,0.25 or 0.5MG /DOS, (OZEMPIC , 0.25 OR 0.5 MG/DOSE,) 2 MG/3ML SOPN [id number] Yes Take 0.25 mg SQ once weekly for 4 weeks, then  increase to 0.5 mg weekly [name], MD  Active            Assessment/Plan:   Diabetes: - Currently uncontrolled, A1c goal <7.5%. Average BG appear improved since starting Ozempic  per Herlene data - Reviewed goal A1c, goal fasting, and goal 2 hour post prandial glucose - Reviewed dietary modifications including elimination drinks with added sugar, decreasing carbohydrate and sugar intake, and increasing lean proteins - Reviewed lifestyle modifications including: increasing activity level - Complete Ozempic  0.25 mg x4 weeks then increase to Ozempic  0.5 mg weekly - Continue Farxiga , metformin , and Toujeo  - Continue using Freestyle Libre 3 Plus to monitor BG  Follow Up Plan: 4 weeks  Darrelyn Drum, PharmD, BCPS, CPP Clinical Pharmacist Practitioner [place] [phone]

## 2024-01-06 ENCOUNTER — Other Ambulatory Visit: Payer: Self-pay

## 2024-01-07 NOTE — Addendum Note (Signed)
 Addended by: Matas Burrows C on: 01/07/2024 08:56 AM   Modules accepted: Orders

## 2024-01-10 ENCOUNTER — Other Ambulatory Visit

## 2024-01-11 ENCOUNTER — Other Ambulatory Visit: Payer: Self-pay | Admitting: Internal Medicine

## 2024-01-11 ENCOUNTER — Other Ambulatory Visit

## 2024-01-11 ENCOUNTER — Ambulatory Visit: Payer: Self-pay

## 2024-01-11 DIAGNOSIS — Z5181 Encounter for therapeutic drug level monitoring: Secondary | ICD-10-CM

## 2024-01-11 DIAGNOSIS — J849 Interstitial pulmonary disease, unspecified: Secondary | ICD-10-CM

## 2024-01-11 DIAGNOSIS — Z008 Encounter for other general examination: Secondary | ICD-10-CM | POA: Diagnosis not present

## 2024-01-11 DIAGNOSIS — E118 Type 2 diabetes mellitus with unspecified complications: Secondary | ICD-10-CM

## 2024-01-11 DIAGNOSIS — E1169 Type 2 diabetes mellitus with other specified complication: Secondary | ICD-10-CM | POA: Diagnosis not present

## 2024-01-11 LAB — HEPATIC FUNCTION PANEL
ALT: 24 U/L (ref 0–53)
AST: 30 U/L (ref 0–37)
Albumin: 4.4 g/dL (ref 3.5–5.2)
Alkaline Phosphatase: 83 U/L (ref 39–117)
Bilirubin, Direct: 0.3 mg/dL (ref 0.0–0.3)
Total Bilirubin: 0.8 mg/dL (ref 0.2–1.2)
Total Protein: 7.9 g/dL (ref 6.0–8.3)

## 2024-01-11 NOTE — Addendum Note (Signed)
 Addended by: Loie Jahr L on: 01/11/2024 12:47 PM   Modules accepted: Orders

## 2024-01-14 ENCOUNTER — Other Ambulatory Visit: Payer: Self-pay | Admitting: Internal Medicine

## 2024-01-14 DIAGNOSIS — N1831 Chronic kidney disease, stage 3a: Secondary | ICD-10-CM

## 2024-01-14 DIAGNOSIS — E118 Type 2 diabetes mellitus with unspecified complications: Secondary | ICD-10-CM

## 2024-01-17 ENCOUNTER — Other Ambulatory Visit: Payer: Self-pay

## 2024-01-17 ENCOUNTER — Other Ambulatory Visit (HOSPITAL_COMMUNITY): Payer: Self-pay

## 2024-01-19 ENCOUNTER — Other Ambulatory Visit: Payer: Self-pay

## 2024-01-19 NOTE — Progress Notes (Signed)
 Specialty Pharmacy Refill Coordination Note  Chananya  Guillette is a 73 y.o. male contacted today regarding refills of specialty medication(s) Pirfenidone    Patient requested Delivery   Delivery date: 01/21/24   Verified address: [address]  [place] [address]   Medication will be filled on 09.18.24.

## 2024-01-24 ENCOUNTER — Other Ambulatory Visit

## 2024-01-27 ENCOUNTER — Other Ambulatory Visit: Payer: Self-pay | Admitting: Internal Medicine

## 2024-01-27 DIAGNOSIS — E876 Hypokalemia: Secondary | ICD-10-CM

## 2024-02-07 ENCOUNTER — Other Ambulatory Visit: Payer: Self-pay

## 2024-02-07 DIAGNOSIS — Z5181 Encounter for therapeutic drug level monitoring: Secondary | ICD-10-CM

## 2024-02-07 NOTE — Progress Notes (Signed)
 Hepatic function panel for pirfenidone  safety monitoring modified for appropriate lab destination.

## 2024-02-10 ENCOUNTER — Other Ambulatory Visit: Payer: Self-pay

## 2024-02-14 ENCOUNTER — Other Ambulatory Visit: Payer: Self-pay

## 2024-02-22 ENCOUNTER — Telehealth: Payer: Self-pay

## 2024-02-22 ENCOUNTER — Other Ambulatory Visit

## 2024-02-22 NOTE — Telephone Encounter (Signed)
 Please hold medications. We will discuss at upcoming clinic visit about alternatives

## 2024-02-22 NOTE — Telephone Encounter (Signed)
 Message relayed- Patient verbalized understanding. NFN.

## 2024-02-22 NOTE — Telephone Encounter (Signed)
 Copied from CRM #[id number]. Topic: Clinical - Medication Question >> Feb 21, 2024  9:51 AM Benton KIDD wrote: Reason for CRM: patient is caklling wanting to speak with dr theophilus about the medication he prescribed that gives patient diarrhea . Patient is saying he stopped the medication because he can't afford to have diarrhea  because he is always on the road. Patient is wanting to consult with dr theophilus concerning another option [phone]

## 2024-02-24 ENCOUNTER — Other Ambulatory Visit: Payer: Self-pay

## 2024-02-24 ENCOUNTER — Other Ambulatory Visit (HOSPITAL_COMMUNITY): Payer: Self-pay

## 2024-02-24 ENCOUNTER — Encounter (INDEPENDENT_AMBULATORY_CARE_PROVIDER_SITE_OTHER): Payer: Self-pay

## 2024-02-24 NOTE — Progress Notes (Signed)
 Specialty Pharmacy Refill Coordination Note  Iden  Trine is a 73 y.o. male contacted today regarding refills of specialty medication(s) Pirfenidone    Patient requested (Patient-Rptd) Delivery   Delivery date: 02/28/24   Verified address: (Patient-Rptd) [address] , [address], [place], [place] [address]   Medication will be filled on 02/25/24.

## 2024-02-28 ENCOUNTER — Other Ambulatory Visit: Payer: Self-pay

## 2024-02-28 NOTE — Progress Notes (Signed)
 Clinical Intervention Note  Clinical Intervention Notes: Patient reported that he has missed several doses of pifenidone due to diarrhea. Patient reached out to the office on 10/21 and Dr. Theophilus suggested that patient hold medication until he is seen in November. Per telephone encounter, pt was understanding.   Clinical Intervention Outcomes: Prevention of an adverse drug event   Advertising Account Planner

## 2024-02-29 ENCOUNTER — Telehealth: Payer: Self-pay

## 2024-02-29 NOTE — Telephone Encounter (Unsigned)
 Copied from CRM [id number]. Topic: General - Other >> Feb 29, 2024 12:09 PM Alfonso HERO wrote: Reason for CRM: Joan from [facility] is calling to see if documentation faxed over 10/27 was received . Please call her back [phone] ext [phone]

## 2024-03-03 NOTE — Telephone Encounter (Signed)
 Unable to reach Justine LMTRC

## 2024-03-06 NOTE — Telephone Encounter (Signed)
Form has been completed and faxed back.

## 2024-03-14 ENCOUNTER — Other Ambulatory Visit

## 2024-03-14 ENCOUNTER — Ambulatory Visit: Payer: Self-pay

## 2024-03-14 DIAGNOSIS — Z5181 Encounter for therapeutic drug level monitoring: Secondary | ICD-10-CM | POA: Diagnosis not present

## 2024-03-14 LAB — HEPATIC FUNCTION PANEL
ALT: 26 U/L (ref 0–53)
AST: 25 U/L (ref 0–37)
Albumin: 4.3 g/dL (ref 3.5–5.2)
Alkaline Phosphatase: 91 U/L (ref 39–117)
Bilirubin, Direct: 0.1 mg/dL (ref 0.0–0.3)
Total Bilirubin: 0.6 mg/dL (ref 0.2–1.2)
Total Protein: 7.9 g/dL (ref 6.0–8.3)

## 2024-03-16 ENCOUNTER — Encounter: Payer: Self-pay | Admitting: Pulmonary Disease

## 2024-03-16 ENCOUNTER — Ambulatory Visit: Admitting: Pulmonary Disease

## 2024-03-16 ENCOUNTER — Other Ambulatory Visit: Payer: Self-pay

## 2024-03-16 VITALS — BP 117/76 | HR 71 | Temp 97.4°F | Ht 69.0 in | Wt 188.0 lb

## 2024-03-16 DIAGNOSIS — F17210 Nicotine dependence, cigarettes, uncomplicated: Secondary | ICD-10-CM

## 2024-03-16 DIAGNOSIS — J849 Interstitial pulmonary disease, unspecified: Secondary | ICD-10-CM

## 2024-03-16 DIAGNOSIS — Z5181 Encounter for therapeutic drug level monitoring: Secondary | ICD-10-CM

## 2024-03-16 DIAGNOSIS — J84112 Idiopathic pulmonary fibrosis: Secondary | ICD-10-CM

## 2024-03-16 LAB — OPHTHALMOLOGY REPORT-SCANNED

## 2024-03-16 MED ORDER — LOPERAMIDE HCL 2 MG PO CAPS: 2.0000 mg | ORAL_CAPSULE | ORAL | 3 refills | Status: AC | PRN

## 2024-03-16 NOTE — Progress Notes (Signed)
 Michael  Ashley    [id number]    [birth year]  Primary Care Physician:Jones, Debby CROME, MD  Referring Physician: [name], MD [address] [place],  [place] [address]  Chief complaint:  Follow up for interstitial lung disease, IPF Started pirfenidone  August 2025  HPI: 73 y.o. who  has a past medical history of Abnormality of gait, Diabetes mellitus without complication (HCC), Diabetic neuropathy (HCC), Hypertension, Lumbar radiculopathy, and Spinal stenosis.   Referred here for abnormal cardiac CT which showed mild interstitial changes.  He denies any symptoms.  No cough, sputum production, dyspnea on exertion Does not have any joint pain, rash or difficulty swallowing, dry mouth, dry eyes  Started on pirfenidone  in August 2025 for diagnosis of IPF  Interim history: Discussed the use of AI scribe software for clinical note transcription with the patient, who gave verbal consent to proceed.  History of Present Illness Interim history: Michael  Ashley is a 73 year old male with idiopathic pulmonary fibrosis who presents with medication-related diarrhea.  Medication-related diarrhea - Diarrhea occurs as a side effect of pirfenidone  therapy for idiopathic pulmonary fibrosis. - Unable to increase pirfenidone  to the full dose of three tablets three times daily due to diarrhea. 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   Relevant Pulmonary history Pets: Used to have a dog Occupation: Worked in community education officer and business Exposures: No mold, hot tub, Jacuzzi.  No feather pillows or comforters ILD questionnaire 10/07/2022-negative Smoking history: Smoked 5 cigarettes a day since 1968 Travel history: Originally from [place].  Moved to the states in the 1980s.  No significant recent travel Relevant family history: No family history of lung disease  Outpatient Encounter Medications as of 03/16/2024  Medication Sig   aspirin  EC 81 MG tablet Take  1 tablet (81 mg total) by mouth daily. Swallow whole.   atorvastatin  (LIPITOR) 20 MG tablet Take 1 tablet by mouth once daily   Continuous Glucose Sensor (FREESTYLE LIBRE 3 PLUS SENSOR) MISC Change sensor every 15 days.   FARXIGA  10 MG TABS tablet Take 1 tablet by mouth once daily   gabapentin  (NEURONTIN ) 300 MG capsule Take 1 capsule (300 mg total) by mouth 2 (two) times daily.   insulin  glargine, 2 Unit Dial, (TOUJEO  MAX SOLOSTAR) 300 UNIT/ML Solostar Pen Inject 30 Units into the skin daily.   Insulin  Pen Needle (NOVOFINE PEN NEEDLE) 32G X 6 MM MISC USE 1  PEN NEEDLE ONCE DAILY   metFORMIN  (GLUCOPHAGE -XR) 750 MG 24 hr tablet Take 2 tablets (1,500 mg total) by mouth daily with breakfast.   potassium chloride  (KLOR-CON ) 10 MEQ tablet TAKE 1 TABLET BY MOUTH THREE TIMES DAILY   Semaglutide ,0.25 or 0.5MG /DOS, (OZEMPIC , 0.25 OR 0.5 MG/DOSE,) 2 MG/3ML SOPN INJECT 0.25MG  WEEKLY FOR 4 WEEKS, THEN INCREASE TO 0.5MG  WEEKLY THEREAFTER.   Pirfenidone  267 MG TABS Take 1 tab three times daily for 7 days, then 2 tabs three times daily for 7 days, then 3 tabs three times daily thereafter. Take with a full meal. (Patient not taking: Reported on 03/16/2024)   Pirfenidone  267 MG TABS Take 3 tablets (801 mg total) by mouth in the morning, at noon, and at bedtime. (Patient not taking: Reported on 03/16/2024)   No facility-administered encounter medications on file as of 03/16/2024.    Vitals:   03/16/24 1007  BP: 117/76  Pulse: 71  Temp: (!) 97.4 F (36.3 C)  Height: 5' 9 (1.753 m)  Weight: 188 lb (85.3 kg)  SpO2: 97%  TempSrc: Oral  BMI (Calculated): 27.75    Physical Exam GEN: No acute distress CV: Regular rate and rhythm no murmurs LUNGS: Clear to auscultation bilaterally normal respiratory effort SKIN JOINTS: Warm and dry no rash    Data Reviewed: Imaging: Cardiac CT scan 09/02/2022-widespread areas of groundglass attenuation with septal thickening with bronchiectasis at the base.  High  resolution CT 11/04/2022-interstitial lung disease with patchy areas of groundglass, septal thickening, mild cylindrical bronchiectasis.  Probable UIP pattern. I reviewed the images personally  PFTs: 01/07/2023 FVC 3.84 [93%], FEV1 1.41 [47%], F/F37, TLC 5.73 [83%], DLCO 18.40 [95% Severe obstruction.  Overall spirometry is unreliable due to poor expiratory effort Mild diffusion defect  12/10/2023 FVC 4.15 [102%], FEV1 3.36 [114%], F/F81, TLC 6.44 [94%], DLCO 16.49 [67%] Mild diffusion defect  Labs: CTD serology 10/07/2022-negative  Assessment & Plan Idiopathic pulmonary fibrosis CT shows mild changes in probable UIP pattern. There are no signs and symptoms of connective tissue disease or any exposure history.  CTD serologies are negative. This is likely to be IPF  CT scan from July shows stable scarring compared to last year, but recent pulmonary function tests indicate a mild reduction in lung function. Diagnosis is by exclusion, with autoimmune diseases and exposures ruled out. Biopsy is not needed. Current treatment with pirfenidone  is aimed at delaying progression and reducing lung capacity loss. Liver function tests are stable, indicating no significant hepatic impact from the medication. - Continue pirfenidone  as prescribed. - Scheduled follow-up in three months to reassess condition and medication tolerance. - Plan for CT in July or August.  Diarrhea secondary to pirfenidone  Diarrhea as a side effect of pirfenidone , impacting daily activities due to frequent travel. Alternatives may cause similar or worse diarrhea. He prefers to continue current medication despite side effects. - Prescribed Imodium for symptomatic relief of diarrhea. - Recommended increasing dietary fiber intake through vegetables and fiber supplements like Benefiber. - Discussed potential dose reduction of pirfenidone  if diarrhea becomes unmanageable, though not generally recommended.  Active smoker Discussed  smoking cessation.  He wants to quit on his own Reassess at return visit.  Time spent counseling-5 minutes  PFT showed severe obstruction but may be over estimated due to patient effort. Repeat PFTs do not show the obstruction  Plan/Recommendations: Continue pirfenidone  Imodium, dietary fiber for diarrhea Smoking cessation  I personally spent a total of 40 minutes in the care of the patient today including preparing to see the patient, getting/reviewing separately obtained history, performing a medically appropriate exam/evaluation, counseling and educating, referring and communicating with other health care professionals, documenting clinical information in the EHR, independently interpreting results, and communicating results.   Lonna Coder MD [facility] 03/16/2024, 10:23 AM  CC: [name], MD

## 2024-03-16 NOTE — Patient Instructions (Addendum)
  VISIT SUMMARY: You visited us  today to discuss your idiopathic pulmonary fibrosis and the medication-related diarrhea you are experiencing. We reviewed your current treatment plan and made some adjustments to help manage your symptoms.  YOUR PLAN: IDIOPATHIC PULMONARY FIBROSIS: A chronic lung condition that causes scarring and gradual deterioration of lung function. You are currently taking pirfenidone  to help slow the progression of this disease. -Continue taking pirfenidone  as prescribed. -We will have a follow-up appointment in three months to reassess your condition and how well you are tolerating the medication. -Plan for a CT scan in July monitor your lung condition.  DIARRHEA SECONDARY TO PIRFENIDONE : You are experiencing diarrhea as a side effect of your medication, which is affecting your daily activities. -Take Imodium as needed for relief from diarrhea. -Increase your dietary fiber intake by eating more vegetables and using fiber supplements like Benefiber. -If the diarrhea becomes unmanageable, we may consider reducing the dose of pirfenidone , although this is not generally recommended.

## 2024-03-16 NOTE — Progress Notes (Signed)
 Michael Ashley    [id number]    [birth year]  Primary Care Physician:Jones, Debby CROME, MD  Referring Physician: [name], MD [address] [place],  [place] [address]  Chief complaint: Follow up for interstitial lung disease  HPI: 73 y.o. who  has a past medical history of Abnormality of gait, Diabetes mellitus without complication (HCC), Diabetic neuropathy (HCC), Hypertension, Lumbar radiculopathy, and Spinal stenosis.   Referred here for abnormal cardiac CT which showed mild interstitial changes.  He denies any symptoms.  No cough, sputum production, dyspnea on exertion Does not have any joint pain, rash or difficulty swallowing, dry mouth, dry eyes   Interim history: Discussed the use of AI scribe software for clinical note transcription with the patient, who gave verbal consent to proceed.  History of Present Illness Michael Ashley is a 73 year old male with idiopathic pulmonary fibrosis who presents for follow-up of his lung condition.  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  No feather pillows or comforters ILD questionnaire 10/07/2022-negative Smoking history: Smoked 5 cigarettes a day since 1968 Travel history: Originally from [place].  Moved to the states in the 1980s.  No significant recent travel Relevant family history: No family history  of lung disease  Outpatient Encounter Medications as of 03/16/2024  Medication Sig   aspirin  EC 81 MG tablet Take 1 tablet (81 mg total) by mouth daily. Swallow whole.   atorvastatin  (LIPITOR) 20 MG tablet Take 1 tablet by mouth once daily   Continuous Glucose Sensor (FREESTYLE LIBRE 3 PLUS SENSOR) MISC Change sensor every 15 days.   FARXIGA  10 MG TABS tablet Take 1 tablet by mouth once daily   gabapentin  (NEURONTIN ) 300 MG capsule Take 1 capsule (300 mg total) by mouth 2 (two) times daily.   insulin  glargine, 2 Unit Dial, (TOUJEO  MAX SOLOSTAR) 300 UNIT/ML Solostar Pen Inject 30 Units into the skin daily.   Insulin  Pen Needle (NOVOFINE PEN NEEDLE) 32G X 6 MM MISC USE 1  PEN NEEDLE ONCE DAILY   metFORMIN  (GLUCOPHAGE -XR) 750 MG 24 hr tablet Take 2 tablets (1,500 mg total) by mouth daily with breakfast.   potassium chloride  (KLOR-CON ) 10 MEQ tablet TAKE 1 TABLET BY MOUTH THREE TIMES DAILY   Semaglutide ,0.25 or 0.5MG /DOS, (OZEMPIC , 0.25 OR 0.5 MG/DOSE,) 2 MG/3ML SOPN INJECT 0.25MG  WEEKLY FOR 4 WEEKS, THEN INCREASE TO 0.5MG  WEEKLY THEREAFTER.   Pirfenidone  267 MG TABS Take 1 tab three times daily for 7 days, then 2 tabs three times daily for 7 days, then 3 tabs three times daily thereafter. Take with a full meal. (Patient not taking: Reported on 03/16/2024)   Pirfenidone  267 MG TABS Take 3 tablets (801 mg total) by mouth in the morning, at noon, and at bedtime. (Patient not taking: Reported on 03/16/2024)   No facility-administered encounter medications on file as of 03/16/2024.    Vitals:   03/16/24 1007  BP: 117/76  Pulse: 71  Temp: (!) 97.4 F (36.3 C)  Height: 5' 9 (1.753 m)  Weight: 188 lb (85.3 kg)  SpO2: 97%  TempSrc: Oral  BMI (Calculated): 27.75    Physical Exam GEN: No acute distress CV: Regular rate and rhythm no murmurs LUNGS: Clear to auscultation bilaterally normal respiratory effort SKIN JOINTS: Warm and dry no rash    Data Reviewed: Imaging: Cardiac CT scan  09/02/2022-widespread areas of groundglass attenuation with septal thickening with bronchiectasis at the base.  High resolution CT 11/04/2022-interstitial lung disease with patchy areas of groundglass, septal thickening, mild cylindrical bronchiectasis.  Probable UIP pattern. I reviewed the images personally  PFTs: 01/07/2023 FVC 3.84 [93%], FEV1 1.41 [47%], F/F37, TLC 5.73 [83%], DLCO 18.40 [95% Severe obstruction.  Overall spirometry is unreliable due to poor expiratory effort Mild diffusion defect  12/10/2023 FVC 4.15 [102%], FEV1 3.36 [114%], F/F81, TLC 6.44 [94%], DLCO 16.49 [67%] Mild diffusion defect  Labs: CTD serology 10/07/2022-negative  Assessment & Plan Idiopathic pulmonary fibrosis CT shows mild changes in probable UIP pattern. There are no signs and symptoms of connective tissue disease or any exposure history.  CTD serologies are negative. This is likely to be IPF  CT scan from July shows stable scarring compared to last year, but recent pulmonary function tests indicate a mild reduction in lung function. Diagnosis is by exclusion, with autoimmune diseases and exposures ruled out. Biopsy is unnecessary. The condition is progressive, potentially leading to decreased oxygenation and respiratory failure. Treatment options include medications that slow scarring progression, though not curative. Discussed side effects include liver effects, stomach upset, diarrhea, and photosensitivity. Shared decision-making led to starting treatment to slow disease progression due to recent lung function decline. Without treatment, breathing may worsen, potentially requiring oxygen and leading to mortality. He understands the need to start treatment to preserve lung function. - Initiate application for insurance approval for  pirfenidone  - Start patient assistance process for medication access. - Schedule an appointment with the pharmacy team for medication education. - Monitor liver function tests,  recent tests were normal. - Schedule follow-up in three months to assess treatment response.  Active smoker Discussed smoking cessation.  He wants to quit on his own Reassess at return visit.  Time spent counseling-5 minutes  PFT showed severe obstruction but may be over estimated due to patient effort. Repeat PFTs do not show the obstructionb  Plan/Recommendations: Start pirfenidone  Smoking cessation  Chakara Bognar MD [facility] 03/16/2024, 10:26 AM  CC: [name], MD

## 2024-03-17 ENCOUNTER — Other Ambulatory Visit: Payer: Self-pay | Admitting: Internal Medicine

## 2024-03-20 ENCOUNTER — Other Ambulatory Visit: Payer: Self-pay

## 2024-03-22 ENCOUNTER — Other Ambulatory Visit (HOSPITAL_COMMUNITY): Payer: Self-pay

## 2024-03-24 ENCOUNTER — Other Ambulatory Visit: Payer: Self-pay

## 2024-03-24 ENCOUNTER — Other Ambulatory Visit (HOSPITAL_COMMUNITY): Payer: Self-pay

## 2024-03-27 ENCOUNTER — Other Ambulatory Visit: Payer: Self-pay

## 2024-04-20 ENCOUNTER — Other Ambulatory Visit: Payer: Self-pay

## 2024-04-21 ENCOUNTER — Other Ambulatory Visit: Payer: Self-pay | Admitting: Medical Genetics

## 2024-04-25 ENCOUNTER — Ambulatory Visit: Payer: Self-pay | Admitting: Internal Medicine

## 2024-04-25 ENCOUNTER — Encounter: Payer: Self-pay | Admitting: Internal Medicine

## 2024-04-25 ENCOUNTER — Ambulatory Visit: Admitting: Internal Medicine

## 2024-04-25 ENCOUNTER — Other Ambulatory Visit: Payer: Self-pay | Admitting: Internal Medicine

## 2024-04-25 VITALS — BP 138/76 | HR 81 | Temp 98.5°F | Resp 16 | Ht 69.0 in | Wt 189.8 lb

## 2024-04-25 DIAGNOSIS — E1140 Type 2 diabetes mellitus with diabetic neuropathy, unspecified: Secondary | ICD-10-CM | POA: Diagnosis not present

## 2024-04-25 DIAGNOSIS — N1831 Chronic kidney disease, stage 3a: Secondary | ICD-10-CM

## 2024-04-25 DIAGNOSIS — E118 Type 2 diabetes mellitus with unspecified complications: Secondary | ICD-10-CM

## 2024-04-25 DIAGNOSIS — D539 Nutritional anemia, unspecified: Secondary | ICD-10-CM

## 2024-04-25 DIAGNOSIS — I10 Essential (primary) hypertension: Secondary | ICD-10-CM | POA: Diagnosis not present

## 2024-04-25 DIAGNOSIS — E876 Hypokalemia: Secondary | ICD-10-CM

## 2024-04-25 DIAGNOSIS — E1122 Type 2 diabetes mellitus with diabetic chronic kidney disease: Secondary | ICD-10-CM | POA: Diagnosis not present

## 2024-04-25 DIAGNOSIS — Z794 Long term (current) use of insulin: Secondary | ICD-10-CM | POA: Diagnosis not present

## 2024-04-25 DIAGNOSIS — J849 Interstitial pulmonary disease, unspecified: Secondary | ICD-10-CM

## 2024-04-25 DIAGNOSIS — E1142 Type 2 diabetes mellitus with diabetic polyneuropathy: Secondary | ICD-10-CM

## 2024-04-25 DIAGNOSIS — E119 Type 2 diabetes mellitus without complications: Secondary | ICD-10-CM | POA: Diagnosis not present

## 2024-04-25 DIAGNOSIS — Z125 Encounter for screening for malignant neoplasm of prostate: Secondary | ICD-10-CM

## 2024-04-25 DIAGNOSIS — E785 Hyperlipidemia, unspecified: Secondary | ICD-10-CM | POA: Diagnosis not present

## 2024-04-25 LAB — CBC WITH DIFFERENTIAL/PLATELET
Basophils Absolute: 0 K/uL (ref 0.0–0.1)
Basophils Relative: 0.6 % (ref 0.0–3.0)
Eosinophils Absolute: 0.2 K/uL (ref 0.0–0.7)
Eosinophils Relative: 4.3 % (ref 0.0–5.0)
HCT: 38.3 % — ABNORMAL LOW (ref 39.0–52.0)
Hemoglobin: 12.8 g/dL — ABNORMAL LOW (ref 13.0–17.0)
Lymphocytes Relative: 20.9 % (ref 12.0–46.0)
Lymphs Abs: 1 K/uL (ref 0.7–4.0)
MCHC: 33.5 g/dL (ref 30.0–36.0)
MCV: 94.5 fl (ref 78.0–100.0)
Monocytes Absolute: 0.4 K/uL (ref 0.1–1.0)
Monocytes Relative: 7.9 % (ref 3.0–12.0)
Neutro Abs: 3.2 K/uL (ref 1.4–7.7)
Neutrophils Relative %: 66.3 % (ref 43.0–77.0)
Platelets: 194 K/uL (ref 150.0–400.0)
RBC: 4.05 Mil/uL — ABNORMAL LOW (ref 4.22–5.81)
RDW: 15 % (ref 11.5–15.5)
WBC: 4.8 K/uL (ref 4.0–10.5)

## 2024-04-25 LAB — HEMOGLOBIN A1C: Hgb A1c MFr Bld: 7.3 % — ABNORMAL HIGH (ref 4.6–6.5)

## 2024-04-25 LAB — BASIC METABOLIC PANEL WITH GFR
BUN: 15 mg/dL (ref 6–23)
CO2: 27 meq/L (ref 19–32)
Calcium: 9.3 mg/dL (ref 8.4–10.5)
Chloride: 106 meq/L (ref 96–112)
Creatinine, Ser: 1.05 mg/dL (ref 0.40–1.50)
GFR: 69.48 mL/min
Glucose, Bld: 201 mg/dL — ABNORMAL HIGH (ref 70–99)
Potassium: 4.4 meq/L (ref 3.5–5.1)
Sodium: 142 meq/L (ref 135–145)

## 2024-04-25 LAB — LIPID PANEL
Cholesterol: 105 mg/dL (ref 28–200)
HDL: 41.7 mg/dL
LDL Cholesterol: 39 mg/dL (ref 10–99)
NonHDL: 63.52
Total CHOL/HDL Ratio: 3
Triglycerides: 121 mg/dL (ref 10.0–149.0)
VLDL: 24.2 mg/dL (ref 0.0–40.0)

## 2024-04-25 LAB — TSH: TSH: 1.65 u[IU]/mL (ref 0.35–5.50)

## 2024-04-25 LAB — PSA: PSA: 1.04 ng/mL (ref 0.10–4.00)

## 2024-04-25 NOTE — Progress Notes (Unsigned)
 "  Subjective:  Patient ID: Michael Ashley, male    DOB: 02/12/1951  Age: 73 y.o. MRN: [id number]  CC: Diabetes (A1c check )   HPI Michael Ashley presents for f/up --  Discussed the use of AI scribe software for clinical note transcription with the patient, who gave verbal consent to proceed.  History of Present Illness Michael Ashley is a 73 year old male who presents for follow-up regarding a lung scar and medication side effects.  He has a scar on his lungs identified during a previous examination. 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 Consequently, he stopped taking the medication. He reports no coughing, wheezing, or shortness of breath. No chest pain, dizziness, or lightheadedness.  He is currently taking Ozempic  and insulin  to manage his blood sugar levels. He notes a significant weight loss from 205-208 pounds to 188 pounds, which he attributes to the use of Ozempic . He wants to know his A1c levels to monitor his diabetes management.  He recently had an eye exam, which was normal, and he is scheduled to return in a year. 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     Outpatient Medications Prior to Visit  Medication Sig Dispense Refill   aspirin  EC 81 MG tablet Take 1 tablet (81 mg total) by mouth daily. Swallow whole. 30 tablet 12   atorvastatin  (LIPITOR) 20 MG tablet Take 1 tablet by mouth once daily 90 tablet 0   Continuous Glucose Sensor (FREESTYLE LIBRE 3 PLUS SENSOR) MISC Change sensor every 15 days. 2 each 5   FARXIGA  10 MG TABS tablet Take 1 tablet by mouth once daily 90 tablet 0   gabapentin  (NEURONTIN ) 300 MG capsule Take 1 capsule by mouth twice daily 180 capsule 0   insulin  glargine, 2 Unit Dial, (TOUJEO  MAX SOLOSTAR) 300 UNIT/ML Solostar Pen Inject 30 Units into the skin daily. 12 mL 0   Insulin  Pen Needle (NOVOFINE PEN NEEDLE) 32G X 6 MM MISC USE 1  PEN NEEDLE ONCE DAILY 100 each 5    loperamide  (IMODIUM ) 2 MG capsule Take 1 capsule (2 mg total) by mouth as needed for diarrhea or loose stools. 30 capsule 3   metFORMIN  (GLUCOPHAGE -XR) 750 MG 24 hr tablet Take 2 tablets (1,500 mg total) by mouth daily with breakfast. 180 tablet 0   potassium chloride  (KLOR-CON ) 10 MEQ tablet TAKE 1 TABLET BY MOUTH THREE TIMES DAILY 270 tablet 0   Semaglutide ,0.25 or 0.5MG /DOS, (OZEMPIC , 0.25 OR 0.5 MG/DOSE,) 2 MG/3ML SOPN INJECT 0.25MG  WEEKLY FOR 4 WEEKS, THEN INCREASE TO 0.5MG  WEEKLY THEREAFTER. 3 mL 0   Pirfenidone  267 MG TABS Take 1 tab three times daily for 7 days, then 2 tabs three times daily for 7 days, then 3 tabs three times daily thereafter. Take with a full meal. (Patient not taking: Reported on 04/25/2024) 207 tablet 0   Pirfenidone  267 MG TABS Take 3 tablets (801 mg total) by mouth in the morning, at noon, and at bedtime. (Patient not taking: Reported on 04/25/2024) 270 tablet 2   No facility-administered medications prior to visit.    ROS Review of Systems  Constitutional:  Negative for appetite change, chills, diaphoresis, fatigue and fever.  HENT: Negative.    Eyes: Negative.   Respiratory: Negative.  Negative for cough, chest tightness, shortness of breath and wheezing.   Cardiovascular:  Negative for chest pain, palpitations and leg swelling.  Gastrointestinal: Negative.  Negative for abdominal pain, constipation, diarrhea, nausea and vomiting.  Endocrine: Negative.   Genitourinary: Negative.  Negative for difficulty urinating and dysuria.  Musculoskeletal: Negative.  Negative for arthralgias, joint swelling and myalgias.  Skin: Negative.   Neurological:  Negative for dizziness, weakness, light-headedness and headaches.  Hematological:  Negative for adenopathy. Does not bruise/bleed easily.  Psychiatric/Behavioral: Negative.      Objective:  BP 138/76 (BP Location: Left Arm, Patient Position: Sitting, Cuff Size: Normal)   Pulse 81   Temp 98.5 F (36.9 C) (Oral)    Resp 16   Ht 5' 9 (1.753 m)   Wt 189 lb 12.8 oz (86.1 kg)   SpO2 95%   BMI 28.03 kg/m   BP Readings from Last 3 Encounters:  04/25/24 138/76  03/16/24 117/76  12/24/23 112/69    Wt Readings from Last 3 Encounters:  04/25/24 189 lb 12.8 oz (86.1 kg)  03/16/24 188 lb (85.3 kg)  12/24/23 204 lb (92.5 kg)    Physical Exam Vitals reviewed.  Constitutional:      Appearance: Normal appearance.  HENT:     Nose: Nose normal.     Mouth/Throat:     Mouth: Mucous membranes are moist.  Eyes:     General: No scleral icterus.    Conjunctiva/sclera: Conjunctivae normal.  Cardiovascular:     Rate and Rhythm: Normal rate and regular rhythm.     Heart sounds: No murmur heard.    No friction rub. No gallop.  Pulmonary:     Effort: Pulmonary effort is normal. No tachypnea.     Breath sounds: Normal breath sounds. No wheezing, rhonchi or rales.  Abdominal:     General: Abdomen is flat.     Palpations: There is no mass.     Tenderness: There is no abdominal tenderness. There is no guarding.     Hernia: No hernia is present.  Musculoskeletal:        General: Normal range of motion.     Cervical back: Neck supple.     Right lower leg: No edema.     Left lower leg: No edema.  Lymphadenopathy:     Cervical: No cervical adenopathy.  Skin:    General: Skin is warm and dry.  Neurological:     General: No focal deficit present.     Mental Status: He is alert.  Psychiatric:        Mood and Affect: Mood normal.        Behavior: Behavior normal.     Lab Results  Component Value Date   WBC 4.8 04/25/2024   HGB 12.8 (L) 04/25/2024   HCT 38.3 (L) 04/25/2024   PLT 194.0 04/25/2024   GLUCOSE 201 (H) 04/25/2024   CHOL 105 04/25/2024   TRIG 121.0 04/25/2024   HDL 41.70 04/25/2024   LDLDIRECT 84.0 10/10/2015   LDLCALC 39 04/25/2024   ALT 26 03/14/2024   AST 25 03/14/2024   NA 142 04/25/2024   K 4.4 04/25/2024   CL 106 04/25/2024   CREATININE 1.05 04/25/2024   BUN 15 04/25/2024    CO2 27 04/25/2024   TSH 1.65 04/25/2024   PSA 1.04 04/25/2024   HGBA1C 7.3 (H) 04/25/2024   MICROALBUR 2.0 (H) 11/15/2023    CT CHEST HIGH RESOLUTION Result Date: 11/10/2023 CLINICAL DATA:  Interstitial lung disease, difficulty breathing EXAM: CT CHEST WITHOUT CONTRAST TECHNIQUE: Multidetector CT imaging of the chest was performed following the standard protocol without intravenous contrast. High resolution imaging  of the lungs, as well as inspiratory and expiratory imaging, was performed. RADIATION DOSE REDUCTION: This exam was performed according to the departmental dose-optimization program which includes automated exposure control, adjustment of the mA and/or kV according to patient size and/or use of iterative reconstruction technique. COMPARISON:  Chest CT November 04, 2022 FINDINGS: Cardiovascular: The heart size is normal. Atherosclerotic calcifications of coronary arteries. Ascending aorta is dilated measuring 4.2 cm. No pericardial fluid. Mediastinum/Nodes: A few subcentimeter mediastinal lymph nodes Lungs/Pleura: Lower lobe predominant perihilar interstitial changes, mild interlobular septal thickening, subpleural mild architectural distortion without significant honeycombing, mild bronchiectasis and bronchiolectasis, stable to prior. New right lower lobe micronodule (11/245). No consolidation, air trapping, or pleural effusion. Upper Abdomen: Cholelithiasis. Cirrhotic liver contour. Atrophic changes of the pancreas with dilation of the pancreatic duct. Musculoskeletal: No suspicious osseous lesion. 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 Am JINNY [employer] Crit Care Med Vol 198, Iss 5, [phone], Jan 02 2017. New right lower lobe pulmonary micronodule. No follow-up needed if patient is low-risk.This recommendation follows the consensus  statement: Guidelines for Management of Incidental Pulmonary Nodules Detected on CT Images: From the Fleischner Society 2017; Radiology 2017; 284:228-243. 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 Aortic and coronary artery atherosclerosis (ICD10-I70.0). Electronically Signed   By: Megan  Zare M.D.   On: 11/10/2023 16:07    Estimated Creatinine Clearance: 66.1 mL/min (by C-G formula based on SCr of 1.05 mg/dL).   Assessment & Plan:  Painful diabetic neuropathy (HCC)  Diabetic polyneuropathy associated with type 2 diabetes mellitus (HCC)  Type 2 diabetes mellitus with stage 3a chronic kidney disease, with long-term current use of insulin  (HCC) -     Hemoglobin A1c; Future -     HM Diabetes Foot Exam -     Basic metabolic panel with GFR; Future  Essential hypertension, benign -     TSH; Future -     Basic metabolic panel with GFR; Future -     CBC with Differential/Platelet; Future  Hyperlipidemia with target LDL less than 70 -     TSH; Future -     Lipid panel; Future  ILD (interstitial lung disease) (HCC)  Prostate cancer screening -     PSA; Future  Deficiency anemia     Follow-up: Return in about 6 months (around 10/24/2024).  Debby Molt, MD "

## 2024-04-25 NOTE — Patient Instructions (Signed)

## 2024-04-26 DIAGNOSIS — D539 Nutritional anemia, unspecified: Secondary | ICD-10-CM | POA: Insufficient documentation

## 2024-04-28 ENCOUNTER — Other Ambulatory Visit (HOSPITAL_COMMUNITY): Payer: Self-pay

## 2024-05-01 ENCOUNTER — Other Ambulatory Visit: Payer: Self-pay | Admitting: Internal Medicine

## 2024-05-01 DIAGNOSIS — E118 Type 2 diabetes mellitus with unspecified complications: Secondary | ICD-10-CM

## 2024-05-02 ENCOUNTER — Other Ambulatory Visit (HOSPITAL_COMMUNITY): Payer: Self-pay

## 2024-05-02 MED ORDER — METFORMIN HCL ER 750 MG PO TB24: 1500.0000 mg | ORAL_TABLET | Freq: Every day | ORAL | 0 refills | Status: AC

## 2024-05-02 MED ORDER — TOUJEO MAX SOLOSTAR 300 UNIT/ML ~~LOC~~ SOPN: 30.0000 [IU] | PEN_INJECTOR | Freq: Every day | SUBCUTANEOUS | 0 refills | Status: AC

## 2024-05-02 MED ORDER — ATORVASTATIN CALCIUM 20 MG PO TABS: 20.0000 mg | ORAL_TABLET | Freq: Every day | ORAL | 0 refills | Status: AC

## 2024-05-05 ENCOUNTER — Other Ambulatory Visit: Payer: Self-pay

## 2024-06-22 ENCOUNTER — Ambulatory Visit: Admitting: Pulmonary Disease

## 2024-09-26 ENCOUNTER — Ambulatory Visit

## 2024-09-26 ENCOUNTER — Encounter: Admitting: Internal Medicine
